# Patient Record
Sex: Male | Born: 1940 | Race: White | Hispanic: No | Marital: Married | Smoking: Former smoker
Health system: Southern US, Community
[De-identification: ages and names within clinical notes are randomized; demographics above are authoritative.]

## PROBLEM LIST (undated history)

## (undated) DIAGNOSIS — M47812 Spondylosis without myelopathy or radiculopathy, cervical region: Secondary | ICD-10-CM

## (undated) DIAGNOSIS — E66811 Obesity, class 1: Secondary | ICD-10-CM

## (undated) DIAGNOSIS — I714 Abdominal aortic aneurysm, without rupture, unspecified: Secondary | ICD-10-CM

## (undated) DIAGNOSIS — H269 Unspecified cataract: Secondary | ICD-10-CM

## (undated) DIAGNOSIS — S0572XA Avulsion of left eye, initial encounter: Secondary | ICD-10-CM

## (undated) DIAGNOSIS — E785 Hyperlipidemia, unspecified: Secondary | ICD-10-CM

## (undated) DIAGNOSIS — L570 Actinic keratosis: Secondary | ICD-10-CM

## (undated) DIAGNOSIS — Z7289 Other problems related to lifestyle: Secondary | ICD-10-CM

## (undated) DIAGNOSIS — J302 Other seasonal allergic rhinitis: Secondary | ICD-10-CM

## (undated) DIAGNOSIS — F1090 Alcohol use, unspecified, uncomplicated: Secondary | ICD-10-CM

## (undated) DIAGNOSIS — H9193 Unspecified hearing loss, bilateral: Secondary | ICD-10-CM

## (undated) DIAGNOSIS — E669 Obesity, unspecified: Secondary | ICD-10-CM

## (undated) DIAGNOSIS — Z789 Other specified health status: Secondary | ICD-10-CM

## (undated) DIAGNOSIS — T7840XA Allergy, unspecified, initial encounter: Secondary | ICD-10-CM

## (undated) HISTORY — DX: Hyperlipidemia, unspecified: E78.5

## (undated) HISTORY — DX: Obesity, class 1: E66.811

## (undated) HISTORY — DX: Other specified health status: Z78.9

## (undated) HISTORY — DX: Allergy, unspecified, initial encounter: T78.40XA

## (undated) HISTORY — DX: Spondylosis without myelopathy or radiculopathy, cervical region: M47.812

## (undated) HISTORY — PX: INGUINAL HERNIA REPAIR: SUR1180

## (undated) HISTORY — DX: Unspecified hearing loss, bilateral: H91.93

## (undated) HISTORY — DX: Obesity, unspecified: E66.9

## (undated) HISTORY — DX: Abdominal aortic aneurysm, without rupture, unspecified: I71.40

## (undated) HISTORY — DX: Other seasonal allergic rhinitis: J30.2

## (undated) HISTORY — DX: Other problems related to lifestyle: Z72.89

## (undated) HISTORY — DX: Alcohol use, unspecified, uncomplicated: F10.90

## (undated) HISTORY — DX: Actinic keratosis: L57.0

## (undated) HISTORY — DX: Avulsion of left eye, initial encounter: S05.72XA

## (undated) HISTORY — DX: Unspecified cataract: H26.9

## (undated) HISTORY — DX: Abdominal aortic aneurysm, without rupture: I71.4

---
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
## 1967-11-15 HISTORY — PX: ENUCLEATION: SHX628

## 2001-12-24 ENCOUNTER — Ambulatory Visit (HOSPITAL_COMMUNITY): Admission: RE | Admit: 2001-12-24 | Discharge: 2001-12-24 | Payer: Self-pay | Admitting: *Deleted

## 2003-11-15 HISTORY — PX: MENISCUS REPAIR: SHX5179

## 2004-01-20 ENCOUNTER — Ambulatory Visit (HOSPITAL_BASED_OUTPATIENT_CLINIC_OR_DEPARTMENT_OTHER): Admission: RE | Admit: 2004-01-20 | Discharge: 2004-01-20 | Payer: Self-pay | Admitting: Plastic Surgery

## 2004-01-20 ENCOUNTER — Ambulatory Visit (HOSPITAL_COMMUNITY): Admission: RE | Admit: 2004-01-20 | Discharge: 2004-01-20 | Payer: Self-pay | Admitting: Plastic Surgery

## 2004-01-20 ENCOUNTER — Encounter (INDEPENDENT_AMBULATORY_CARE_PROVIDER_SITE_OTHER): Payer: Self-pay | Admitting: *Deleted

## 2004-11-14 HISTORY — PX: VARICOSE VEIN SURGERY: SHX832

## 2005-04-08 ENCOUNTER — Ambulatory Visit (HOSPITAL_COMMUNITY): Admission: RE | Admit: 2005-04-08 | Discharge: 2005-04-08 | Payer: Self-pay | Admitting: Orthopedic Surgery

## 2007-04-11 ENCOUNTER — Ambulatory Visit (HOSPITAL_COMMUNITY): Admission: RE | Admit: 2007-04-11 | Discharge: 2007-04-11 | Payer: Self-pay | Admitting: *Deleted

## 2009-07-24 DIAGNOSIS — D239 Other benign neoplasm of skin, unspecified: Secondary | ICD-10-CM

## 2009-07-24 HISTORY — DX: Other benign neoplasm of skin, unspecified: D23.9

## 2010-02-02 ENCOUNTER — Encounter: Admission: RE | Admit: 2010-02-02 | Discharge: 2010-02-02 | Payer: Self-pay | Admitting: Internal Medicine

## 2010-02-05 ENCOUNTER — Ambulatory Visit: Admission: RE | Admit: 2010-02-05 | Discharge: 2010-02-05 | Payer: Self-pay | Admitting: Internal Medicine

## 2010-11-26 ENCOUNTER — Ambulatory Visit
Admission: RE | Admit: 2010-11-26 | Discharge: 2010-11-26 | Payer: Self-pay | Source: Home / Self Care | Attending: Surgery | Admitting: Surgery

## 2010-11-29 LAB — POCT HEMOGLOBIN-HEMACUE: Hemoglobin: 16.2 g/dL (ref 13.0–17.0)

## 2010-12-05 NOTE — Op Note (Signed)
NAME:  Patrick Sawyer, Patrick Sawyer          ACCOUNT NO.:  [id number]  MEDICAL RECORD NO.:  [id number]          PATIENT TYPE:  AMB  LOCATION:  DSC                          FACILITY:  [facility]  PHYSICIAN:  Velora Heckler, MD      DATE OF BIRTH:  03-21-1941  DATE OF PROCEDURE:  11/26/2010 DATE OF DISCHARGE:                              OPERATIVE REPORT   PREOPERATIVE DIAGNOSIS:  Right inguinal hernia.  POSTOPERATIVE DIAGNOSIS:  Right inguinal hernia.  PROCEDURE:  Repair of right inguinal hernia with Ethicon Ultrapro mesh.  SURGEON:  Velora Heckler, MD  ANESTHESIA:  General per Dr. Judie Petit.  ESTIMATED BLOOD LOSS:  Minimal.  PREPARATION:  ChloraPrep.  COMPLICATIONS:  None.  INDICATIONS:  The patient is a 70 year old white male from [place], [place].  He presents with right groin pain intermittently for approximately 2 years.  He was found on physical exam to have a reducible right inguinal hernia.  He now comes to surgery for repair.  BODY OF REPORT:  Procedure was done in OR #4 at the [facility].  The patient was brought to the operating room, placed in a supine position on the operating room table.  Following administration of general anesthesia, the patient was prepped and draped in the usual strict aseptic fashion.  After ascertaining that an adequate level of anesthesia had been achieved, a right groin incision was made with a #15 blade.  Dissection was carried through subcutaneous tissues and hemostasis obtained with electrocautery.  External oblique fascia was incised in line with its fibers and extended through the external inguinal ring.  Cord structures were dissected out the inguinal canal and encircled with a Penrose drain.  Floor of the inguinal canal was carefully dissected out.  Cord was explored.  There was a moderate lipoma of the cord, which was excised.  There was an indirect inguinal hernia sac, which was opened.  It contains fatty  tissue, which was reduced back within the peritoneal cavity.  Hernia sac was then closed with a high ligation with 2-0 silk suture ligature.  The sac was excised and discarded.  Next the floor of the inguinal canal was reinforced with a sheet of Ethicon Ultrapro mesh.  Mesh was cut to the appropriate dimensions.  It was secured to the pubic tubercle and along the inguinal ligament with a running 2-0 Novafil suture.  Mesh was split to accommodate the cord structures.  Superior margin of the mesh was secured to the transversalis and internal oblique fascia with interrupted 2-0 Novafil sutures.  Tails of mesh were overlapped lateral to cord structures and secured to the inguinal ligament with interrupted 2-0 Novafil sutures. Local field block was placed with Marcaine.  Cord structures were returned to the inguinal canal.  External oblique fascia was closed with interrupted 3-0 Vicryl sutures.  Subcutaneous tissues were closed with interrupted 3-0 Vicryl sutures.  Skin was anesthetized with local anesthetic.  Skin edges were reapproximated with a running 4-0 Monocryl subcuticular suture.  Wound was washed and dried and Benzoin and Steri-Strips were applied.  Sterile dressings were applied.  The patient was awakened from anesthesia and brought to  the recovery room.  The patient tolerated the procedure well.     Velora Heckler, MD     TMG/MEDQ  D:  11/26/2010  T:  11/27/2010  Job:  [id number]  Electronically Signed by Darnell Level MD on 12/05/2010 10:00:40 AM

## 2011-01-31 ENCOUNTER — Other Ambulatory Visit: Payer: Self-pay | Admitting: Internal Medicine

## 2011-01-31 DIAGNOSIS — R911 Solitary pulmonary nodule: Secondary | ICD-10-CM

## 2011-02-07 ENCOUNTER — Ambulatory Visit
Admission: RE | Admit: 2011-02-07 | Discharge: 2011-02-07 | Disposition: A | Discharge status: Home / Self Care | Payer: Medicare Other | Source: Ambulatory Visit | Attending: Internal Medicine | Admitting: Internal Medicine

## 2011-02-07 DIAGNOSIS — R911 Solitary pulmonary nodule: Secondary | ICD-10-CM

## 2011-03-29 NOTE — Op Note (Signed)
NAME:  Patrick Sawyer, Patrick Sawyer NO.:  [id number]   MEDICAL RECORD NO.:  [id number]          PATIENT TYPE:  AMB   LOCATION:  ENDO                         FACILITY:  [facility]   PHYSICIAN:  Georgiana Spinner, M.D.    DATE OF BIRTH:  18-May-1941   DATE OF PROCEDURE:  04/11/2007  DATE OF DISCHARGE:                               OPERATIVE REPORT   PROCEDURE:  Colonoscopy.   INDICATIONS:  Colon cancer screening.   ANESTHESIA:  Demerol 80, Versed 10 mg.   PROCEDURE:  With the patient mildly sedated in the left lateral  decubitus position the Pentax videoscopic colonoscope was inserted the  rectum after normal rectal examination was performed and passed under  direct vision to the cecum identified by the ileocecal valve and  appendiceal orifice both of which were photographed.  From this point  the colonoscope was slowly withdrawn taking circumferential views of  colonic mucosa stopping only in the rectum which appeared normal on  direct and showed hemorrhoids on retroflexed view.  The endoscope was  straightened and withdrawn.  The patient's vital signs, pulse oximeter  remained stable.  The patient tolerated procedure well without apparent  complications.   FINDINGS:  Internal hemorrhoids otherwise unremarkable exam.   PLAN:  Repeat examination in 5 years.           ______________________________  Georgiana Spinner, M.D.     GMO/MEDQ  D:  04/11/2007  T:  04/11/2007  Job:  [id number]

## 2011-04-01 NOTE — Op Note (Signed)
NAME:  Patrick Sawyer, Patrick Sawyer                    ACCOUNT NO.:  [id number]   MEDICAL RECORD NO.:  [id number]                   PATIENT TYPE:  AMB   LOCATION:  DSC                                  FACILITY:  [facility]   PHYSICIAN:  Brantley Persons, M.D.             DATE OF BIRTH:  28-Oct-1941   DATE OF PROCEDURE:  01/20/2004  DATE OF DISCHARGE:                                 OPERATIVE REPORT   PREOPERATIVE DIAGNOSIS:  1. Suspicious skin lesion upper left nasal sidewall.  2. Recurrent, suspicious skin lesion left distal forearm.   POSTOPERATIVE DIAGNOSIS:  1. Suspicious skin lesion upper left nasal sidewall.  2. Recurrent, suspicious skin lesion left distal forearm.   PROCEDURE:  1. Excision of 0.6 cm suspicious skin lesion upper left nasal sidewall with     interoperative frozen section diagnosis.  2. Excision of 1 cm recurrent suspicious skin lesion left distal forearm.   SURGEON:  Mary A. Contogiannis, M.D.   ANESTHESIA:  1% lidocaine with epinephrine.   COMPLICATIONS:  None.   INDICATIONS FOR PROCEDURE:  The patient is a 70 year old Caucasian male who  had a skin lesion present along the upper aspect of the left nasal sidewall  area that has been present for about one year.  This initially started as a  bump that he thought was due to his glasses running against it.  However,  over the past few months, it has been growing larger.  Additionally, he also  has a skin lesion present at the left distal forarm area proximal to the  wrist.  This lesion was frozen off two years ago but has recurred and is not  healing.  He would like to undergo excision of both these lesions.   PROCEDURE:  The patient was brought into the minor room and placed on the  table in supine position.  The nose and face were prepped with Betadine and  draped in a sterile fashion.  The skin and subcutaneous tissues in the area  of the suspicious skin lesion were then injected with 1% lidocaine with  epinephrine.  After adequate hemostasis and anesthesia had taken effect, the  procedure was begun. Using loupe magnification, the suspicious skin lesion  was identified.  1 mm margins were marked circumferentially around this.  The lesion was then excised full thickness through the skin into the  subcutaneous tissue, marked at the 12 o'clock position with a 6-0 silk  suture, and passed off the table to undergo interoperative frozen section  diagnosis.  Dr. Almyra Free reads the specimen while I was on standby.  She felt  that there was actinic change present with some dysplasia, however, there  was no frank carcinoma in situ or carcinoma present.  The skin edges were  then undermined for easier closure.  The incision was closed using 4-0  Monocryl in the dermal layer followed by a 6-0 Prolene running baseball type  suture.   Attention was turned to  the left forearm.  The skin over the mass was  prepped with Betadine and draped in a sterile fashion.  The skin and  subcutaneous tissues in the area of the soft tissue mass were then injected  with 1% lidocaine with epinephrine.  After adequate hemostasis and  anesthesia had taken effect, the procedure was begun. At first we thought  this might, perhaps, be a sebaceous cyst.  However, after the incision was  made over the prominent aspect of the mass, it was apparent that there was a  solid structure present and it was not a cyst.  I, therefore, discussed this  with the patient and I proceeded with excision of the entire skin lesion.  Using loupe magnification, the skin edges of the lesion were identified.  1  mm margins were marked circumferentially around them.  The lesion was then  excised full thickness through the skin into the subcutaneous tissues.  The  specimen was then marked at the 12 o'clock position and passed off the table  to undergo permanent pathologic section diagnosis.  There might possibly  have been a small amount of dermis left  behind and so the deep margin was re-  excised and inked on the inferior aspect.  We will send this to the  pathologist so that they could use it only if they feel they need to.  I  then proceeded with closure of the wound.  The skin edges were then  undermined for easier closure.  The dermal layer was closed with 4-0  Monocryl suture.  The skin was closed with a 6-0 Prolene running baseball  type suture.  Both incisions were cleansed with saline and then dressed with  Bacitracin ointment. Additionally, the left arm dressing was dressed with a  4 by 4 and Kling dressing.  There were no complications.  The patient  tolerated the procedure well.  He was then given proper postoperative wound  care instructions and discharged home in stable condition.  Follow up  appointment will be tomorrow in the office.                                               Brantley Persons, M.D.    MC/MEDQ  D:  01/20/2004  T:  01/20/2004  Job:  [id number]

## 2011-04-01 NOTE — Procedures (Signed)
[facility]  Patient:    Patrick Sawyer, Patrick Sawyer Visit Number: [id number] MRN: [id number]          Service Type: END Location: ENDO Attending Physician:  Sabino Gasser Dictated by:   Sabino Gasser, M.D. Proc. Date: 12/24/01 Admit Date:  12/24/2001                             Procedure Report  PROCEDURE:  Colonoscopy.  INDICATION:  Colon cancer screening and family history of colon cancer.  ANESTHESIA:  Demerol 100, Versed 9 mg.  DESCRIPTION OF PROCEDURE:  With the patient mildly sedated in the left lateral decubitus position, the Olympus videoscopic colonoscope was inserted into the rectum after a normal rectal exam and passed under direct vision to the cecum, identified by the ileocecal valve and appendiceal orifice, both of which were photographed.  From this point the colonoscope was slowly withdrawn, taking circumferential views of the entire colonic mucosa, stopping only then in the rectum which appeared normal on direct view and showed hemorrhoids on retroflexed view.  The endoscope was straightened and withdrawn.  The patients vital signs and pulse oximeter remained stable.  The patient tolerated the procedure well without apparent complications.  FINDINGS: 1. Internal hemorrhoids. 2. Otherwise an unremarkable examination.  PLAN:  Repeat examination in five years. Dictated by:   Sabino Gasser, M.D. Attending Physician:  Sabino Gasser DD:  12/24/01 TD:  12/24/01 Job: [id number]

## 2011-07-01 IMAGING — CT CT CHEST W/ CM
2 of 5 series · 14 of 30 positions shown, 16 images · IV contrast (75CC OMNI 300)
Comparison: None

CLINICAL DATA: Pulmonary fibrosis on chest x-ray, evaluate for
interstitial lung disease

CT CHEST WITH CONTRAST
TECHNIQUE: Multidetector CT imaging of the chest was performed
following the standard protocol during bolus administration of
intravenous contrast.
Contrast: 75 ml [id number]

[Series 3: routine chest · axial · 0.68mm/px · z∈[-367,-127]mm · 6 of 68 slices shown, 8 images]
[im 10/68  mediastinal]
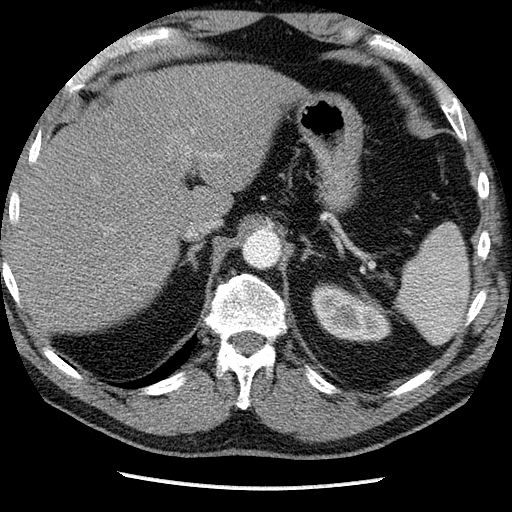
[im 10/68  lung]
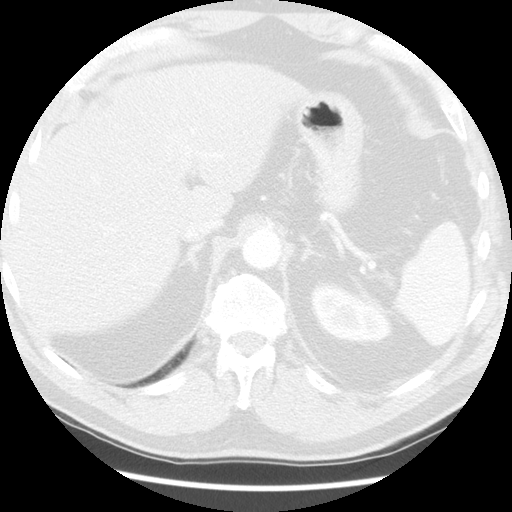
[im 20/68  lung]
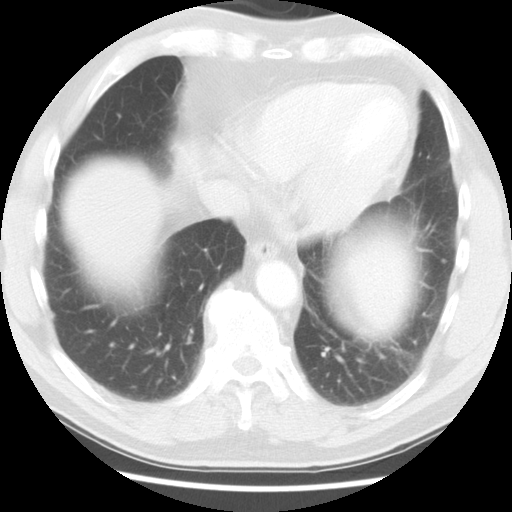
[im 29/68  lung]
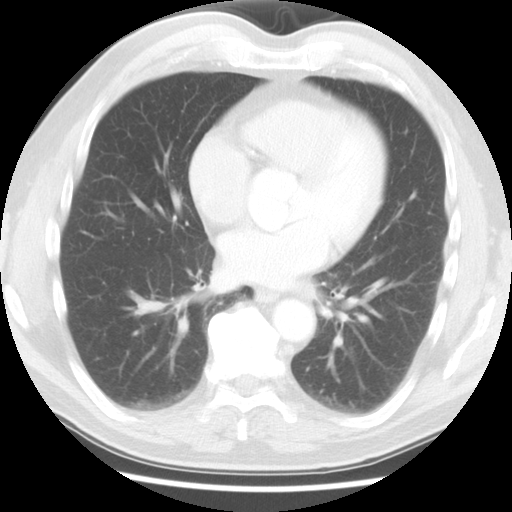
[im 39/68  lung]
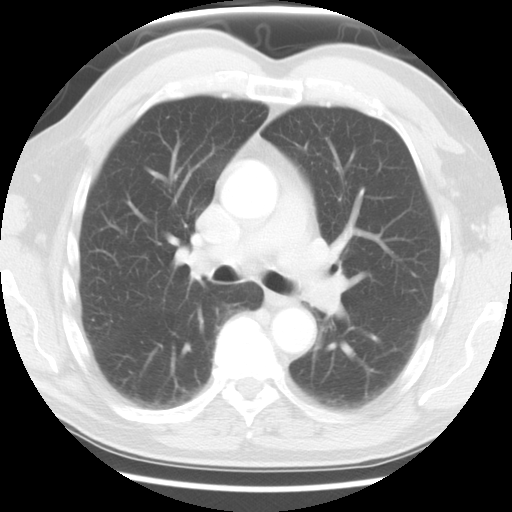
[im 48/68  mediastinal]
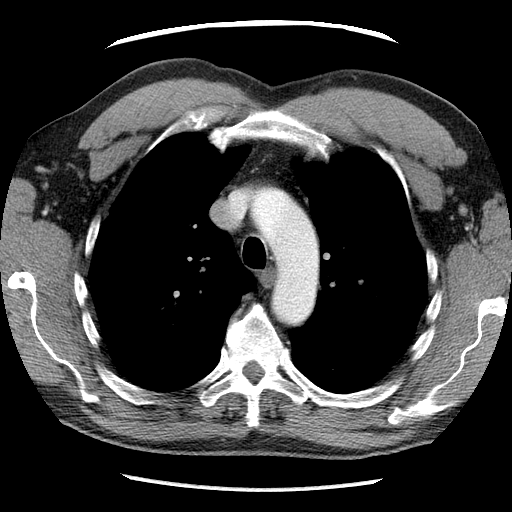
[im 48/68  lung]
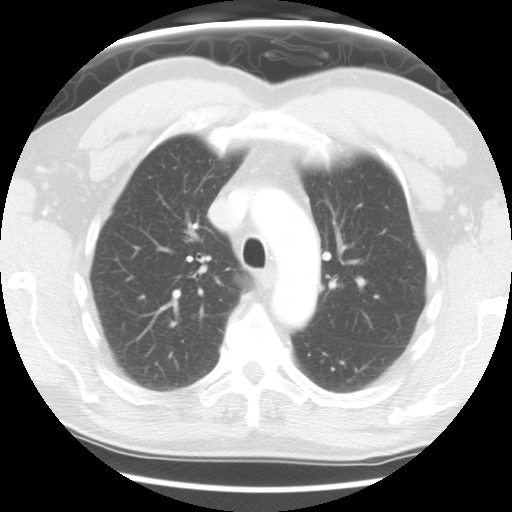
[im 58/68  lung]
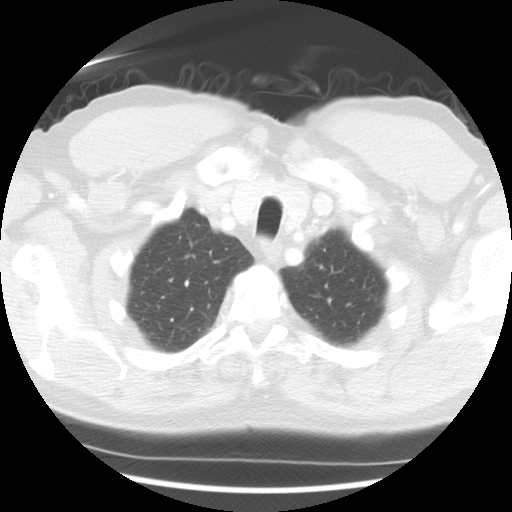

[Series 602: sagittal body · sagittal · 0.68mm/px · 8 of 141 slices shown]
[im 17/141  mediastinal]
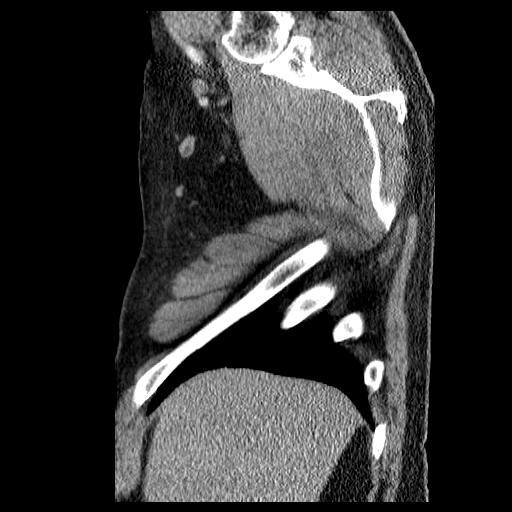
[im 33/141  mediastinal]
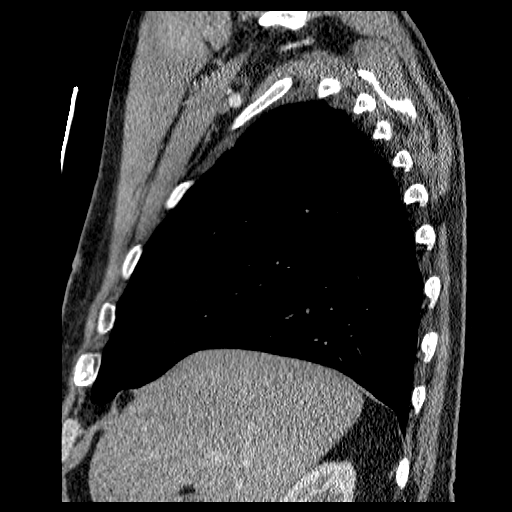
[im 50/141  mediastinal]
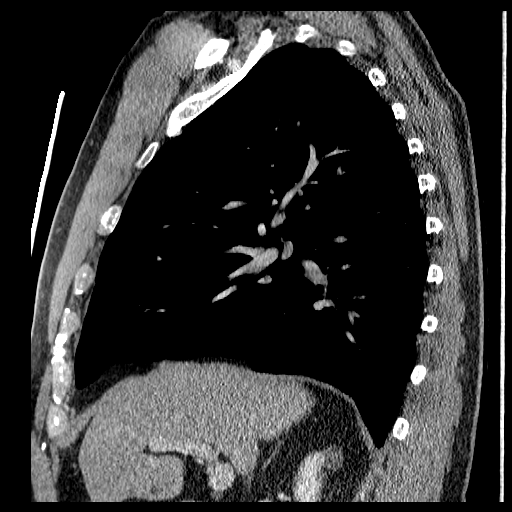
[im 66/141  mediastinal]
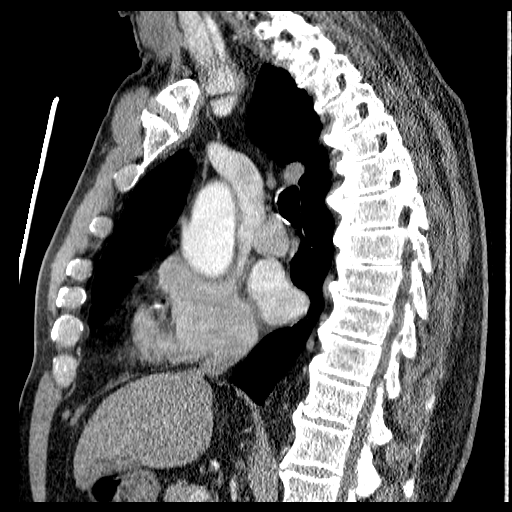
[im 83/141  mediastinal]
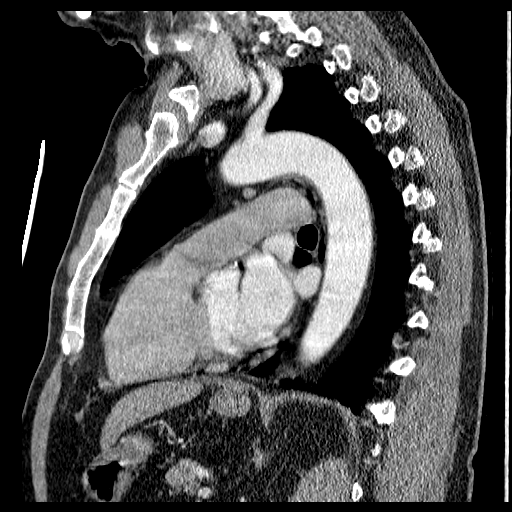
[im 99/141  mediastinal]
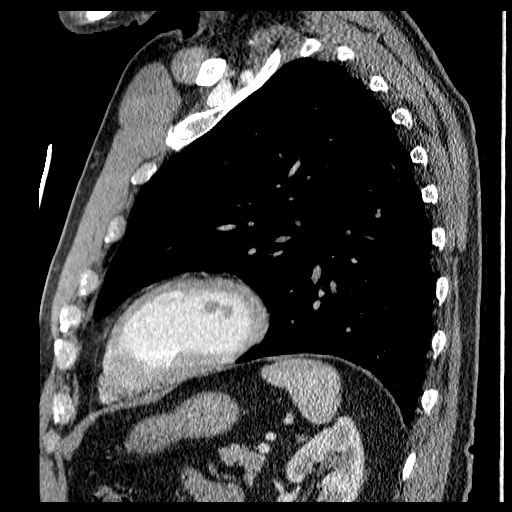
[im 116/141  mediastinal]
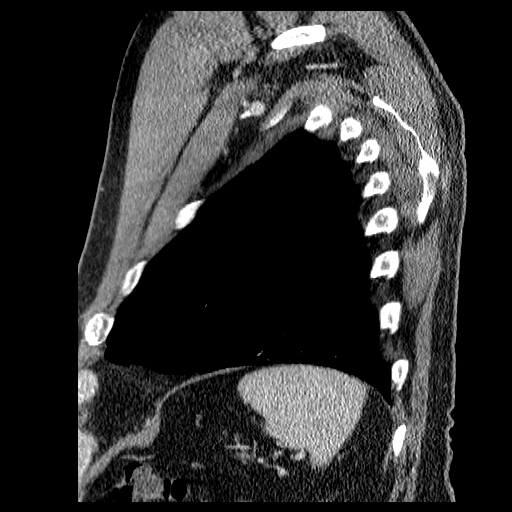
[im 132/141  mediastinal]
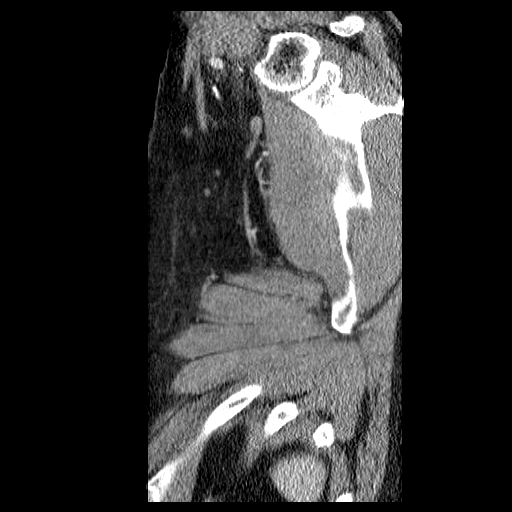

[14 of 30 positions shown; findings below may reference images not displayed]

FINDINGS: There is mild bibasilar linear atelectasis or scarring
present.  However there is no evidence of interstitial lung disease
throughout the lungs.  No pleural effusion is seen and no focal
infiltrate is noted. Two small noncalcified 4- 5 mm nodules are
noted within the periphery of the left lower lobe on images 47 and
48. If the patient is at high risk for bronchogenic carcinoma,
follow-up chest CT at 1 year is recommended.  If the patient is at
low risk, no follow-up is needed.  This recommendation follows the
consensus statement: "Guidelines for Management of Small Pulmonary
Nodules Detected on CT Scans:  A Statement from the [employer]
online at:  [URL]

On soft tissue window images, the thyroid gland is within normal
limits in size.  No mediastinal or hilar adenopathy is seen.  The
pulmonary arteries and thoracic aorta opacify with no significant
abnormality noted.  Faint coronary artery calcification is noted
and there is mild cardiomegaly present.  A few scattered low
attenuation liver lesions most likely are benign.
IMPRESSION: 1.  Mild bibasilar linear atelectasis or scarring.  No evidence of
interstitial lung disease.
2.  Two small noncalcified 4-5 mm nodules in the left lower lobe
peripherally.  Followup recommendations given above.

## 2011-11-21 DIAGNOSIS — M5030 Other cervical disc degeneration, unspecified cervical region: Secondary | ICD-10-CM | POA: Diagnosis not present

## 2011-11-21 DIAGNOSIS — M999 Biomechanical lesion, unspecified: Secondary | ICD-10-CM | POA: Diagnosis not present

## 2011-11-21 DIAGNOSIS — M9981 Other biomechanical lesions of cervical region: Secondary | ICD-10-CM | POA: Diagnosis not present

## 2011-11-21 DIAGNOSIS — M546 Pain in thoracic spine: Secondary | ICD-10-CM | POA: Diagnosis not present

## 2011-11-23 DIAGNOSIS — M9981 Other biomechanical lesions of cervical region: Secondary | ICD-10-CM | POA: Diagnosis not present

## 2011-11-23 DIAGNOSIS — M5030 Other cervical disc degeneration, unspecified cervical region: Secondary | ICD-10-CM | POA: Diagnosis not present

## 2011-11-23 DIAGNOSIS — M546 Pain in thoracic spine: Secondary | ICD-10-CM | POA: Diagnosis not present

## 2011-11-23 DIAGNOSIS — M999 Biomechanical lesion, unspecified: Secondary | ICD-10-CM | POA: Diagnosis not present

## 2011-11-24 DIAGNOSIS — M5030 Other cervical disc degeneration, unspecified cervical region: Secondary | ICD-10-CM | POA: Diagnosis not present

## 2011-11-24 DIAGNOSIS — M546 Pain in thoracic spine: Secondary | ICD-10-CM | POA: Diagnosis not present

## 2011-11-24 DIAGNOSIS — M999 Biomechanical lesion, unspecified: Secondary | ICD-10-CM | POA: Diagnosis not present

## 2011-11-24 DIAGNOSIS — M9981 Other biomechanical lesions of cervical region: Secondary | ICD-10-CM | POA: Diagnosis not present

## 2011-11-28 DIAGNOSIS — M546 Pain in thoracic spine: Secondary | ICD-10-CM | POA: Diagnosis not present

## 2011-11-28 DIAGNOSIS — M5030 Other cervical disc degeneration, unspecified cervical region: Secondary | ICD-10-CM | POA: Diagnosis not present

## 2011-11-28 DIAGNOSIS — M999 Biomechanical lesion, unspecified: Secondary | ICD-10-CM | POA: Diagnosis not present

## 2011-11-28 DIAGNOSIS — M9981 Other biomechanical lesions of cervical region: Secondary | ICD-10-CM | POA: Diagnosis not present

## 2012-01-30 DIAGNOSIS — E7800 Pure hypercholesterolemia, unspecified: Secondary | ICD-10-CM | POA: Diagnosis not present

## 2012-01-30 DIAGNOSIS — R5381 Other malaise: Secondary | ICD-10-CM | POA: Diagnosis not present

## 2012-02-02 DIAGNOSIS — Z0000 Encounter for general adult medical examination without abnormal findings: Secondary | ICD-10-CM | POA: Diagnosis not present

## 2012-02-02 DIAGNOSIS — R413 Other amnesia: Secondary | ICD-10-CM | POA: Diagnosis not present

## 2012-02-02 DIAGNOSIS — E7800 Pure hypercholesterolemia, unspecified: Secondary | ICD-10-CM | POA: Diagnosis not present

## 2012-02-12 ENCOUNTER — Emergency Department: Payer: Self-pay | Admitting: Emergency Medicine

## 2012-02-12 DIAGNOSIS — S63509A Unspecified sprain of unspecified wrist, initial encounter: Secondary | ICD-10-CM | POA: Diagnosis not present

## 2012-02-12 DIAGNOSIS — Z79899 Other long term (current) drug therapy: Secondary | ICD-10-CM | POA: Diagnosis not present

## 2012-02-12 DIAGNOSIS — M25539 Pain in unspecified wrist: Secondary | ICD-10-CM | POA: Diagnosis not present

## 2012-02-12 DIAGNOSIS — E785 Hyperlipidemia, unspecified: Secondary | ICD-10-CM | POA: Diagnosis not present

## 2012-02-12 DIAGNOSIS — M79609 Pain in unspecified limb: Secondary | ICD-10-CM | POA: Diagnosis not present

## 2012-02-12 DIAGNOSIS — M7989 Other specified soft tissue disorders: Secondary | ICD-10-CM | POA: Diagnosis not present

## 2012-03-08 DIAGNOSIS — L821 Other seborrheic keratosis: Secondary | ICD-10-CM | POA: Diagnosis not present

## 2012-03-08 DIAGNOSIS — L738 Other specified follicular disorders: Secondary | ICD-10-CM | POA: Diagnosis not present

## 2012-04-18 DIAGNOSIS — H43819 Vitreous degeneration, unspecified eye: Secondary | ICD-10-CM | POA: Diagnosis not present

## 2012-06-14 HISTORY — PX: COLONOSCOPY: SHX174

## 2012-07-05 ENCOUNTER — Ambulatory Visit: Payer: Self-pay | Admitting: Unknown Physician Specialty

## 2012-07-05 DIAGNOSIS — E785 Hyperlipidemia, unspecified: Secondary | ICD-10-CM | POA: Diagnosis not present

## 2012-07-05 DIAGNOSIS — M1990 Unspecified osteoarthritis, unspecified site: Secondary | ICD-10-CM | POA: Diagnosis not present

## 2012-07-05 DIAGNOSIS — D371 Neoplasm of uncertain behavior of stomach: Secondary | ICD-10-CM | POA: Diagnosis not present

## 2012-07-05 DIAGNOSIS — Z6831 Body mass index (BMI) 31.0-31.9, adult: Secondary | ICD-10-CM | POA: Diagnosis not present

## 2012-07-05 DIAGNOSIS — Z8042 Family history of malignant neoplasm of prostate: Secondary | ICD-10-CM | POA: Diagnosis not present

## 2012-07-05 DIAGNOSIS — Z1211 Encounter for screening for malignant neoplasm of colon: Secondary | ICD-10-CM | POA: Diagnosis not present

## 2012-07-05 DIAGNOSIS — D375 Neoplasm of uncertain behavior of rectum: Secondary | ICD-10-CM | POA: Diagnosis not present

## 2012-07-05 DIAGNOSIS — Z79899 Other long term (current) drug therapy: Secondary | ICD-10-CM | POA: Diagnosis not present

## 2012-07-05 DIAGNOSIS — Z800 Family history of malignant neoplasm of digestive organs: Secondary | ICD-10-CM | POA: Diagnosis not present

## 2012-07-05 DIAGNOSIS — Z87891 Personal history of nicotine dependence: Secondary | ICD-10-CM | POA: Diagnosis not present

## 2012-07-05 DIAGNOSIS — Z7982 Long term (current) use of aspirin: Secondary | ICD-10-CM | POA: Diagnosis not present

## 2012-07-05 DIAGNOSIS — Z86010 Personal history of colonic polyps: Secondary | ICD-10-CM | POA: Diagnosis not present

## 2012-07-05 DIAGNOSIS — D126 Benign neoplasm of colon, unspecified: Secondary | ICD-10-CM | POA: Diagnosis not present

## 2012-07-05 DIAGNOSIS — D378 Neoplasm of uncertain behavior of other specified digestive organs: Secondary | ICD-10-CM | POA: Diagnosis not present

## 2012-07-05 DIAGNOSIS — K648 Other hemorrhoids: Secondary | ICD-10-CM | POA: Diagnosis not present

## 2012-07-05 DIAGNOSIS — Z8249 Family history of ischemic heart disease and other diseases of the circulatory system: Secondary | ICD-10-CM | POA: Diagnosis not present

## 2012-07-06 LAB — PATHOLOGY REPORT

## 2012-08-02 DIAGNOSIS — R413 Other amnesia: Secondary | ICD-10-CM | POA: Diagnosis not present

## 2012-08-02 DIAGNOSIS — E7800 Pure hypercholesterolemia, unspecified: Secondary | ICD-10-CM | POA: Diagnosis not present

## 2012-08-03 DIAGNOSIS — R413 Other amnesia: Secondary | ICD-10-CM | POA: Diagnosis not present

## 2012-08-06 DIAGNOSIS — E7800 Pure hypercholesterolemia, unspecified: Secondary | ICD-10-CM | POA: Diagnosis not present

## 2012-08-06 DIAGNOSIS — Z23 Encounter for immunization: Secondary | ICD-10-CM | POA: Diagnosis not present

## 2012-08-23 DIAGNOSIS — R21 Rash and other nonspecific skin eruption: Secondary | ICD-10-CM | POA: Diagnosis not present

## 2012-08-23 DIAGNOSIS — B359 Dermatophytosis, unspecified: Secondary | ICD-10-CM | POA: Diagnosis not present

## 2012-08-23 DIAGNOSIS — B354 Tinea corporis: Secondary | ICD-10-CM | POA: Diagnosis not present

## 2012-10-30 DIAGNOSIS — K922 Gastrointestinal hemorrhage, unspecified: Secondary | ICD-10-CM | POA: Diagnosis not present

## 2013-01-30 DIAGNOSIS — Z0000 Encounter for general adult medical examination without abnormal findings: Secondary | ICD-10-CM | POA: Diagnosis not present

## 2013-01-30 DIAGNOSIS — E7800 Pure hypercholesterolemia, unspecified: Secondary | ICD-10-CM | POA: Diagnosis not present

## 2013-01-30 DIAGNOSIS — Z125 Encounter for screening for malignant neoplasm of prostate: Secondary | ICD-10-CM | POA: Diagnosis not present

## 2013-03-07 DIAGNOSIS — E785 Hyperlipidemia, unspecified: Secondary | ICD-10-CM | POA: Diagnosis not present

## 2013-03-07 DIAGNOSIS — Z0000 Encounter for general adult medical examination without abnormal findings: Secondary | ICD-10-CM | POA: Diagnosis not present

## 2013-03-07 DIAGNOSIS — K649 Unspecified hemorrhoids: Secondary | ICD-10-CM | POA: Diagnosis not present

## 2013-03-13 DIAGNOSIS — L819 Disorder of pigmentation, unspecified: Secondary | ICD-10-CM | POA: Diagnosis not present

## 2013-03-13 DIAGNOSIS — L821 Other seborrheic keratosis: Secondary | ICD-10-CM | POA: Diagnosis not present

## 2013-03-13 DIAGNOSIS — D239 Other benign neoplasm of skin, unspecified: Secondary | ICD-10-CM | POA: Diagnosis not present

## 2013-03-13 DIAGNOSIS — D1800 Hemangioma unspecified site: Secondary | ICD-10-CM | POA: Diagnosis not present

## 2013-04-02 DIAGNOSIS — H25049 Posterior subcapsular polar age-related cataract, unspecified eye: Secondary | ICD-10-CM | POA: Diagnosis not present

## 2013-07-10 IMAGING — CR RIGHT THUMB 2+V
1 series · 3 of 3 positions shown · non-contrast
Comparison: none

REASON FOR EXAM: pain
COMMENTS:

PROCEDURE:     DXR - DXR THUMB RIGHT HAND (1ST DIGIT)  - February 12, 2012  [DATE]
RESULT:     Images of the right thumb demonstrate degenerative change and
mild osteopenia. There is no definite fracture, dislocation or foreign body.

[Series 1: x finger lat right · 0.14mm/px · 3 of 3 slices shown]
[im 1/3]
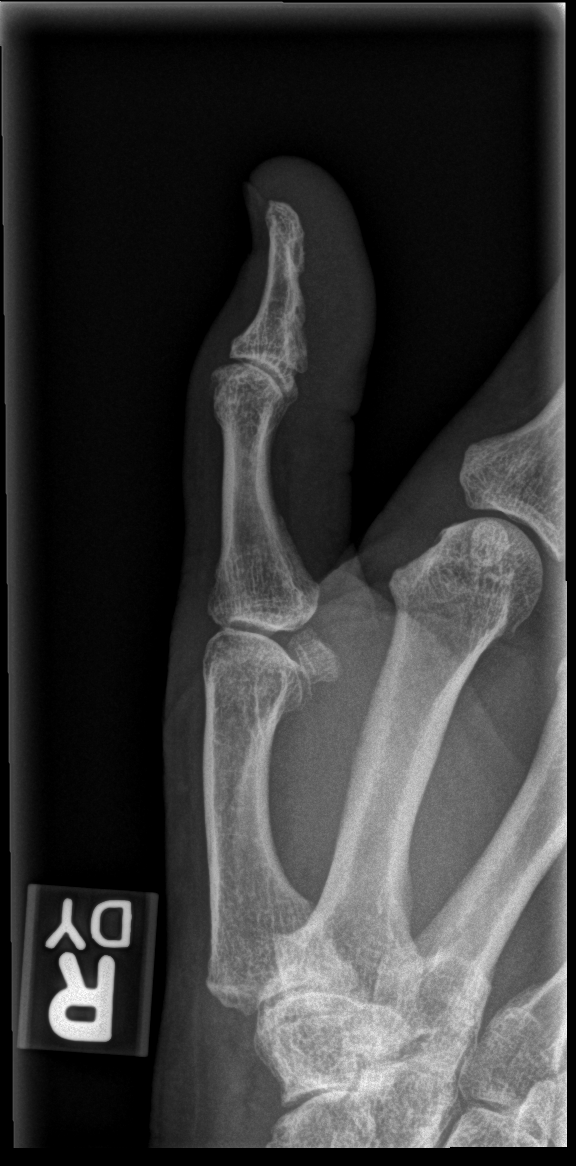
[im 2/3]
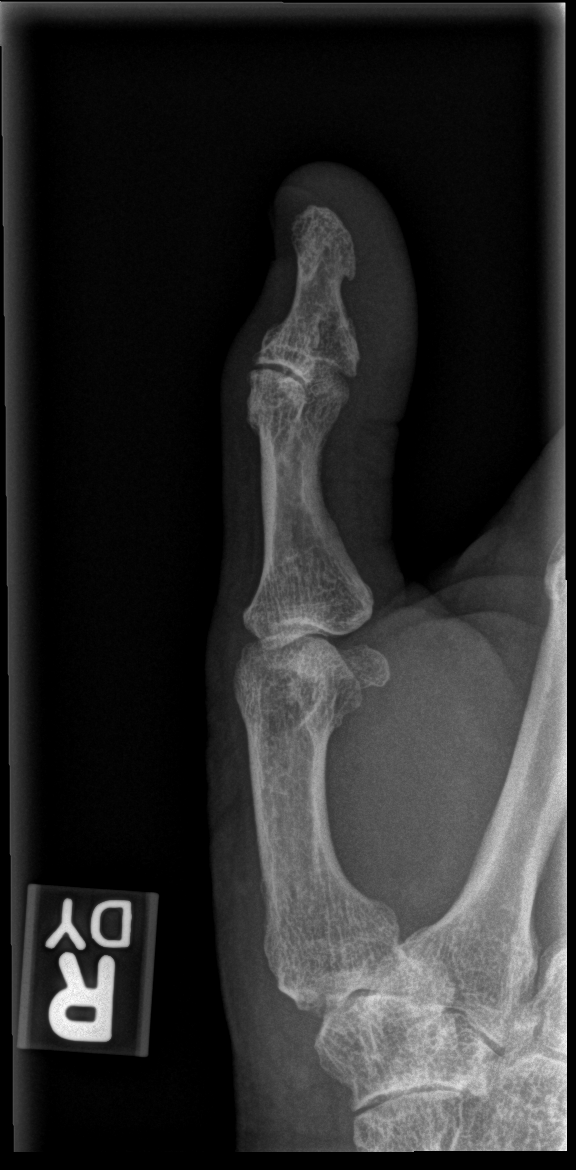
[im 3/3]
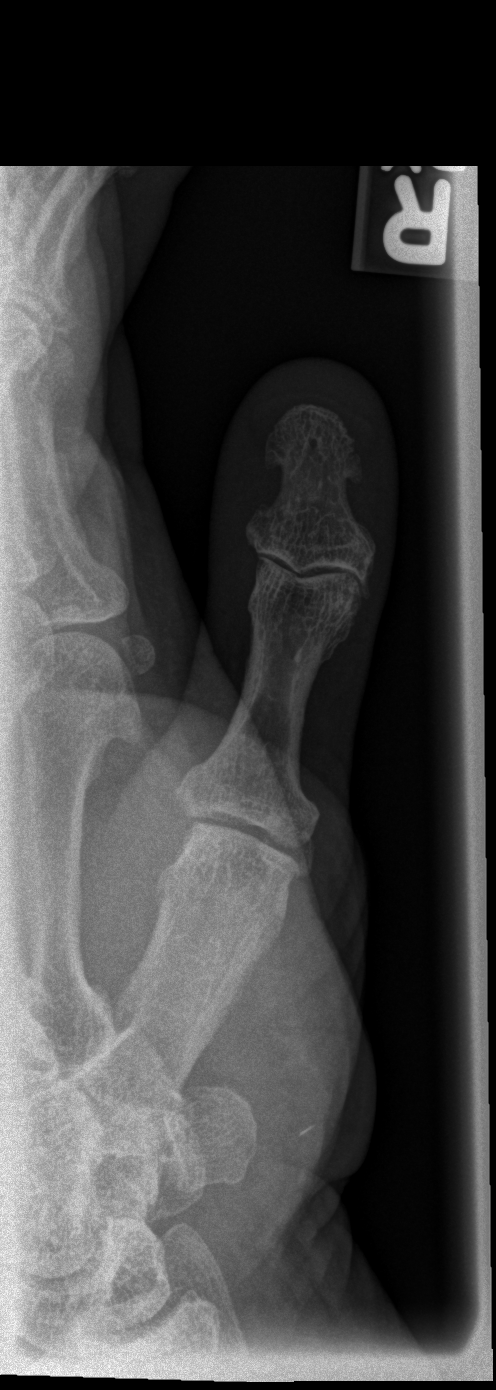

[3 of 3 positions shown; findings below may reference images not displayed]

IMPRESSION: 1. No acute bony abnormality evident.

## 2013-09-03 DIAGNOSIS — Z79899 Other long term (current) drug therapy: Secondary | ICD-10-CM | POA: Diagnosis not present

## 2013-09-03 DIAGNOSIS — E7800 Pure hypercholesterolemia, unspecified: Secondary | ICD-10-CM | POA: Diagnosis not present

## 2014-03-03 DIAGNOSIS — C4491 Basal cell carcinoma of skin, unspecified: Secondary | ICD-10-CM

## 2014-03-03 HISTORY — DX: Basal cell carcinoma of skin, unspecified: C44.91

## 2014-03-10 DIAGNOSIS — Z125 Encounter for screening for malignant neoplasm of prostate: Secondary | ICD-10-CM | POA: Diagnosis not present

## 2014-03-10 DIAGNOSIS — Z0000 Encounter for general adult medical examination without abnormal findings: Secondary | ICD-10-CM | POA: Diagnosis not present

## 2014-03-10 DIAGNOSIS — E7800 Pure hypercholesterolemia, unspecified: Secondary | ICD-10-CM | POA: Diagnosis not present

## 2014-03-13 DIAGNOSIS — C44211 Basal cell carcinoma of skin of unspecified ear and external auricular canal: Secondary | ICD-10-CM | POA: Diagnosis not present

## 2014-03-13 DIAGNOSIS — M169 Osteoarthritis of hip, unspecified: Secondary | ICD-10-CM | POA: Diagnosis not present

## 2014-03-13 DIAGNOSIS — M5380 Other specified dorsopathies, site unspecified: Secondary | ICD-10-CM | POA: Diagnosis not present

## 2014-03-13 DIAGNOSIS — L821 Other seborrheic keratosis: Secondary | ICD-10-CM | POA: Diagnosis not present

## 2014-03-13 DIAGNOSIS — D485 Neoplasm of uncertain behavior of skin: Secondary | ICD-10-CM | POA: Diagnosis not present

## 2014-03-13 DIAGNOSIS — Z0000 Encounter for general adult medical examination without abnormal findings: Secondary | ICD-10-CM | POA: Diagnosis not present

## 2014-03-13 DIAGNOSIS — M47817 Spondylosis without myelopathy or radiculopathy, lumbosacral region: Secondary | ICD-10-CM | POA: Diagnosis not present

## 2014-03-13 DIAGNOSIS — K649 Unspecified hemorrhoids: Secondary | ICD-10-CM | POA: Diagnosis not present

## 2014-03-13 DIAGNOSIS — M1610 Unilateral primary osteoarthritis, unspecified hip: Secondary | ICD-10-CM | POA: Diagnosis not present

## 2014-03-13 DIAGNOSIS — I8390 Asymptomatic varicose veins of unspecified lower extremity: Secondary | ICD-10-CM | POA: Diagnosis not present

## 2014-03-13 DIAGNOSIS — Z1331 Encounter for screening for depression: Secondary | ICD-10-CM | POA: Diagnosis not present

## 2014-03-20 ENCOUNTER — Other Ambulatory Visit: Payer: Self-pay | Admitting: Internal Medicine

## 2014-03-20 DIAGNOSIS — M25551 Pain in right hip: Secondary | ICD-10-CM

## 2014-04-14 DIAGNOSIS — C44211 Basal cell carcinoma of skin of unspecified ear and external auricular canal: Secondary | ICD-10-CM | POA: Diagnosis not present

## 2014-04-14 HISTORY — PX: MOHS SURGERY: SUR867

## 2014-04-21 ENCOUNTER — Ambulatory Visit
Admission: RE | Admit: 2014-04-21 | Discharge: 2014-04-21 | Disposition: A | Discharge status: Home / Self Care | Payer: Federal, State, Local not specified - PPO | Source: Ambulatory Visit | Attending: Internal Medicine | Admitting: Internal Medicine

## 2014-04-21 DIAGNOSIS — M25551 Pain in right hip: Secondary | ICD-10-CM

## 2014-04-21 DIAGNOSIS — M6740 Ganglion, unspecified site: Secondary | ICD-10-CM | POA: Diagnosis not present

## 2014-04-21 DIAGNOSIS — M1610 Unilateral primary osteoarthritis, unspecified hip: Secondary | ICD-10-CM | POA: Diagnosis not present

## 2014-04-21 DIAGNOSIS — M169 Osteoarthritis of hip, unspecified: Secondary | ICD-10-CM | POA: Diagnosis not present

## 2014-07-10 ENCOUNTER — Ambulatory Visit (INDEPENDENT_AMBULATORY_CARE_PROVIDER_SITE_OTHER): Payer: Medicare Other | Admitting: Podiatry

## 2014-07-10 ENCOUNTER — Encounter: Payer: Self-pay | Admitting: Podiatry

## 2014-07-10 ENCOUNTER — Ambulatory Visit (INDEPENDENT_AMBULATORY_CARE_PROVIDER_SITE_OTHER): Payer: Medicare Other

## 2014-07-10 VITALS — BP 149/84 | HR 49 | Resp 16 | Ht 74.0 in | Wt 235.0 lb

## 2014-07-10 DIAGNOSIS — L608 Other nail disorders: Secondary | ICD-10-CM | POA: Diagnosis not present

## 2014-07-10 DIAGNOSIS — M79609 Pain in unspecified limb: Secondary | ICD-10-CM

## 2014-07-10 DIAGNOSIS — M7742 Metatarsalgia, left foot: Secondary | ICD-10-CM

## 2014-07-10 DIAGNOSIS — H40009 Preglaucoma, unspecified, unspecified eye: Secondary | ICD-10-CM | POA: Diagnosis not present

## 2014-07-10 DIAGNOSIS — M7750 Other enthesopathy of unspecified foot: Secondary | ICD-10-CM | POA: Diagnosis not present

## 2014-07-10 DIAGNOSIS — M779 Enthesopathy, unspecified: Secondary | ICD-10-CM

## 2014-07-10 DIAGNOSIS — B351 Tinea unguium: Secondary | ICD-10-CM | POA: Diagnosis not present

## 2014-07-10 NOTE — Progress Notes (Signed)
   Subjective:    Patient ID: Patrick Sawyer, male    DOB: 1941/02/02, 73 y.o.   MRN: [id number]  HPI Comments: Patient presents the office today with multiple complaints. First he states that his orthotics for approximately 73 years of age and he was wondering if it was time for some new ones. States his orthotics currently still fit and comfortable. He does state when he is not wear the orthotics he starts to get hip and knee pain. Secondly he states that he has had fungal nails for which he has tried multiple over-the-counter therapies. He states that he currently does not think he has fungus but was wondering if there is anything we can do for the appearance of the nails. He denies any pain around the nails or any previous or current signs of infection. He states that he does not his nails trimmed today. Lastly he states that over the last 3-4 months he has noticed a sensitive area on the plantar aspect of his left foot in the ball of the foot. He denies any history of injury to the area. The pain is intermittent and is worse when walking barefoot or on a hard surface. No other complaints at this time.   Foot Pain      Review of Systems  All other systems reviewed and are negative.      Objective:   Physical Exam AAO x3, NAD DP/PT pulses palpable b/l. CRT < 3sec. +pedal hair Protective sensation intact with Derrel Nip monofilament, vibratory sensation intact. Nails are elongated, dystrophic, yellow brown discoloration without any clinical signs of infection. No open lesions. Mild tenderness over the left foot over the metatarsal areas mostly over the third metatarsal head. Metatarsal head does feel prominent and there is a loss of fat pad plantarly. ROM WNL, MMt 5/5 No leg pain/edema/warmth      Assessment & Plan:  73 year old male with metatarsalgia left foot due to loss of fat pad and  likely onychomycosis -X-rays were obtained and reviewed with the patient. See x-ray  report for full details. No acute fracture. Surgical versus conservative treatment discussed the patient in detail including alternatives, risks, complications were his issues. -At this time it does appear that the nails still has fungus. Due to this the biopsy the nail was obtained and sent to [facility] labs for evaluation. Discussed various fungal nail treatments if the cultures reveal onychomycosis or if negative possible nuvail.  -This time the actual orthotic portion appears to be doing well. The top layer of the orthotic has been warned and is cracking. Patient elects to proceed only with recovery of the orthotics did not U. orthotic itself. Orthotics are shipped to [facility] for recovering. -Discussed metatarsal pads to help offload the metatarsal head. He did not want them built into the orthotic but rather applied on the outside so that they can be adjusted.  -F/U after the nail biopsy are obtained or when the orthotics arrive- call sooner if any changes symptoms. -

## 2014-07-11 ENCOUNTER — Encounter: Payer: Self-pay | Admitting: Podiatry

## 2014-07-31 ENCOUNTER — Encounter: Payer: Self-pay | Admitting: Podiatry

## 2014-08-04 ENCOUNTER — Encounter: Payer: Self-pay | Admitting: *Deleted

## 2014-08-14 ENCOUNTER — Encounter: Payer: Self-pay | Admitting: Podiatry

## 2014-08-14 ENCOUNTER — Ambulatory Visit (INDEPENDENT_AMBULATORY_CARE_PROVIDER_SITE_OTHER): Payer: Medicare Other | Admitting: Podiatry

## 2014-08-14 DIAGNOSIS — B351 Tinea unguium: Secondary | ICD-10-CM | POA: Diagnosis not present

## 2014-08-14 MED ORDER — TAVABOROLE 5 % EX SOLN: 1.0000 [drp] | CUTANEOUS | Status: DC

## 2014-08-15 NOTE — Progress Notes (Signed)
Patient ID: TYRECK BELL, male   DOB: 1941-02-21, 73 y.o.   MRN: [id number]  Subjective: 73 year old male returns the office they did pick up refurbished orthotics is also discussed nail biopsy results. He denies any acute changes since last appointment. No other complaints at this time.  Objective: AAO x3, NAD DP/PT pulses palpable 2/4 b/l. CRT < 3 sec + pedal hair Protective sensation intact with SWMF Nails dystrophic, brittle, yellow-brown-white discoloration. No surrounding erythema or drainage or other clinical signs of infection MMT 5/5, ROM WNL No calf pain, swelling, warmth.  Assessment: 73 year old male with onychomycosis.  Plan: -Conservative versus surgical treatment were discussed including alternatives, risks, complications. 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 Discussed various treatment options. At this time the patient elects to proceed with [facility] therapy knowing that it may not be the best medication for this fungus but does not want to proceed with oral therapy at this time. A prescription was sent to [facility]. He was given contact information to follow up with them. Side effects of the medication were discussed with the patient in detail and directed to stop the medications immediately if any are to occur and call the office. -Dispensed refurbished orthotics.  -Follow up as needed. Call with any questions, concerns, change in symptoms.

## 2014-09-18 DIAGNOSIS — D229 Melanocytic nevi, unspecified: Secondary | ICD-10-CM | POA: Diagnosis not present

## 2014-09-18 DIAGNOSIS — Z85828 Personal history of other malignant neoplasm of skin: Secondary | ICD-10-CM | POA: Diagnosis not present

## 2014-09-18 DIAGNOSIS — Z1283 Encounter for screening for malignant neoplasm of skin: Secondary | ICD-10-CM | POA: Diagnosis not present

## 2014-09-18 DIAGNOSIS — D485 Neoplasm of uncertain behavior of skin: Secondary | ICD-10-CM | POA: Diagnosis not present

## 2014-09-18 DIAGNOSIS — L578 Other skin changes due to chronic exposure to nonionizing radiation: Secondary | ICD-10-CM | POA: Diagnosis not present

## 2014-09-18 DIAGNOSIS — L820 Inflamed seborrheic keratosis: Secondary | ICD-10-CM | POA: Diagnosis not present

## 2014-09-18 DIAGNOSIS — D1800 Hemangioma unspecified site: Secondary | ICD-10-CM | POA: Diagnosis not present

## 2014-09-18 DIAGNOSIS — L821 Other seborrheic keratosis: Secondary | ICD-10-CM | POA: Diagnosis not present

## 2015-01-06 DIAGNOSIS — K625 Hemorrhage of anus and rectum: Secondary | ICD-10-CM | POA: Diagnosis not present

## 2015-01-06 DIAGNOSIS — Z86010 Personal history of colonic polyps: Secondary | ICD-10-CM | POA: Diagnosis not present

## 2015-01-06 DIAGNOSIS — Z800 Family history of malignant neoplasm of digestive organs: Secondary | ICD-10-CM | POA: Diagnosis not present

## 2015-01-13 HISTORY — PX: COLONOSCOPY: SHX174

## 2015-01-26 ENCOUNTER — Ambulatory Visit: Payer: Self-pay | Admitting: Unknown Physician Specialty

## 2015-01-26 DIAGNOSIS — K625 Hemorrhage of anus and rectum: Secondary | ICD-10-CM | POA: Diagnosis not present

## 2015-01-26 DIAGNOSIS — E785 Hyperlipidemia, unspecified: Secondary | ICD-10-CM | POA: Diagnosis not present

## 2015-01-26 DIAGNOSIS — Z86010 Personal history of colonic polyps: Secondary | ICD-10-CM | POA: Diagnosis not present

## 2015-01-26 DIAGNOSIS — M1990 Unspecified osteoarthritis, unspecified site: Secondary | ICD-10-CM | POA: Diagnosis not present

## 2015-01-26 DIAGNOSIS — K648 Other hemorrhoids: Secondary | ICD-10-CM | POA: Diagnosis not present

## 2015-01-26 DIAGNOSIS — Z7982 Long term (current) use of aspirin: Secondary | ICD-10-CM | POA: Diagnosis not present

## 2015-01-26 DIAGNOSIS — D123 Benign neoplasm of transverse colon: Secondary | ICD-10-CM | POA: Diagnosis not present

## 2015-01-26 DIAGNOSIS — D124 Benign neoplasm of descending colon: Secondary | ICD-10-CM | POA: Diagnosis not present

## 2015-01-26 DIAGNOSIS — K640 First degree hemorrhoids: Secondary | ICD-10-CM | POA: Diagnosis not present

## 2015-01-26 DIAGNOSIS — K5730 Diverticulosis of large intestine without perforation or abscess without bleeding: Secondary | ICD-10-CM | POA: Diagnosis not present

## 2015-01-26 LAB — HM COLONOSCOPY

## 2015-03-09 DIAGNOSIS — Z0000 Encounter for general adult medical examination without abnormal findings: Secondary | ICD-10-CM | POA: Diagnosis not present

## 2015-03-09 DIAGNOSIS — E780 Pure hypercholesterolemia: Secondary | ICD-10-CM | POA: Diagnosis not present

## 2015-03-09 DIAGNOSIS — Z125 Encounter for screening for malignant neoplasm of prostate: Secondary | ICD-10-CM | POA: Diagnosis not present

## 2015-03-09 LAB — SURGICAL PATHOLOGY

## 2015-03-16 ENCOUNTER — Other Ambulatory Visit: Payer: Self-pay | Admitting: Internal Medicine

## 2015-03-16 DIAGNOSIS — E780 Pure hypercholesterolemia: Secondary | ICD-10-CM | POA: Diagnosis not present

## 2015-03-16 DIAGNOSIS — K649 Unspecified hemorrhoids: Secondary | ICD-10-CM | POA: Diagnosis not present

## 2015-03-16 DIAGNOSIS — I714 Abdominal aortic aneurysm, without rupture, unspecified: Secondary | ICD-10-CM

## 2015-03-16 DIAGNOSIS — Z23 Encounter for immunization: Secondary | ICD-10-CM | POA: Diagnosis not present

## 2015-03-17 DIAGNOSIS — K648 Other hemorrhoids: Secondary | ICD-10-CM | POA: Diagnosis not present

## 2015-03-19 DIAGNOSIS — L820 Inflamed seborrheic keratosis: Secondary | ICD-10-CM | POA: Diagnosis not present

## 2015-03-19 DIAGNOSIS — L821 Other seborrheic keratosis: Secondary | ICD-10-CM | POA: Diagnosis not present

## 2015-03-19 DIAGNOSIS — D485 Neoplasm of uncertain behavior of skin: Secondary | ICD-10-CM | POA: Diagnosis not present

## 2015-03-19 DIAGNOSIS — L578 Other skin changes due to chronic exposure to nonionizing radiation: Secondary | ICD-10-CM | POA: Diagnosis not present

## 2015-03-19 DIAGNOSIS — B359 Dermatophytosis, unspecified: Secondary | ICD-10-CM | POA: Diagnosis not present

## 2015-03-19 DIAGNOSIS — D229 Melanocytic nevi, unspecified: Secondary | ICD-10-CM | POA: Diagnosis not present

## 2015-03-19 DIAGNOSIS — L814 Other melanin hyperpigmentation: Secondary | ICD-10-CM | POA: Diagnosis not present

## 2015-03-19 DIAGNOSIS — L603 Nail dystrophy: Secondary | ICD-10-CM | POA: Diagnosis not present

## 2015-03-19 DIAGNOSIS — Z85828 Personal history of other malignant neoplasm of skin: Secondary | ICD-10-CM | POA: Diagnosis not present

## 2015-03-19 DIAGNOSIS — D1800 Hemangioma unspecified site: Secondary | ICD-10-CM | POA: Diagnosis not present

## 2015-03-24 ENCOUNTER — Ambulatory Visit
Admission: RE | Admit: 2015-03-24 | Discharge: 2015-03-24 | Disposition: A | Discharge status: Home / Self Care | Payer: Medicare Other | Source: Ambulatory Visit | Attending: Internal Medicine | Admitting: Internal Medicine

## 2015-03-24 DIAGNOSIS — I714 Abdominal aortic aneurysm, without rupture, unspecified: Secondary | ICD-10-CM

## 2015-03-24 DIAGNOSIS — I77811 Abdominal aortic ectasia: Secondary | ICD-10-CM | POA: Diagnosis not present

## 2015-04-15 DIAGNOSIS — K648 Other hemorrhoids: Secondary | ICD-10-CM | POA: Diagnosis not present

## 2015-04-15 HISTORY — PX: HEMORRHOID BANDING: SHX5850

## 2015-05-21 DIAGNOSIS — L578 Other skin changes due to chronic exposure to nonionizing radiation: Secondary | ICD-10-CM | POA: Diagnosis not present

## 2015-05-21 DIAGNOSIS — D485 Neoplasm of uncertain behavior of skin: Secondary | ICD-10-CM | POA: Diagnosis not present

## 2015-05-21 DIAGNOSIS — D229 Melanocytic nevi, unspecified: Secondary | ICD-10-CM | POA: Diagnosis not present

## 2015-05-21 DIAGNOSIS — K648 Other hemorrhoids: Secondary | ICD-10-CM | POA: Diagnosis not present

## 2015-05-21 DIAGNOSIS — D2239 Melanocytic nevi of other parts of face: Secondary | ICD-10-CM | POA: Diagnosis not present

## 2015-05-21 DIAGNOSIS — L821 Other seborrheic keratosis: Secondary | ICD-10-CM | POA: Diagnosis not present

## 2015-05-21 DIAGNOSIS — L820 Inflamed seborrheic keratosis: Secondary | ICD-10-CM | POA: Diagnosis not present

## 2015-07-14 DIAGNOSIS — H2511 Age-related nuclear cataract, right eye: Secondary | ICD-10-CM | POA: Diagnosis not present

## 2016-03-22 ENCOUNTER — Ambulatory Visit (INDEPENDENT_AMBULATORY_CARE_PROVIDER_SITE_OTHER): Payer: Medicare Other | Admitting: Family Medicine

## 2016-03-22 ENCOUNTER — Encounter: Payer: Self-pay | Admitting: Family Medicine

## 2016-03-22 VITALS — BP 118/76 | HR 64 | Temp 98.1°F | Ht 72.0 in | Wt 229.0 lb

## 2016-03-22 DIAGNOSIS — I77811 Abdominal aortic ectasia: Secondary | ICD-10-CM | POA: Insufficient documentation

## 2016-03-22 DIAGNOSIS — IMO0002 Reserved for concepts with insufficient information to code with codable children: Secondary | ICD-10-CM | POA: Insufficient documentation

## 2016-03-22 DIAGNOSIS — M542 Cervicalgia: Secondary | ICD-10-CM | POA: Diagnosis not present

## 2016-03-22 DIAGNOSIS — Z789 Other specified health status: Secondary | ICD-10-CM

## 2016-03-22 DIAGNOSIS — M4040 Postural lordosis, site unspecified: Secondary | ICD-10-CM | POA: Insufficient documentation

## 2016-03-22 DIAGNOSIS — S0572XS Avulsion of left eye, sequela: Secondary | ICD-10-CM | POA: Diagnosis not present

## 2016-03-22 DIAGNOSIS — J302 Other seasonal allergic rhinitis: Secondary | ICD-10-CM

## 2016-03-22 DIAGNOSIS — I714 Abdominal aortic aneurysm, without rupture, unspecified: Secondary | ICD-10-CM

## 2016-03-22 DIAGNOSIS — E785 Hyperlipidemia, unspecified: Secondary | ICD-10-CM | POA: Insufficient documentation

## 2016-03-22 DIAGNOSIS — S0572XA Avulsion of left eye, initial encounter: Secondary | ICD-10-CM | POA: Insufficient documentation

## 2016-03-22 DIAGNOSIS — Z7289 Other problems related to lifestyle: Secondary | ICD-10-CM | POA: Insufficient documentation

## 2016-03-22 NOTE — Assessment & Plan Note (Signed)
Check FLP next visit

## 2016-03-22 NOTE — Patient Instructions (Addendum)
Nice to meet you today Return at your convenience over next several months for fasting labs and afterwards for wellness visit. Continue supplements as up to now.  Try neck exercises provided today.

## 2016-03-22 NOTE — Assessment & Plan Note (Signed)
Await records, will review.
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
## 2016-03-22 NOTE — Assessment & Plan Note (Signed)
Chronic neck pain with decreased ROM noted on exam. 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 Pt declines pain med for now, declines xray. Continue glucosamine/chondroitin.  Provided with stretching exercises for neck spasm. Reassess at f/u, consider cervical imaging.

## 2016-03-22 NOTE — Assessment & Plan Note (Addendum)
Discussed health risks of chronic heavy alcohol use. Encouraged slow cessation.

## 2016-03-22 NOTE — Progress Notes (Signed)
Pre visit review using our clinic review tool, if applicable. No additional management support is needed unless otherwise documented below in the visit note. 

## 2016-03-24 DIAGNOSIS — L309 Dermatitis, unspecified: Secondary | ICD-10-CM | POA: Diagnosis not present

## 2016-03-24 DIAGNOSIS — L821 Other seborrheic keratosis: Secondary | ICD-10-CM | POA: Diagnosis not present

## 2016-03-24 DIAGNOSIS — Z1283 Encounter for screening for malignant neoplasm of skin: Secondary | ICD-10-CM | POA: Diagnosis not present

## 2016-03-24 DIAGNOSIS — D229 Melanocytic nevi, unspecified: Secondary | ICD-10-CM | POA: Diagnosis not present

## 2016-03-24 DIAGNOSIS — L578 Other skin changes due to chronic exposure to nonionizing radiation: Secondary | ICD-10-CM | POA: Diagnosis not present

## 2016-03-24 DIAGNOSIS — L812 Freckles: Secondary | ICD-10-CM | POA: Diagnosis not present

## 2016-03-24 DIAGNOSIS — L219 Seborrheic dermatitis, unspecified: Secondary | ICD-10-CM | POA: Diagnosis not present

## 2016-03-24 DIAGNOSIS — D1800 Hemangioma unspecified site: Secondary | ICD-10-CM | POA: Diagnosis not present

## 2016-03-24 DIAGNOSIS — L570 Actinic keratosis: Secondary | ICD-10-CM | POA: Diagnosis not present

## 2016-03-24 DIAGNOSIS — Z85828 Personal history of other malignant neoplasm of skin: Secondary | ICD-10-CM | POA: Diagnosis not present

## 2016-03-24 DIAGNOSIS — I8393 Asymptomatic varicose veins of bilateral lower extremities: Secondary | ICD-10-CM | POA: Diagnosis not present

## 2016-03-24 DIAGNOSIS — D485 Neoplasm of uncertain behavior of skin: Secondary | ICD-10-CM | POA: Diagnosis not present

## 2016-06-28 ENCOUNTER — Other Ambulatory Visit: Payer: Self-pay | Admitting: Family Medicine

## 2016-06-28 DIAGNOSIS — Z789 Other specified health status: Secondary | ICD-10-CM

## 2016-06-28 DIAGNOSIS — Z7289 Other problems related to lifestyle: Secondary | ICD-10-CM

## 2016-06-28 DIAGNOSIS — E785 Hyperlipidemia, unspecified: Secondary | ICD-10-CM

## 2016-06-29 ENCOUNTER — Other Ambulatory Visit (INDEPENDENT_AMBULATORY_CARE_PROVIDER_SITE_OTHER): Payer: Medicare Other

## 2016-06-29 ENCOUNTER — Ambulatory Visit (INDEPENDENT_AMBULATORY_CARE_PROVIDER_SITE_OTHER): Payer: Medicare Other

## 2016-06-29 VITALS — BP 120/88 | HR 58 | Temp 98.4°F | Ht 72.0 in | Wt 236.5 lb

## 2016-06-29 DIAGNOSIS — E785 Hyperlipidemia, unspecified: Secondary | ICD-10-CM

## 2016-06-29 DIAGNOSIS — Z789 Other specified health status: Secondary | ICD-10-CM

## 2016-06-29 DIAGNOSIS — Z0000 Encounter for general adult medical examination without abnormal findings: Secondary | ICD-10-CM

## 2016-06-29 DIAGNOSIS — Z7289 Other problems related to lifestyle: Secondary | ICD-10-CM

## 2016-06-29 DIAGNOSIS — Z23 Encounter for immunization: Secondary | ICD-10-CM | POA: Diagnosis not present

## 2016-06-29 LAB — CBC WITH DIFFERENTIAL/PLATELET
Basophils Absolute: 0 10*3/uL (ref 0.0–0.1)
Basophils Relative: 0.6 % (ref 0.0–3.0)
Eosinophils Absolute: 0.4 10*3/uL (ref 0.0–0.7)
Eosinophils Relative: 6.4 % — ABNORMAL HIGH (ref 0.0–5.0)
HCT: 46.9 % (ref 39.0–52.0)
Hemoglobin: 16 g/dL (ref 13.0–17.0)
Lymphocytes Relative: 33 % (ref 12.0–46.0)
Lymphs Abs: 2 10*3/uL (ref 0.7–4.0)
MCHC: 34.2 g/dL (ref 30.0–36.0)
MCV: 99.1 fl (ref 78.0–100.0)
Monocytes Absolute: 0.7 10*3/uL (ref 0.1–1.0)
Monocytes Relative: 11.3 % (ref 3.0–12.0)
Neutro Abs: 3 10*3/uL (ref 1.4–7.7)
Neutrophils Relative %: 48.7 % (ref 43.0–77.0)
Platelets: 288 10*3/uL (ref 150.0–400.0)
RBC: 4.74 Mil/uL (ref 4.22–5.81)
RDW: 12.8 % (ref 11.5–15.5)
WBC: 6.1 10*3/uL (ref 4.0–10.5)

## 2016-06-29 LAB — COMPREHENSIVE METABOLIC PANEL
ALT: 23 U/L (ref 0–53)
AST: 22 U/L (ref 0–37)
Albumin: 4.4 g/dL (ref 3.5–5.2)
Alkaline Phosphatase: 47 U/L (ref 39–117)
BUN: 17 mg/dL (ref 6–23)
CO2: 30 mEq/L (ref 19–32)
Calcium: 9.6 mg/dL (ref 8.4–10.5)
Chloride: 103 mEq/L (ref 96–112)
Creatinine, Ser: 1.05 mg/dL (ref 0.40–1.50)
GFR: 73.56 mL/min (ref >60.00)
Glucose, Bld: 96 mg/dL (ref 70–99)
Potassium: 4.5 mEq/L (ref 3.5–5.1)
Sodium: 138 mEq/L (ref 135–145)
Total Bilirubin: 0.8 mg/dL (ref 0.2–1.2)
Total Protein: 7.5 g/dL (ref 6.0–8.3)

## 2016-06-29 LAB — LIPID PANEL
Cholesterol: 199 mg/dL (ref 0–200)
HDL: 74.3 mg/dL (ref >39.00)
LDL Cholesterol: 111 mg/dL — ABNORMAL HIGH (ref 0–99)
NonHDL: 125.13
Total CHOL/HDL Ratio: 3
Triglycerides: 72 mg/dL (ref 0.0–149.0)
VLDL: 14.4 mg/dL (ref 0.0–40.0)

## 2016-06-29 NOTE — Progress Notes (Signed)
Subjective:   Patrick Sawyer is a 75 y.o. male who presents for Medicare Annual/Subsequent preventive examination.  Review of Systems:  N/A Cardiac Risk Factors include: advanced age (>72men, >33 women);dyslipidemia;obesity (BMI >30kg/m2);male gender     Objective:    Vitals: BP 120/88 (BP Location: Left Arm, Patient Position: Sitting, Cuff Size: Normal)   Pulse (!) 58   Temp 98.4 F (36.9 C) (Oral)   Ht 6' (1.829 m) Comment: no shoes  Wt 236 lb 8 oz (107.3 kg)   SpO2 98%   BMI 32.08 kg/m   Body mass index is 32.08 kg/m.  Tobacco History  Smoking Status  . Former Smoker  . Quit date: 11/14/1989  Smokeless Tobacco  . Never Used     Counseling given: No   Past Medical History:  Diagnosis Date  . AAA (abdominal aortic aneurysm) ([place])    stable over last 3 yrs  . 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 Cervical arthritis ([place])   . 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 HLD (hyperlipidemia)   . Obesity, Class I, BMI 30-34.9   . Seasonal allergies   . Traumatic enucleation of left eye    Past Surgical History:  Procedure Laterality Date  . APPENDECTOMY  1959  . ENUCLEATION Left 1969  . HEMORRHOID BANDING    . MENISCUS REPAIR Left 2005  . VARICOSE VEIN SURGERY  2006   Family History  Problem Relation Age of Onset  . Alcohol abuse Father   . Alcohol abuse Brother   . Arthritis Mother     knee  . Cancer Brother     lung (smoker)  . Cancer Brother     colon  . Hypertension Brother   . CAD Brother     CABG (smoker, EtOH)  . Diabetes Neg Hx    History  Sexual Activity  . Sexual activity: No    Outpatient Encounter Prescriptions as of 06/29/2016  Medication Sig  . aspirin 81 MG EC tablet Take 81 mg by mouth daily. Swallow whole.  Marland Kitchen glucosamine-chondroitin 500-400 MG tablet Take 2 tablets by mouth daily.  . Multiple Vitamins-Minerals (MULTIVITAMIN ADULT PO) Take 1  capsule by mouth daily.  . Omega-3 Fatty Acids (FISH OIL) 1000 MG CAPS Take 1 capsule by mouth daily.   No facility-administered encounter medications on file as of 06/29/2016.     Activities of Daily Living In your present state of health, do you have any difficulty performing the following activities: 06/29/2016  Hearing? N  Vision? N  Difficulty concentrating or making decisions? N  Walking or climbing stairs? N  Dressing or bathing? N  Doing errands, shopping? N  Preparing Food and eating ? N  Using the Toilet? N  In the past six months, have you accidently leaked urine? N  Do you have problems with loss of bowel control? N  Managing your Medications? N  Managing your Finances? N  Housekeeping or managing your Housekeeping? N  Some recent data might be hidden    Patient Care Team: Ria Bush, MD as PCP - General (Family Medicine) Ralene Bathe, MD as Consulting Physician (Dermatology) Ronnell Freshwater, MD as Referring Physician (Ophthalmology)   Assessment:    Hearing Screening Comments: Total hearing loss in left ear; hearing aid in right ear Vision Screening Comments: Last vision exam with Dr. Cephus Shelling on 07/14/15  Exercise Activities and  Dietary recommendations Current Exercise Habits: Home exercise routine, Type of exercise: strength training/weights;Other - see comments (aerobics), Time (Minutes): 60, Frequency (Times/Week): 3, Weekly Exercise (Minutes/Week): 180, Exercise limited by: None identified  Goals    . Increase physical activity          Starting 06/29/2016, I will continue to exercise for at least 60 min 3 days per week.       Fall Risk Fall Risk  06/29/2016  Falls in the past year? No   Depression Screen PHQ 2/9 Scores 06/29/2016  PHQ - 2 Score 0    Cognitive Testing MMSE - Mini Mental State Exam 06/29/2016  Orientation to time 5  Orientation to Place 5  Registration 3  Attention/ Calculation 0  Recall 3  Language- name 2 objects 0    Language- repeat 1  Language- follow 3 step command 3  Language- read & follow direction 0  Write a sentence 0  Copy design 0  Total score 20   PLEASE NOTE: A Mini-Cog screen was completed. Maximum score is 20. A value of 0 denotes this part of [place] MMSE was not completed or the patient failed this part of the Mini-Cog screening.   Mini-Cog Screening Orientation to Time - Max 5 pts Orientation to Place - Max 5 pts Registration - Max 3 pts Recall - Max 3 pts Language Repeat - Max 1 pts Language Follow 3 Step Command - Max 3 pts   Immunization History  Administered Date(s) Administered  . Pneumococcal Polysaccharide-23 06/29/2016   Screening Tests Health Maintenance  Topic Date Due  . INFLUENZA VACCINE  11/13/2016 (Originally 06/14/2016)  . DTaP/Tdap/Td (2 - Td) 11/14/2021  . TETANUS/TDAP  11/14/2021  . COLONOSCOPY  04/14/2025  . ZOSTAVAX  Addressed  . PNA vac Low Risk Adult  Addressed      Plan:     I have personally reviewed and addressed the Medicare Annual Wellness questionnaire and have noted the following in the patient's chart:  A. Medical and social history B. Use of alcohol, tobacco or illicit drugs  C. Current medications and supplements D. Functional ability and status E.  Nutritional status F.  Physical activity G. Advance directives H. List of other physicians I.  Hospitalizations, surgeries, and ER visits in previous 12 months J.  [place] to include hearing, vision, cognitive, depression L. Referrals and appointments - none  In addition, I have reviewed and discussed with patient certain preventive protocols, quality metrics, and best practice recommendations. A written personalized care plan for preventive services as well as general preventive health recommendations were provided to patient.  See attached scanned questionnaire for additional information.   Signed,   Lindell Noe, MHA, BS, LPN Health Advisor

## 2016-06-29 NOTE — Patient Instructions (Signed)
Mr. Patrick Sawyer , Thank you for taking time to come for your Medicare Wellness Visit. I appreciate your ongoing commitment to your health goals. Please review the following plan we discussed and let me know if I can assist you in the future.   These are the goals we discussed: Goals    . Increase physical activity          Starting 06/29/2016, I will continue to exercise for at least 60 min 3 days per week.        This is a list of the screening recommended for you and due dates:  Health Maintenance  Topic Date Due  . Flu Shot  11/13/2016*  . DTaP/Tdap/Td vaccine (2 - Td) 11/14/2021  . Tetanus Vaccine  11/14/2021  . Colon Cancer Screening  04/14/2025  . Shingles Vaccine  Addressed  . Pneumonia vaccines  Addressed  *Topic was postponed. The date shown is not the original due date.   Preventive Care for Adults  A healthy lifestyle and preventive care can promote health and wellness. Preventive health guidelines for adults include the following key practices.  . A routine yearly physical is a good way to check with your health care provider about your health and preventive screening. It is a chance to share any concerns and updates on your health and to receive a thorough exam.  . Visit your dentist for a routine exam and preventive care every 6 months. Brush your teeth twice a day and floss once a day. Good oral hygiene prevents tooth decay and gum disease.  . The frequency of eye exams is based on your age, health, family medical history, use  of contact lenses, and other factors. Follow your health care provider's ecommendations for frequency of eye exams.  . Eat a healthy diet. Foods like vegetables, fruits, whole grains, low-fat dairy products, and lean protein foods contain the nutrients you need without too many calories. Decrease your intake of foods high in solid fats, added sugars, and salt. Eat the right amount of calories for you. Get information about a proper diet from  your health care provider, if necessary.  . Regular physical exercise is one of the most important things you can do for your health. Most adults should get at least 150 minutes of moderate-intensity exercise (any activity that increases your heart rate and causes you to sweat) each week. In addition, most adults need muscle-strengthening exercises on 2 or more days a week.  Silver Sneakers may be a benefit available to you. To determine eligibility, you may visit the website: www.silversneakers.com or contact program at (931)170-1346 Mon-Fri between 8AM-8PM.   . Maintain a healthy weight. The body mass index (BMI) is a screening tool to identify possible weight problems. It provides an estimate of body fat based on height and weight. Your health care provider can find your BMI and can help you achieve or maintain a healthy weight.   For adults 20 years and older: ? A BMI below 18.5 is considered underweight. ? A BMI of 18.5 to 24.9 is normal. ? A BMI of 25 to 29.9 is considered overweight. ? A BMI of 30 and above is considered obese.   . Maintain normal blood lipids and cholesterol levels by exercising and minimizing your intake of saturated fat. Eat a balanced diet with plenty of fruit and vegetables. Blood tests for lipids and cholesterol should begin at age 33 and be repeated every 5 years. If your lipid or cholesterol levels are high,  you are over 50, or you are at high risk for heart disease, you may need your cholesterol levels checked more frequently. Ongoing high lipid and cholesterol levels should be treated with medicines if diet and exercise are not working.  . If you smoke, find out from your health care provider how to quit. If you do not use tobacco, please do not start.  . If you choose to drink alcohol, please do not consume more than 2 drinks per day. One drink is considered to be 12 ounces (355 mL) of beer, 5 ounces (148 mL) of wine, or 1.5 ounces (44 mL) of liquor.  . If you  are 39-34 years old, ask your health care provider if you should take aspirin to prevent strokes.  . Use sunscreen. Apply sunscreen liberally and repeatedly throughout the day. You should seek shade when your shadow is shorter than you. Protect yourself by wearing long sleeves, pants, a wide-brimmed hat, and sunglasses year round, whenever you are outdoors.  . Once a month, do a whole body skin exam, using a mirror to look at the skin on your back. Tell your health care provider of new moles, moles that have irregular borders, moles that are larger than a pencil eraser, or moles that have changed in shape or color.

## 2016-06-29 NOTE — Progress Notes (Signed)
I reviewed health advisor's note, was available for consultation, and agree with documentation and plan.  

## 2016-06-29 NOTE — Progress Notes (Signed)
PCP notes:   Health maintenance:  Flu vaccine - addressed PPSV23 - administered  Abnormal screenings:   None  Patient concerns:   None  Nurse concerns:  None  Next PCP appt:   07/07/16 @ 0915

## 2016-06-29 NOTE — Progress Notes (Signed)
Pre visit review using our clinic review tool, if applicable. No additional management support is needed unless otherwise documented below in the visit note. 

## 2016-07-05 ENCOUNTER — Encounter: Payer: Federal, State, Local not specified - PPO | Admitting: Family Medicine

## 2016-07-07 ENCOUNTER — Encounter: Payer: Self-pay | Admitting: Family Medicine

## 2016-07-07 ENCOUNTER — Ambulatory Visit (INDEPENDENT_AMBULATORY_CARE_PROVIDER_SITE_OTHER): Payer: Medicare Other | Admitting: Family Medicine

## 2016-07-07 VITALS — BP 130/84 | HR 60 | Temp 98.1°F | Wt 237.5 lb

## 2016-07-07 DIAGNOSIS — Z789 Other specified health status: Secondary | ICD-10-CM

## 2016-07-07 DIAGNOSIS — I714 Abdominal aortic aneurysm, without rupture, unspecified: Secondary | ICD-10-CM

## 2016-07-07 DIAGNOSIS — Z7189 Other specified counseling: Secondary | ICD-10-CM | POA: Insufficient documentation

## 2016-07-07 DIAGNOSIS — E785 Hyperlipidemia, unspecified: Secondary | ICD-10-CM

## 2016-07-07 DIAGNOSIS — Z7289 Other problems related to lifestyle: Secondary | ICD-10-CM

## 2016-07-07 NOTE — Assessment & Plan Note (Signed)
Advanced directive discussion - has at home. Wife is HCPOA. Asked to bring copy. 

## 2016-07-07 NOTE — Assessment & Plan Note (Signed)
Continue to encourage decreased alcohol use. CBC, CMP WNL.

## 2016-07-07 NOTE — Progress Notes (Addendum)
BP 130/84   Pulse 60   Temp 98.1 F (36.7 C) (Oral)   Wt 237 lb 8 oz (107.7 kg)   BMI 32.21 kg/m    CC: f/u visit after AMW Subjective:    Patient ID: Patrick Sawyer, male    DOB: [birth year], 75 y.o.   MRN: [id number]  HPI: Patrick Sawyer is a 75 y.o. male presenting on 07/07/2016 for Annual Exam   Saw Katha Cabal last week for medicare wellness visit. Note reviewed.  I have not received records from Dr Minna Antis.  04/2014 - Moh's surgery on ear  Preventative: COLONOSCOPY  01/2015 rpt 3 yrs Tiffany Kocher) Prostate cancer screening - regularly screened until age 20. Would like to continue screening at this time.  Lung cancer screening - quit smoking 1991.  Flu shot - yearly Tetanus shot - will defer at this time Pneumovax 06/2016, thinks prevnar 4 yrs ago Shingles shot ~ 2012 Advanced directive discussion - has at home. Wife is HCPOA. Asked to bring copy. Seat belt use discussed Sunscreen use and skin screen discussed. Sees derm yearly (Dr Nehemiah Massed).  Quit smoking 1991 Alcohol - continued 6-8 beers/day regularly   Lives with wife Occupation: retired, was Optometrist Edu: BS accounting Activity: gym 3d/wk, stays busy with housework, traveling Diet: good water, fruits/vegetables daily  Relevant past medical, surgical, family and social history reviewed and updated as indicated. Interim medical history since our last visit reviewed. Allergies and medications reviewed and updated. Current Outpatient Prescriptions on File Prior to Visit  Medication Sig  . aspirin 81 MG EC tablet Take 81 mg by mouth daily. Swallow whole.  Marland Kitchen glucosamine-chondroitin 500-400 MG tablet Take 2 tablets by mouth daily.  . Multiple Vitamins-Minerals (MULTIVITAMIN ADULT PO) Take 1 capsule by mouth daily.  . Omega-3 Fatty Acids (FISH OIL) 1000 MG CAPS Take 1 capsule by mouth daily.   No current facility-administered medications on file prior to visit.     Review of Systems Per HPI unless  specifically indicated in ROS section     Objective:    BP 130/84   Pulse 60   Temp 98.1 F (36.7 C) (Oral)   Wt 237 lb 8 oz (107.7 kg)   BMI 32.21 kg/m   Wt Readings from Last 3 Encounters:  07/07/16 237 lb 8 oz (107.7 kg)  06/29/16 236 lb 8 oz (107.3 kg)  03/22/16 229 lb (103.9 kg)    Physical Exam  Constitutional: He is oriented to person, place, and time. He appears well-developed and well-nourished. No distress.  HENT:  Head: Normocephalic and atraumatic.  Right Ear: Tympanic membrane, external ear and ear canal normal. Decreased hearing is noted.  Left Ear: Tympanic membrane, external ear and ear canal normal. Decreased hearing is noted.  Nose: Nose normal.  Mouth/Throat: Uvula is midline, oropharynx is clear and moist and mucous membranes are normal. No oropharyngeal exudate, posterior oropharyngeal edema or posterior oropharyngeal erythema.  Wears R hearing aide  Eyes: Conjunctivae and EOM are normal. No scleral icterus.  L artifical eye  Neck: Normal range of motion. Neck supple. Carotid bruit is not present. No thyromegaly present.  Cardiovascular: Normal rate, regular rhythm, normal heart sounds and intact distal pulses.   No murmur heard. Pulses:      Radial pulses are 2+ on the right side, and 2+ on the left side.  Pulmonary/Chest: Effort normal and breath sounds normal. No respiratory distress. He has no wheezes. He has no rales.  Abdominal: Soft. Bowel sounds are normal. He  exhibits no distension and no mass. There is no tenderness. There is no rebound and no guarding.  Genitourinary: Rectum normal and prostate normal. Rectal exam shows no external hemorrhoid, no internal hemorrhoid, no fissure, no mass, no tenderness and anal tone normal. Prostate is not enlarged (15gm) and not tender.  Musculoskeletal: Normal range of motion. He exhibits no edema.  Lymphadenopathy:    He has no cervical adenopathy.  Neurological: He is alert and oriented to person, place, and  time.  CN grossly intact, station and gait intact  Skin: Skin is warm and dry. No rash noted.  Psychiatric: He has a normal mood and affect. His behavior is normal. Judgment and thought content normal.  Nursing note and vitals reviewed.  Results for orders placed or performed in visit on 07/07/16  HM COLONOSCOPY  Result Value Ref Range   HM Colonoscopy Patient Reported See Report (in chart), Patient Reported  No results found for: PSA     Assessment & Plan:  Over 25 minutes were spent face-to-face with the patient during this encounter and >50% of that time was spent on counseling and coordination of care. Preventative protocols reviewed and updated unless pt declined. Discussed healthy diet and lifestyle. Prior records ordered again as I have not yet received these.  Problem List Items Addressed This Visit    AAA (abdominal aortic aneurysm) ([place])    Have not received records to review. Requested again.      Advanced care planning/counseling discussion    Advanced directive discussion - has at home. Wife is HCPOA. Asked to bring copy.      Alcohol use ([place])    Continue to encourage decreased alcohol use. CBC, CMP WNL.       HLD (hyperlipidemia) - Primary    Mild off Rx meds. Continue fish oil.        Other Visit Diagnoses   None.      Follow up plan: Return in about 1 year (around 07/07/2017), or as needed, for medicare wellness visit.  Ria Bush, MD

## 2016-07-07 NOTE — Patient Instructions (Addendum)
Sign release for records from Dr Wilmon Pali office and latest colonoscopy report.   Bring [place] a copy of your advanced directive.  Look into low cholesterol diet and mediterranean diet.  Return as needed or in 1 year for next wellness visit  Health Maintenance, Male A healthy lifestyle and preventative care can promote health and wellness.  Maintain regular health, dental, and eye exams.  Eat a healthy diet. Foods like vegetables, fruits, whole grains, low-fat dairy products, and lean protein foods contain the nutrients you need and are low in calories. Decrease your intake of foods high in solid fats, added sugars, and salt. Get information about a proper diet from your health care provider, if necessary.  Regular physical exercise is one of the most important things you can do for your health. Most adults should get at least 150 minutes of moderate-intensity exercise (any activity that increases your heart rate and causes you to sweat) each week. In addition, most adults need muscle-strengthening exercises on 2 or more days a week.   Maintain a healthy weight. The body mass index (BMI) is a screening tool to identify possible weight problems. It provides an estimate of body fat based on height and weight. Your health care provider can find your BMI and can help you achieve or maintain a healthy weight. For males 20 years and older:  A BMI below 18.5 is considered underweight.  A BMI of 18.5 to 24.9 is normal.  A BMI of 25 to 29.9 is considered overweight.  A BMI of 30 and above is considered obese.  Maintain normal blood lipids and cholesterol by exercising and minimizing your intake of saturated fat. Eat a balanced diet with plenty of fruits and vegetables. Blood tests for lipids and cholesterol should begin at age 6 and be repeated every 5 years. If your lipid or cholesterol levels are high, you are over age 40, or you are at high risk for heart disease, you may need your cholesterol levels  checked more frequently.Ongoing high lipid and cholesterol levels should be treated with medicines if diet and exercise are not working.  If you smoke, find out from your health care provider how to quit. If you do not use tobacco, do not start.  Lung cancer screening is recommended for adults aged 16-80 years who are at high risk for developing lung cancer because of a history of smoking. A yearly low-dose CT scan of the lungs is recommended for people who have at least a 30-pack-year history of smoking and are current smokers or have quit within the past 15 years. A pack year of smoking is smoking an average of 1 pack of cigarettes a day for 1 year (for example, a 30-pack-year history of smoking could mean smoking 1 pack a day for 30 years or 2 packs a day for 15 years). Yearly screening should continue until the smoker has stopped smoking for at least 15 years. Yearly screening should be stopped for people who develop a health problem that would prevent them from having lung cancer treatment.  If you choose to drink alcohol, do not have more than 2 drinks per day. One drink is considered to be 12 oz (360 mL) of beer, 5 oz (150 mL) of wine, or 1.5 oz (45 mL) of liquor.  Avoid the use of street drugs. Do not share needles with anyone. Ask for help if you need support or instructions about stopping the use of drugs.  High blood pressure causes heart disease and  increases the risk of stroke. High blood pressure is more likely to develop in:  People who have blood pressure in the end of the normal range (100-139/85-89 mm Hg).  People who are overweight or obese.  People who are African American.  If you are 56-55 years of age, have your blood pressure checked every 3-5 years. If you are 27 years of age or older, have your blood pressure checked every year. You should have your blood pressure measured twice--once when you are at a hospital or clinic, and once when you are not at a hospital or clinic.  Record the average of the two measurements. To check your blood pressure when you are not at a hospital or clinic, you can use:  An automated blood pressure machine at a pharmacy.  A home blood pressure monitor.  If you are 51-21 years old, ask your health care provider if you should take aspirin to prevent heart disease.  Diabetes screening involves taking a blood sample to check your fasting blood sugar level. This should be done once every 3 years after age 87 if you are at a normal weight and without risk factors for diabetes. Testing should be considered at a younger age or be carried out more frequently if you are overweight and have at least 1 risk factor for diabetes.  Colorectal cancer can be detected and often prevented. Most routine colorectal cancer screening begins at the age of 39 and continues through age 86. However, your health care provider may recommend screening at an earlier age if you have risk factors for colon cancer. On a yearly basis, your health care provider may provide home test kits to check for hidden blood in the stool. A small camera at the end of a tube may be used to directly examine the colon (sigmoidoscopy or colonoscopy) to detect the earliest forms of colorectal cancer. Talk to your health care provider about this at age 45 when routine screening begins. A direct exam of the colon should be repeated every 5-10 years through age 43, unless early forms of precancerous polyps or small growths are found.  People who are at an increased risk for hepatitis B should be screened for this virus. You are considered at high risk for hepatitis B if:  You were born in a country where hepatitis B occurs often. Talk with your health care provider about which countries are considered high risk.  Your parents were born in a high-risk country and you have not received a shot to protect against hepatitis B (hepatitis B vaccine).  You have HIV or AIDS.  You use needles to  inject street drugs.  You live with, or have sex with, someone who has hepatitis B.  You are a man who has sex with other men (MSM).  You get hemodialysis treatment.  You take certain medicines for conditions like cancer, organ transplantation, and autoimmune conditions.  Hepatitis C blood testing is recommended for all people born from [birth year] through 1963 and any individual with known risk factors for hepatitis C.  Healthy men should no longer receive prostate-specific antigen (PSA) blood tests as part of routine cancer screening. Talk to your health care provider about prostate cancer screening.  Testicular cancer screening is not recommended for adolescents or adult males who have no symptoms. Screening includes self-exam, a health care provider exam, and other screening tests. Consult with your health care provider about any symptoms you have or any concerns you have about testicular cancer.  Practice safe sex. Use condoms and avoid high-risk sexual practices to reduce the spread of sexually transmitted infections (STIs).  You should be screened for STIs, including gonorrhea and chlamydia if:  You are sexually active and are younger than 24 years.  You are older than 24 years, and your health care provider tells you that you are at risk for this type of infection.  Your sexual activity has changed since you were last screened, and you are at an increased risk for chlamydia or gonorrhea. Ask your health care provider if you are at risk.  If you are at risk of being infected with HIV, it is recommended that you take a prescription medicine daily to prevent HIV infection. This is called pre-exposure prophylaxis (PrEP). You are considered at risk if:  You are a man who has sex with other men (MSM).  You are a heterosexual man who is sexually active with multiple partners.  You take drugs by injection.  You are sexually active with a partner who has HIV.  Talk with your health care  provider about whether you are at high risk of being infected with HIV. If you choose to begin PrEP, you should first be tested for HIV. You should then be tested every 3 months for as long as you are taking PrEP.  Use sunscreen. Apply sunscreen liberally and repeatedly throughout the day. You should seek shade when your shadow is shorter than you. Protect yourself by wearing long sleeves, pants, a wide-brimmed hat, and sunglasses year round whenever you are outdoors.  Tell your health care provider of new moles or changes in moles, especially if there is a change in shape or color. Also, tell your health care provider if a mole is larger than the size of a pencil eraser.  A one-time screening for abdominal aortic aneurysm (AAA) and surgical repair of large AAAs by ultrasound is recommended for men aged 82-75 years who are current or former smokers.  Stay current with your vaccines (immunizations).   This information is not intended to replace advice given to you by your health care provider. Make sure you discuss any questions you have with your health care provider.   Document Released: 04/28/2008 Document Revised: 11/21/2014 Document Reviewed: 03/28/2011 Elsevier Interactive Patient Education [employer].

## 2016-07-07 NOTE — Progress Notes (Signed)
Pre visit review using our clinic review tool, if applicable. No additional management support is needed unless otherwise documented below in the visit note. 

## 2016-07-07 NOTE — Assessment & Plan Note (Signed)
Have not received records to review. Requested again.

## 2016-07-07 NOTE — Assessment & Plan Note (Addendum)
Mild off Rx meds. Continue fish oil.

## 2016-08-19 IMAGING — US US AORTA
1 series · 14 of 14 positions shown · non-contrast
Comparison: None.

CLINICAL DATA: Family history of aortic aneurysm.

EXAM:
ULTRASOUND OF ABDOMINAL AORTA
TECHNIQUE: Ultrasound examination of the abdominal aorta was performed to
evaluate for abdominal aortic aneurysm.

[Series 1: us aorta · 0.25mm/px · 14 acquisitions, 14 frames shown]
[im 1/14]
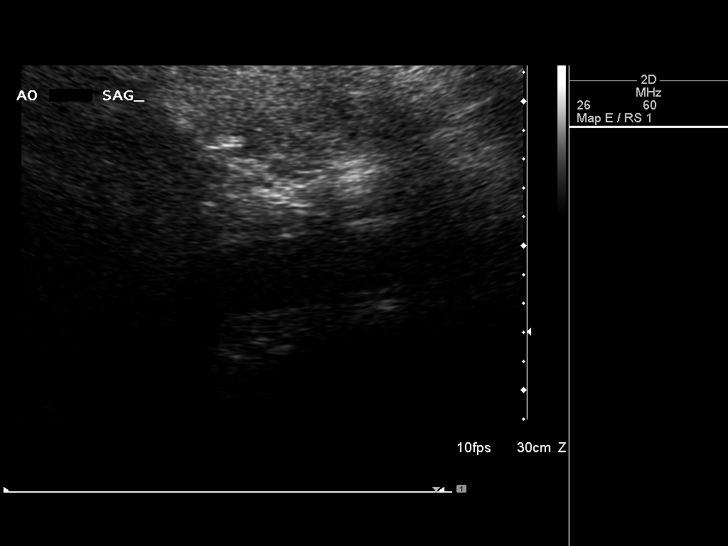
[im 2/14]
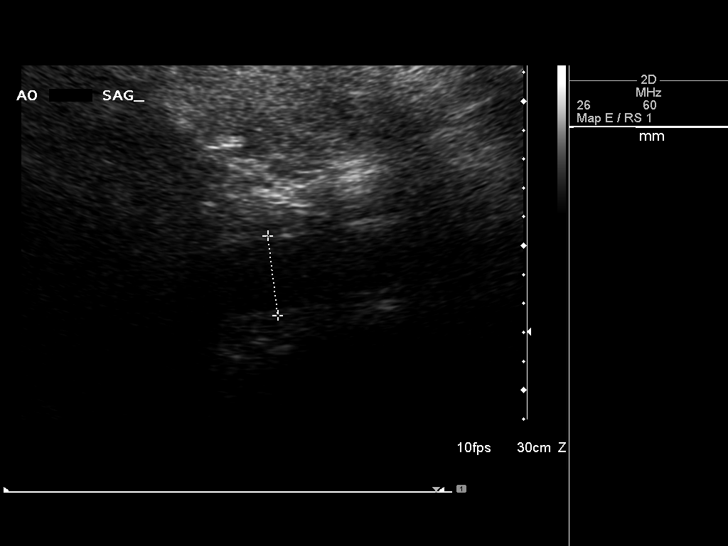
[im 3/14]
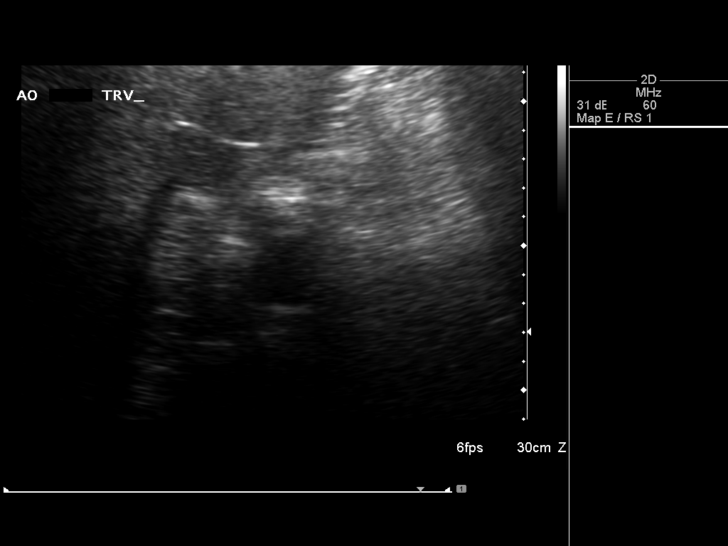
[im 4/14]
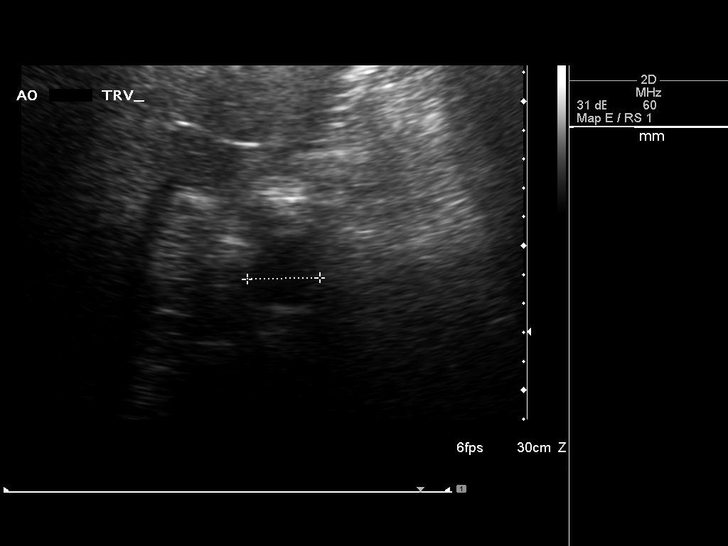
[im 5/14]
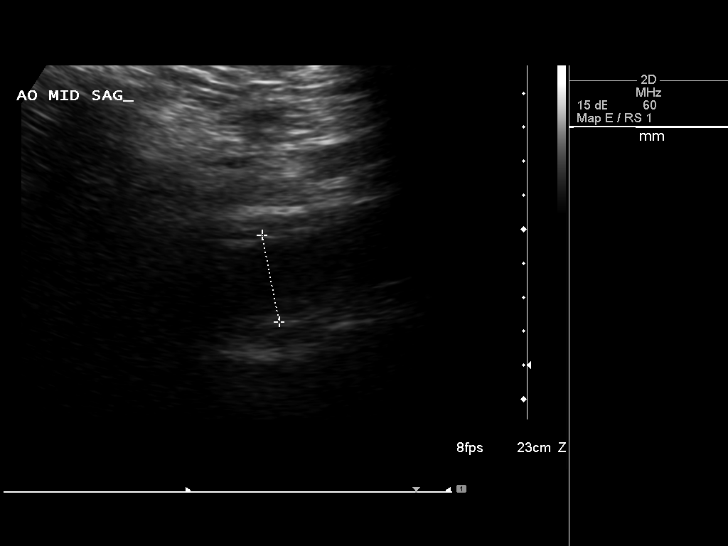
[im 6/14]
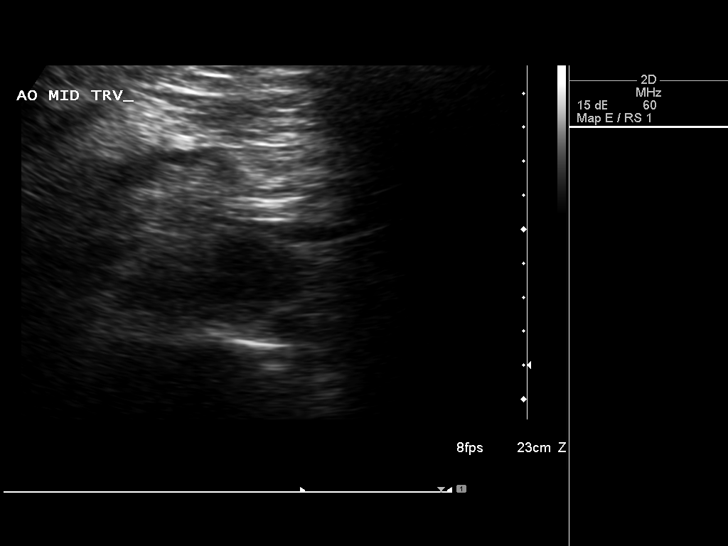
[im 7/14]
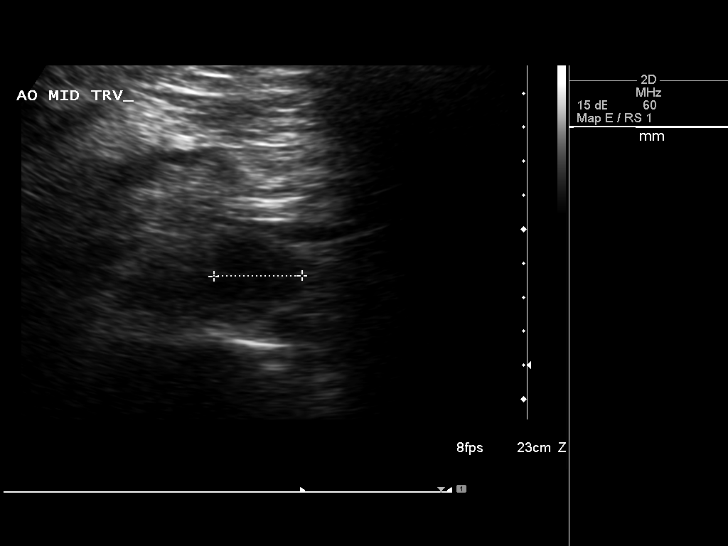
[im 8/14]
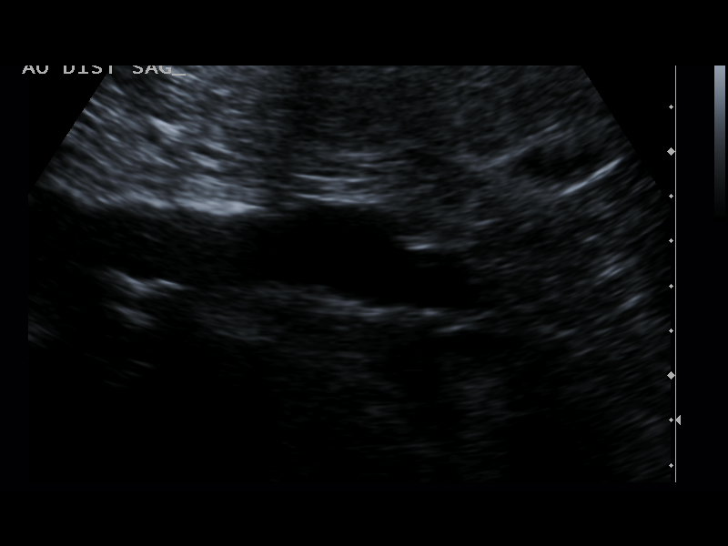
[im 9/14]
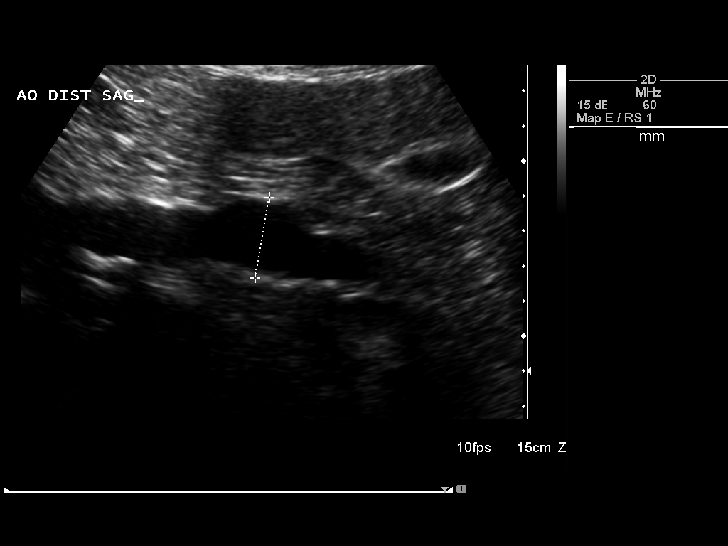
[im 10/14]
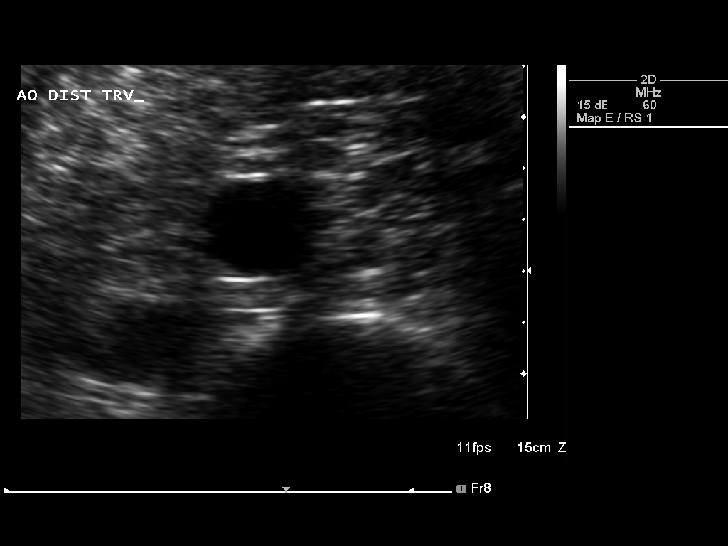
[im 11/14]
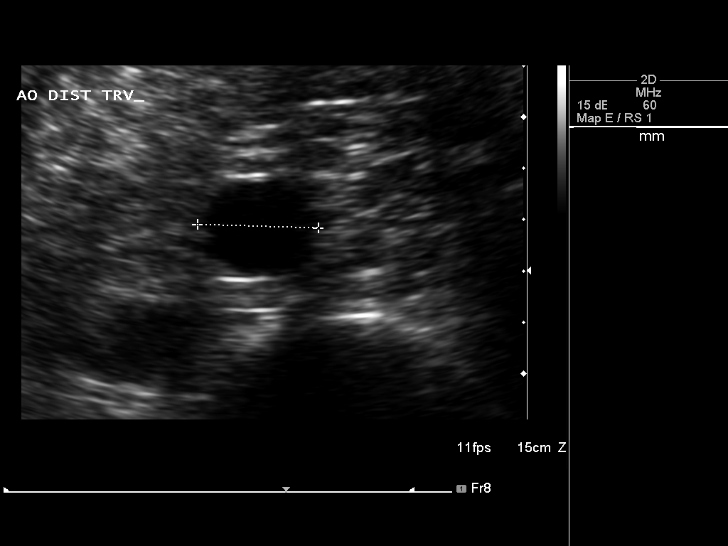
[im 12/14]
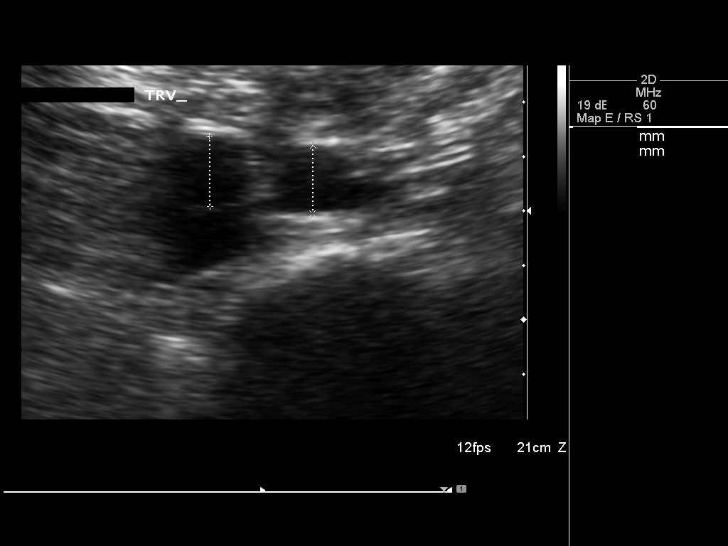
[im 13/14]
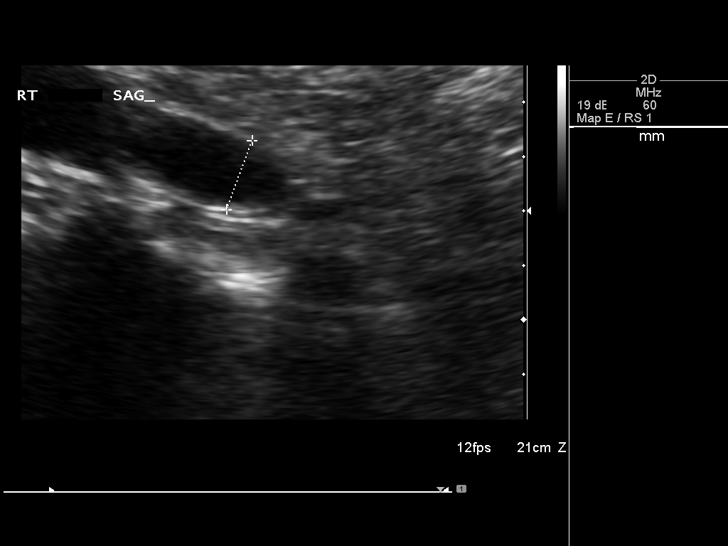
[im 14/14]
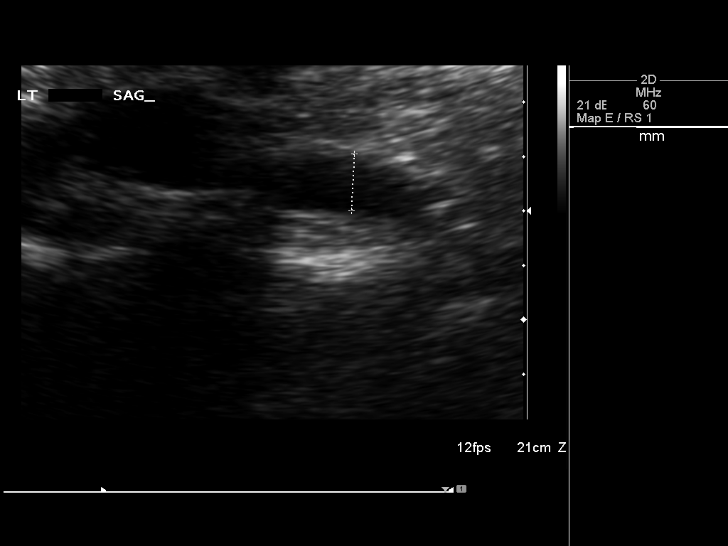

[14 of 14 positions shown; findings below may reference images not displayed]

FINDINGS: Abdominal Aorta

Abdominal aortic ectasia to 2.8 cm.

Maximum AP

Diameter:  2.8 cm

Maximum TRV

Diameter: 2.5 cm
IMPRESSION: Abdominal aortic ectasia 2.8 cm. Ectatic abdominal aorta at risk for
aneurysm development. Recommend follow up by US in 5 years. This
recommendation follows ACR consensus guidelines: White Paper of the
ACR Incidental Findings Committee II on Vascular Findings. [HOSPITAL] 2019; [DATE].

## 2017-04-03 DIAGNOSIS — L812 Freckles: Secondary | ICD-10-CM | POA: Diagnosis not present

## 2017-04-03 DIAGNOSIS — D485 Neoplasm of uncertain behavior of skin: Secondary | ICD-10-CM | POA: Diagnosis not present

## 2017-04-03 DIAGNOSIS — D1800 Hemangioma unspecified site: Secondary | ICD-10-CM | POA: Diagnosis not present

## 2017-04-03 DIAGNOSIS — B353 Tinea pedis: Secondary | ICD-10-CM | POA: Diagnosis not present

## 2017-04-03 DIAGNOSIS — I8393 Asymptomatic varicose veins of bilateral lower extremities: Secondary | ICD-10-CM | POA: Diagnosis not present

## 2017-04-03 DIAGNOSIS — Z1283 Encounter for screening for malignant neoplasm of skin: Secondary | ICD-10-CM | POA: Diagnosis not present

## 2017-04-03 DIAGNOSIS — L219 Seborrheic dermatitis, unspecified: Secondary | ICD-10-CM | POA: Diagnosis not present

## 2017-04-03 DIAGNOSIS — L820 Inflamed seborrheic keratosis: Secondary | ICD-10-CM | POA: Diagnosis not present

## 2017-04-03 DIAGNOSIS — L578 Other skin changes due to chronic exposure to nonionizing radiation: Secondary | ICD-10-CM | POA: Diagnosis not present

## 2017-04-03 DIAGNOSIS — D229 Melanocytic nevi, unspecified: Secondary | ICD-10-CM | POA: Diagnosis not present

## 2017-04-03 DIAGNOSIS — Z85828 Personal history of other malignant neoplasm of skin: Secondary | ICD-10-CM | POA: Diagnosis not present

## 2017-07-01 ENCOUNTER — Other Ambulatory Visit: Payer: Self-pay | Admitting: Family Medicine

## 2017-07-01 DIAGNOSIS — Z7289 Other problems related to lifestyle: Secondary | ICD-10-CM

## 2017-07-01 DIAGNOSIS — Z125 Encounter for screening for malignant neoplasm of prostate: Secondary | ICD-10-CM

## 2017-07-01 DIAGNOSIS — E785 Hyperlipidemia, unspecified: Secondary | ICD-10-CM

## 2017-07-01 DIAGNOSIS — Z789 Other specified health status: Secondary | ICD-10-CM

## 2017-07-10 ENCOUNTER — Other Ambulatory Visit (INDEPENDENT_AMBULATORY_CARE_PROVIDER_SITE_OTHER): Payer: Medicare Other

## 2017-07-10 ENCOUNTER — Ambulatory Visit (INDEPENDENT_AMBULATORY_CARE_PROVIDER_SITE_OTHER): Payer: Medicare Other

## 2017-07-10 VITALS — BP 124/82 | HR 57 | Temp 98.1°F | Ht 73.5 in | Wt 239.5 lb

## 2017-07-10 DIAGNOSIS — Z0000 Encounter for general adult medical examination without abnormal findings: Secondary | ICD-10-CM | POA: Diagnosis not present

## 2017-07-10 DIAGNOSIS — Z125 Encounter for screening for malignant neoplasm of prostate: Secondary | ICD-10-CM

## 2017-07-10 DIAGNOSIS — Z7289 Other problems related to lifestyle: Secondary | ICD-10-CM

## 2017-07-10 DIAGNOSIS — E785 Hyperlipidemia, unspecified: Secondary | ICD-10-CM | POA: Diagnosis not present

## 2017-07-10 DIAGNOSIS — Z789 Other specified health status: Secondary | ICD-10-CM | POA: Diagnosis not present

## 2017-07-10 LAB — LIPID PANEL
Cholesterol: 215 mg/dL — ABNORMAL HIGH (ref 0–200)
HDL: 71.9 mg/dL (ref >39.00)
LDL Cholesterol: 126 mg/dL — ABNORMAL HIGH (ref 0–99)
NonHDL: 142.95
Total CHOL/HDL Ratio: 3
Triglycerides: 83 mg/dL (ref 0.0–149.0)
VLDL: 16.6 mg/dL (ref 0.0–40.0)

## 2017-07-10 LAB — CBC WITH DIFFERENTIAL/PLATELET
Basophils Absolute: 0.1 10*3/uL (ref 0.0–0.1)
Basophils Relative: 1.5 % (ref 0.0–3.0)
Eosinophils Absolute: 0.5 10*3/uL (ref 0.0–0.7)
Eosinophils Relative: 7.7 % — ABNORMAL HIGH (ref 0.0–5.0)
HCT: 46.9 % (ref 39.0–52.0)
Hemoglobin: 16.1 g/dL (ref 13.0–17.0)
Lymphocytes Relative: 34.2 % (ref 12.0–46.0)
Lymphs Abs: 2 10*3/uL (ref 0.7–4.0)
MCHC: 34.4 g/dL (ref 30.0–36.0)
MCV: 100.8 fl — ABNORMAL HIGH (ref 78.0–100.0)
Monocytes Absolute: 0.5 10*3/uL (ref 0.1–1.0)
Monocytes Relative: 9.2 % (ref 3.0–12.0)
Neutro Abs: 2.8 10*3/uL (ref 1.4–7.7)
Neutrophils Relative %: 47.4 % (ref 43.0–77.0)
Platelets: 304 10*3/uL (ref 150.0–400.0)
RBC: 4.65 Mil/uL (ref 4.22–5.81)
RDW: 12.8 % (ref 11.5–15.5)
WBC: 5.8 10*3/uL (ref 4.0–10.5)

## 2017-07-10 LAB — COMPREHENSIVE METABOLIC PANEL
ALT: 23 U/L (ref 0–53)
AST: 21 U/L (ref 0–37)
Albumin: 4.5 g/dL (ref 3.5–5.2)
Alkaline Phosphatase: 53 U/L (ref 39–117)
BUN: 13 mg/dL (ref 6–23)
CO2: 29 mEq/L (ref 19–32)
Calcium: 9.8 mg/dL (ref 8.4–10.5)
Chloride: 105 mEq/L (ref 96–112)
Creatinine, Ser: 1.01 mg/dL (ref 0.40–1.50)
GFR: 76.71 mL/min (ref >60.00)
Glucose, Bld: 97 mg/dL (ref 70–99)
Potassium: 4.5 mEq/L (ref 3.5–5.1)
Sodium: 140 mEq/L (ref 135–145)
Total Bilirubin: 0.6 mg/dL (ref 0.2–1.2)
Total Protein: 7.3 g/dL (ref 6.0–8.3)

## 2017-07-10 LAB — PSA, MEDICARE: PSA: 1.05 ng/ml (ref 0.10–4.00)

## 2017-07-10 LAB — VITAMIN B12: Vitamin B-12: 335 pg/mL (ref 211–911)

## 2017-07-10 LAB — FOLATE: Folate: 23 ng/mL (ref >5.9)

## 2017-07-10 NOTE — Patient Instructions (Signed)
Patrick Sawyer , Thank you for taking time to come for your Medicare Wellness Visit. I appreciate your ongoing commitment to your health goals. Please review the following plan we discussed and let me know if I can assist you in the future.   These are the goals we discussed: Goals    . Increase physical activity          Starting 07/10/2017, I will continue to exercise for at least 90 min 3 days per week.        This is a list of the screening recommended for you and due dates:  Health Maintenance  Topic Date Due  . Flu Shot  02/11/2018*  . Colon Cancer Screening  04/14/2018  . DTaP/Tdap/Td vaccine (2 - Td) 11/14/2021  . Tetanus Vaccine  11/14/2021  . Pneumonia vaccines  Completed  *Topic was postponed. The date shown is not the original due date.   Preventive Care for Adults  A healthy lifestyle and preventive care can promote health and wellness. Preventive health guidelines for adults include the following key practices.  . A routine yearly physical is a good way to check with your health care provider about your health and preventive screening. It is a chance to share any concerns and updates on your health and to receive a thorough exam.  . Visit your dentist for a routine exam and preventive care every 6 months. Brush your teeth twice a day and floss once a day. Good oral hygiene prevents tooth decay and gum disease.  . The frequency of eye exams is based on your age, health, family medical history, use  of contact lenses, and other factors. Follow your health care provider's ecommendations for frequency of eye exams.  . Eat a healthy diet. Foods like vegetables, fruits, whole grains, low-fat dairy products, and lean protein foods contain the nutrients you need without too many calories. Decrease your intake of foods high in solid fats, added sugars, and salt. Eat the right amount of calories for you. Get information about a proper diet from your health care provider, if  necessary.  . Regular physical exercise is one of the most important things you can do for your health. Most adults should get at least 150 minutes of moderate-intensity exercise (any activity that increases your heart rate and causes you to sweat) each week. In addition, most adults need muscle-strengthening exercises on 2 or more days a week.  Silver Sneakers may be a benefit available to you. To determine eligibility, you may visit the website: www.silversneakers.com or contact program at [phone] Mon-Fri between 8AM-8PM.   . Maintain a healthy weight. The body mass index (BMI) is a screening tool to identify possible weight problems. It provides an estimate of body fat based on height and weight. Your health care provider can find your BMI and can help you achieve or maintain a healthy weight.   For adults 20 years and older: ? A BMI below 18.5 is considered underweight. ? A BMI of 18.5 to 24.9 is normal. ? A BMI of 25 to 29.9 is considered overweight. ? A BMI of 30 and above is considered obese.   . Maintain normal blood lipids and cholesterol levels by exercising and minimizing your intake of saturated fat. Eat a balanced diet with plenty of fruit and vegetables. Blood tests for lipids and cholesterol should begin at age 43 and be repeated every 5 years. If your lipid or cholesterol levels are high, you are over 50, or you  are at high risk for heart disease, you may need your cholesterol levels checked more frequently. Ongoing high lipid and cholesterol levels should be treated with medicines if diet and exercise are not working.  . If you smoke, find out from your health care provider how to quit. If you do not use tobacco, please do not start.  . If you choose to drink alcohol, please do not consume more than 2 drinks per day. One drink is considered to be 12 ounces (355 mL) of beer, 5 ounces (148 mL) of wine, or 1.5 ounces (44 mL) of liquor.  . If you are 66-51 years old, ask your  health care provider if you should take aspirin to prevent strokes.  . Use sunscreen. Apply sunscreen liberally and repeatedly throughout the day. You should seek shade when your shadow is shorter than you. Protect yourself by wearing long sleeves, pants, a wide-brimmed hat, and sunglasses year round, whenever you are outdoors.  . Once a month, do a whole body skin exam, using a mirror to look at the skin on your back. Tell your health care provider of new moles, moles that have irregular borders, moles that are larger than a pencil eraser, or moles that have changed in shape or color.

## 2017-07-10 NOTE — Progress Notes (Signed)
Subjective:   Saahas Sawyer is a 76 y.o. male who presents for Medicare Annual/Subsequent preventive examination.  Review of Systems:  N/A Cardiac Risk Factors include: advanced age (>24men, >64 women);dyslipidemia;obesity (BMI >30kg/m2);male gender     Objective:    Vitals: BP 124/82 (BP Location: Right Arm, Patient Position: Sitting, Cuff Size: Normal)   Pulse (!) 57   Temp 98.1 F (36.7 C) (Oral)   Ht 6' 1.5" (1.867 m) Comment: shoes  Wt 239 lb 8 oz (108.6 kg)   SpO2 95%   BMI 31.17 kg/m   Body mass index is 31.17 kg/m.  Tobacco History  Smoking Status  . Former Smoker  . Quit date: 11/14/1989  Smokeless Tobacco  . Never Used     Counseling given: No   Past Medical History:  Diagnosis Date  . AAA (abdominal aortic aneurysm) ([place])    stable over last 3 yrs  . Alcohol use longstanding   quits cold [place] for lent  . Cervical arthritis ([place])   . 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 HLD (hyperlipidemia)   . Obesity, Class I, BMI 30-34.9   . Seasonal allergies   . Traumatic enucleation of left eye    Past Surgical History:  Procedure Laterality Date  . APPENDECTOMY  1959  . COLONOSCOPY  01/2015   rpt 3 yrs Patrick Sawyer)  . ENUCLEATION Left 1969  . HEMORRHOID BANDING  04/2015   Patrick Tamala Julian  . MENISCUS REPAIR Left 2005  . MOHS SURGERY  04/2014  . VARICOSE VEIN SURGERY  2006   Family History  Problem Relation Age of Onset  . Alcohol abuse Father   . Alcohol abuse Brother   . Arthritis Mother        knee  . Cancer Brother        lung (smoker)  . Cancer Brother        colon  . Hypertension Brother   . CAD Brother        CABG (smoker, EtOH)  . Diabetes Neg Hx    History  Sexual Activity  . Sexual activity: No    Outpatient Encounter Prescriptions as of 07/10/2017  Medication Sig  . aspirin 81 MG EC tablet Take 81 mg by mouth daily. Swallow whole.  Marland Kitchen glucosamine-chondroitin 500-400 MG  tablet Take 2 tablets by mouth daily.  . Multiple Vitamins-Minerals (MULTIVITAMIN ADULT PO) Take 1 capsule by mouth daily.  . Omega-3 Fatty Acids (FISH OIL) 1000 MG CAPS Take 1 capsule by mouth daily.   No facility-administered encounter medications on file as of 07/10/2017.     Activities of Daily Living In your present state of health, do you have any difficulty performing the following activities: 07/10/2017  Hearing? Y  Vision? Y  Difficulty concentrating or making decisions? N  Walking or climbing stairs? Y  Dressing or bathing? N  Doing errands, shopping? N  Preparing Food and eating ? N  Using the Toilet? N  In the past six months, have you accidently leaked urine? N  Do you have problems with loss of bowel control? N  Managing your Medications? N  Managing your Finances? N  Housekeeping or managing your Housekeeping? N  Some recent data might be hidden    Patient Care Team: Patrick Bush, MD as PCP - General (Family Medicine) Patrick Bathe, MD as Consulting Physician (Dermatology) Patrick Burly Deirdre Peer, MD as Referring Physician (Ophthalmology) Patrick Silvas,  MD as Consulting Physician (Gastroenterology)   Assessment:     Visual Acuity Screening   Right eye Left eye Both eyes  Without correction:     With correction: 20/30 prosthesis 20/30  Hearing Screening Comments: Right ear - hearing aid Left ear - total hearing loss   Exercise Activities and Dietary recommendations Current Exercise Habits: Home exercise routine, Type of exercise: strength training/weights;Other - see comments (aerobics, resistance training), Time (Minutes): > 60, Frequency (Times/Week): 3, Weekly Exercise (Minutes/Week): 0, Intensity: Moderate, Exercise limited by: None identified  Goals    . Increase physical activity          Starting 07/10/2017, I will continue to exercise for at least 90 min 3 days per week.       Fall Risk Fall Risk  06/29/2016  Falls in the past year?  No   Depression Screen PHQ 2/9 Scores 07/10/2017 06/29/2016  PHQ - 2 Score 0 0  PHQ- 9 Score 1 -    Cognitive Function MMSE - Mini Mental State Exam 07/10/2017 06/29/2016  Orientation to time 5 5  Orientation to Place 5 5  Registration 3 3  Attention/ Calculation 0 0  Recall 3 3  Language- name 2 objects 0 0  Language- repeat 1 1  Language- follow 3 step command 3 3  Language- read & follow direction 0 0  Write a sentence 0 0  Copy design 0 0  Total score 20 20     PLEASE NOTE: A Mini-Cog screen was completed. Maximum score is 20. A value of 0 denotes this part of Folstein MMSE was not completed or the patient failed this part of the Mini-Cog screening.   Mini-Cog Screening Orientation to Time - Max 5 pts Orientation to Place - Max 5 pts Registration - Max 3 pts Recall - Max 3 pts Language Repeat - Max 1 pts Language Follow 3 Step Command - Max 3 pts     Immunization History  Administered Date(s) Administered  . Influenza, High Dose Seasonal PF 08/04/2016  . Pneumococcal Polysaccharide-23 06/29/2016   Screening Tests Health Maintenance  Topic Date Due  . INFLUENZA VACCINE  02/11/2018 (Originally 06/14/2017)  . COLONOSCOPY  04/14/2018  . DTaP/Tdap/Td (2 - Td) 11/14/2021  . TETANUS/TDAP  11/14/2021  . PNA vac Low Risk Adult  Completed      Plan:  I have personally reviewed and addressed the Medicare Annual Wellness questionnaire and have noted the following in the patient's chart:  A. Medical and social history B. Use of alcohol, tobacco or illicit drugs  C. Current medications and supplements D. Functional ability and status E.  Nutritional status F.  Physical activity G. Advance directives H. List of other physicians I.  Hospitalizations, surgeries, and ER visits in previous 12 months J.  [place] to include hearing, vision, cognitive, depression L. Referrals and appointments - none  In addition, I have reviewed and discussed with patient certain  preventive protocols, quality metrics, and best practice recommendations. A written personalized care plan for preventive services as well as general preventive health recommendations were provided to patient.  See attached scanned questionnaire for additional information.   Signed,   Patrick Sawyer, MHA, BS, LPN Health Coach

## 2017-07-10 NOTE — Progress Notes (Signed)
Pre visit review using our clinic review tool, if applicable. No additional management support is needed unless otherwise documented below in the visit note. 

## 2017-07-10 NOTE — Progress Notes (Signed)
PCP notes:   Health maintenance:  Flu vaccine - addressed  Abnormal screenings:   Depression score: 1  Patient concerns:   None  Nurse concerns:  None  Next PCP appt:   07/14/17 @ 0930

## 2017-07-14 ENCOUNTER — Ambulatory Visit (INDEPENDENT_AMBULATORY_CARE_PROVIDER_SITE_OTHER): Payer: Medicare Other | Admitting: Family Medicine

## 2017-07-14 ENCOUNTER — Encounter: Payer: Self-pay | Admitting: Family Medicine

## 2017-07-14 VITALS — BP 116/78 | HR 57 | Temp 97.7°F | Ht 74.0 in | Wt 236.5 lb

## 2017-07-14 DIAGNOSIS — I77811 Abdominal aortic ectasia: Secondary | ICD-10-CM | POA: Diagnosis not present

## 2017-07-14 DIAGNOSIS — Z7289 Other problems related to lifestyle: Secondary | ICD-10-CM

## 2017-07-14 DIAGNOSIS — E785 Hyperlipidemia, unspecified: Secondary | ICD-10-CM | POA: Diagnosis not present

## 2017-07-14 DIAGNOSIS — Z789 Other specified health status: Secondary | ICD-10-CM | POA: Diagnosis not present

## 2017-07-14 DIAGNOSIS — Z7189 Other specified counseling: Secondary | ICD-10-CM

## 2017-07-14 NOTE — Patient Instructions (Addendum)
If interested, check with pharmacy about new 2 shot shingles series (shingrix).  Keep slowly tapering alcohol.  Bring me copy of your advanced directive to update your chart.  Return in 1 year for next medicare wellness visit with Katha Cabal and follow up with me.   Health Maintenance, Male A healthy lifestyle and preventive care is important for your health and wellness. Ask your health care provider about what schedule of regular examinations is right for you. What should I know about weight and diet? Eat a Healthy Diet  Eat plenty of vegetables, fruits, whole grains, low-fat dairy products, and lean protein.  Do not eat a lot of foods high in solid fats, added sugars, or salt.  Maintain a Healthy Weight Regular exercise can help you achieve or maintain a healthy weight. You should:  Do at least 150 minutes of exercise each week. The exercise should increase your heart rate and make you sweat (moderate-intensity exercise).  Do strength-training exercises at least twice a week.  Watch Your Levels of Cholesterol and Blood Lipids  Have your blood tested for lipids and cholesterol every 5 years starting at 74 years of age. If you are at high risk for heart disease, you should start having your blood tested when you are 74 years old. You may need to have your cholesterol levels checked more often if: ? Your lipid or cholesterol levels are high. ? You are older than 74 years of age. ? You are at high risk for heart disease.  What should I know about cancer screening? Many types of cancers can be detected early and may often be prevented. Lung Cancer  You should be screened every year for lung cancer if: ? You are a current smoker who has smoked for at least 30 years. ? You are a former smoker who has quit within the past 15 years.  Talk to your health care provider about your screening options, when you should start screening, and how often you should be screened.  Colorectal  Cancer  Routine colorectal cancer screening usually begins at 74 years of age and should be repeated every 5-10 years until you are 74 years old. You may need to be screened more often if early forms of precancerous polyps or small growths are found. Your health care provider may recommend screening at an earlier age if you have risk factors for colon cancer.  Your health care provider may recommend using home test kits to check for hidden blood in the stool.  A small camera at the end of a tube can be used to examine your colon (sigmoidoscopy or colonoscopy). This checks for the earliest forms of colorectal cancer.  Prostate and Testicular Cancer  Depending on your age and overall health, your health care provider may do certain tests to screen for prostate and testicular cancer.  Talk to your health care provider about any symptoms or concerns you have about testicular or prostate cancer.  Skin Cancer  Check your skin from head to toe regularly.  Tell your health care provider about any new moles or changes in moles, especially if: ? There is a change in a mole's size, shape, or color. ? You have a mole that is larger than a pencil eraser.  Always use sunscreen. Apply sunscreen liberally and repeat throughout the day.  Protect yourself by wearing long sleeves, pants, a wide-brimmed hat, and sunglasses when outside.  What should I know about heart disease, diabetes, and high blood pressure?  If you  are 13-51 years of age, have your blood pressure checked every 3-5 years. If you are 79 years of age or older, have your blood pressure checked every year. You should have your blood pressure measured twice-once when you are at a hospital or clinic, and once when you are not at a hospital or clinic. Record the average of the two measurements. To check your blood pressure when you are not at a hospital or clinic, you can use: ? An automated blood pressure machine at a pharmacy. ? A home blood  pressure monitor.  Talk to your health care provider about your target blood pressure.  If you are between 73-46 years old, ask your health care provider if you should take aspirin to prevent heart disease.  Have regular diabetes screenings by checking your fasting blood sugar level. ? If you are at a normal weight and have a low risk for diabetes, have this test once every three years after the age of 59. ? If you are overweight and have a high risk for diabetes, consider being tested at a younger age or more often.  A one-time screening for abdominal aortic aneurysm (AAA) by ultrasound is recommended for men aged 9-75 years who are current or former smokers. What should I know about preventing infection? Hepatitis B If you have a higher risk for hepatitis B, you should be screened for this virus. Talk with your health care provider to find out if you are at risk for hepatitis B infection. Hepatitis C Blood testing is recommended for:  Everyone born from [birth year] through 1963.  Anyone with known risk factors for hepatitis C.  Sexually Transmitted Diseases (STDs)  You should be screened each year for STDs including gonorrhea and chlamydia if: ? You are sexually active and are younger than 74 years of age. ? You are older than 74 years of age and your health care provider tells you that you are at risk for this type of infection. ? Your sexual activity has changed since you were last screened and you are at an increased risk for chlamydia or gonorrhea. Ask your health care provider if you are at risk.  Talk with your health care provider about whether you are at high risk of being infected with HIV. Your health care provider may recommend a prescription medicine to help prevent HIV infection.  What else can I do?  Schedule regular health, dental, and eye exams.  Stay current with your vaccines (immunizations).  Do not use any tobacco products, such as cigarettes, chewing tobacco, and  e-cigarettes. If you need help quitting, ask your health care provider.  Limit alcohol intake to no more than 2 drinks per day. One drink equals 12 ounces of beer, 5 ounces of wine, or 1 ounces of hard liquor.  Do not use street drugs.  Do not share needles.  Ask your health care provider for help if you need support or information about quitting drugs.  Tell your health care provider if you often feel depressed.  Tell your health care provider if you have ever been abused or do not feel safe at home. This information is not intended to replace advice given to you by your health care provider. Make sure you discuss any questions you have with your health care provider. Document Released: 04/28/2008 Document Revised: 06/29/2016 Document Reviewed: 08/04/2015 Elsevier Interactive Patient Education  [employer].

## 2017-07-14 NOTE — Assessment & Plan Note (Signed)
Encouraged continued slow taper of alcohol.

## 2017-07-14 NOTE — Assessment & Plan Note (Signed)
Advanced directive discussion - has at home. Wife is HCPOA. Asked to bring copy. 

## 2017-07-14 NOTE — Progress Notes (Signed)
BP 116/78   Pulse (!) 57   Temp 97.7 F (36.5 C) (Oral)   Ht 6\' 2"  (1.88 m)   Wt 236 lb 8 oz (107.3 kg)   SpO2 97%   BMI 30.36 kg/m    CC: AMW f/u visit Subjective:    Patient ID: Patrick Sawyer, male    DOB: 12/02/1940, 76 y.o.   MRN: [id number]  HPI: Patrick Sawyer is a 76 y.o. male presenting on 07/14/2017 for Annual Exam   Saw Katha Cabal earlier this week for medicare wellness visit. Note reviewed.  I never received records from Dr Minna Antis.  Noticed some worsening L knee discomfort and lower back worse in the mornings, attributes to arthritis.   Preventative: COLONOSCOPY 01/2015 rpt 3 yrs Tiffany Kocher) Prostate cancer screening - regularly screened until age 12. Would like to continue screening at this time.  Lung cancer screening - quit smoking 1991.  Flu shot yearly  Tetanus shot - will defer at this time Pneumovax 06/2016, thinks prevnar 4 yrs ago  zostavax ~2012 shingrix - discussed Advanced directive discussion - has at home. Wife is HCPOA. Asked to bring copy. Seat belt use discussed Sunscreen use and skin screen discussed. Sees derm yearly (Dr Nehemiah Massed).  Quit smoking 1991 Alcohol - continued 6 beers/day regularly. Gives up alcohol for lent - noted trouble sleeping this year.   Lives with wife Occupation: retired, was Optometrist Edu: BS accounting Activity: gym 3d/wk, stays busy with housework, traveling Diet: good water, fruits/vegetables daily  Relevant past medical, surgical, family and social history reviewed and updated as indicated. Interim medical history since our last visit reviewed. Allergies and medications reviewed and updated. Outpatient Medications Prior to Visit  Medication Sig Dispense Refill  . aspirin 81 MG EC tablet Take 81 mg by mouth daily. Swallow whole.    Marland Kitchen glucosamine-chondroitin 500-400 MG tablet Take 2 tablets by mouth daily.    . Multiple Vitamins-Minerals (MULTIVITAMIN ADULT PO) Take 1 capsule by mouth daily.    .  Omega-3 Fatty Acids (FISH OIL) 1000 MG CAPS Take 1 capsule by mouth daily.     No facility-administered medications prior to visit.      Per HPI unless specifically indicated in ROS section below Review of Systems     Objective:    BP 116/78   Pulse (!) 57   Temp 97.7 F (36.5 C) (Oral)   Ht 6\' 2"  (1.88 m)   Wt 236 lb 8 oz (107.3 kg)   SpO2 97%   BMI 30.36 kg/m   Wt Readings from Last 3 Encounters:  07/14/17 236 lb 8 oz (107.3 kg)  07/10/17 239 lb 8 oz (108.6 kg)  07/07/16 237 lb 8 oz (107.7 kg)    Physical Exam  Constitutional: He is oriented to person, place, and time. He appears well-developed and well-nourished. No distress.  HENT:  Head: Normocephalic and atraumatic.  Right Ear: Hearing, tympanic membrane, external ear and ear canal normal.  Left Ear: Hearing, tympanic membrane, external ear and ear canal normal.  Nose: Nose normal.  Mouth/Throat: Uvula is midline, oropharynx is clear and moist and mucous membranes are normal. No oropharyngeal exudate, posterior oropharyngeal edema or posterior oropharyngeal erythema.  Eyes: Pupils are equal, round, and reactive to light. Conjunctivae and EOM are normal. No scleral icterus.  Neck: Normal range of motion. Neck supple.  Cardiovascular: Normal rate, regular rhythm, normal heart sounds and intact distal pulses.   No murmur heard. Pulses:      Radial pulses  are 2+ on the right side, and 2+ on the left side.  Pulmonary/Chest: Effort normal and breath sounds normal. No respiratory distress. He has no wheezes. He has no rales.  Abdominal: Soft. Bowel sounds are normal. He exhibits no distension and no mass. There is no tenderness. There is no rebound and no guarding.  Genitourinary: Rectum normal and prostate normal. Rectal exam shows no external hemorrhoid, no internal hemorrhoid, no fissure, no mass, no tenderness and anal tone normal. Prostate is not enlarged (15gm) and not tender.  Musculoskeletal: Normal range of motion.  He exhibits no edema.  Lymphadenopathy:    He has no cervical adenopathy.  Neurological: He is alert and oriented to person, place, and time.  CN grossly intact, station and gait intact  Skin: Skin is warm and dry. No rash noted.  Psychiatric: He has a normal mood and affect. His behavior is normal. Judgment and thought content normal.  Nursing note and vitals reviewed.  Results for orders placed or performed in visit on 07/10/17  PSA, Medicare  Result Value Ref Range   PSA 1.05 0.10 - 4.00 ng/ml  CBC with Differential/Platelet  Result Value Ref Range   WBC 5.8 4.0 - 10.5 K/uL   RBC 4.65 4.22 - 5.81 Mil/uL   Hemoglobin 16.1 13.0 - 17.0 g/dL   HCT 46.9 39.0 - 52.0 %   MCV 100.8 (H) 78.0 - 100.0 fl   MCHC 34.4 30.0 - 36.0 g/dL   RDW 12.8 11.5 - 15.5 %   Platelets 304.0 150.0 - 400.0 K/uL   Neutrophils Relative % 47.4 43.0 - 77.0 %   Lymphocytes Relative 34.2 12.0 - 46.0 %   Monocytes Relative 9.2 3.0 - 12.0 %   Eosinophils Relative 7.7 (H) 0.0 - 5.0 %   Basophils Relative 1.5 0.0 - 3.0 %   Neutro Abs 2.8 1.4 - 7.7 K/uL   Lymphs Abs 2.0 0.7 - 4.0 K/uL   Monocytes Absolute 0.5 0.1 - 1.0 K/uL   Eosinophils Absolute 0.5 0.0 - 0.7 K/uL   Basophils Absolute 0.1 0.0 - 0.1 K/uL  Comprehensive metabolic panel  Result Value Ref Range   Sodium 140 135 - 145 mEq/L   Potassium 4.5 3.5 - 5.1 mEq/L   Chloride 105 96 - 112 mEq/L   CO2 29 19 - 32 mEq/L   Glucose, Bld 97 70 - 99 mg/dL   BUN 13 6 - 23 mg/dL   Creatinine, Ser 1.01 0.40 - 1.50 mg/dL   Total Bilirubin 0.6 0.2 - 1.2 mg/dL   Alkaline Phosphatase 53 39 - 117 U/L   AST 21 0 - 37 U/L   ALT 23 0 - 53 U/L   Total Protein 7.3 6.0 - 8.3 g/dL   Albumin 4.5 3.5 - 5.2 g/dL   Calcium 9.8 8.4 - 10.5 mg/dL   GFR 76.71 >60.00 mL/min  Lipid panel  Result Value Ref Range   Cholesterol 215 (H) 0 - 200 mg/dL   Triglycerides 83.0 0.0 - 149.0 mg/dL   HDL 71.90 >39.00 mg/dL   VLDL 16.6 0.0 - 40.0 mg/dL   LDL Cholesterol 126 (H) 0 - 99 mg/dL    Total CHOL/HDL Ratio 3    NonHDL 142.95   Folate  Result Value Ref Range   Folate 23.0 >5.9 ng/mL  Vitamin B12  Result Value Ref Range   Vitamin B-12 335 211 - 911 pg/mL      Assessment & Plan:  Prior records ordered again as I have not yet  received these.  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 Would be due for rpt [place] 2021.       Advanced care planning/counseling discussion    Advanced directive discussion - has at home. Wife is HCPOA. Asked to bring copy.      Alcohol use    Encouraged continued slow taper of alcohol.       HLD (hyperlipidemia) - Primary    Chronic, stable on fish oil.  The 10-year ASCVD risk score Mikey Bussing DC Brooke Bonito., et al., 2013) is: 18.1%   Values used to calculate the score:     Age: 65 years     Sex: Male     Is Non-Hispanic African American: No     Diabetic: No     Tobacco smoker: No     Systolic Blood Pressure: 188 mmHg     Is BP treated: No     HDL Cholesterol: 71.9 mg/dL     Total Cholesterol: 215 mg/dL           Follow up plan: Return in about 1 year (around 07/14/2018) for medicare wellness visit, follow up visit.  Ria Bush, MD

## 2017-07-14 NOTE — Assessment & Plan Note (Addendum)
Again requested records to review. Would be due for rpt [place] 2021.

## 2017-07-14 NOTE — Assessment & Plan Note (Signed)
Chronic, stable on fish oil.  The 10-year ASCVD risk score Mikey Bussing DC Brooke Bonito., et al., 2013) is: 18.1%   Values used to calculate the score:     Age: 76 years     Sex: Male     Is Non-Hispanic African American: No     Diabetic: No     Tobacco smoker: No     Systolic Blood Pressure: 177 mmHg     Is BP treated: No     HDL Cholesterol: 71.9 mg/dL     Total Cholesterol: 215 mg/dL

## 2017-07-15 LAB — VITAMIN B1: VITAMIN B1 (THIAMINE): 10 nmol/L (ref 8–30)

## 2017-07-23 NOTE — Progress Notes (Signed)
I reviewed health advisor's note, was available for consultation, and agree with documentation and plan.  

## 2017-08-03 ENCOUNTER — Encounter: Payer: Self-pay | Admitting: Family Medicine

## 2017-08-07 DIAGNOSIS — H40001 Preglaucoma, unspecified, right eye: Secondary | ICD-10-CM | POA: Diagnosis not present

## 2017-08-10 DIAGNOSIS — L820 Inflamed seborrheic keratosis: Secondary | ICD-10-CM | POA: Diagnosis not present

## 2017-08-10 DIAGNOSIS — L578 Other skin changes due to chronic exposure to nonionizing radiation: Secondary | ICD-10-CM | POA: Diagnosis not present

## 2017-08-10 DIAGNOSIS — L821 Other seborrheic keratosis: Secondary | ICD-10-CM | POA: Diagnosis not present

## 2018-04-04 DIAGNOSIS — D485 Neoplasm of uncertain behavior of skin: Secondary | ICD-10-CM | POA: Diagnosis not present

## 2018-04-04 DIAGNOSIS — D225 Melanocytic nevi of trunk: Secondary | ICD-10-CM | POA: Diagnosis not present

## 2018-04-04 DIAGNOSIS — L578 Other skin changes due to chronic exposure to nonionizing radiation: Secondary | ICD-10-CM | POA: Diagnosis not present

## 2018-04-04 DIAGNOSIS — L821 Other seborrheic keratosis: Secondary | ICD-10-CM | POA: Diagnosis not present

## 2018-04-04 DIAGNOSIS — I788 Other diseases of capillaries: Secondary | ICD-10-CM | POA: Diagnosis not present

## 2018-04-04 DIAGNOSIS — L918 Other hypertrophic disorders of the skin: Secondary | ICD-10-CM | POA: Diagnosis not present

## 2018-04-04 DIAGNOSIS — Z1283 Encounter for screening for malignant neoplasm of skin: Secondary | ICD-10-CM | POA: Diagnosis not present

## 2018-04-04 DIAGNOSIS — Z85828 Personal history of other malignant neoplasm of skin: Secondary | ICD-10-CM | POA: Diagnosis not present

## 2018-04-04 DIAGNOSIS — I8393 Asymptomatic varicose veins of bilateral lower extremities: Secondary | ICD-10-CM | POA: Diagnosis not present

## 2018-04-04 DIAGNOSIS — L812 Freckles: Secondary | ICD-10-CM | POA: Diagnosis not present

## 2018-04-04 DIAGNOSIS — D1801 Hemangioma of skin and subcutaneous tissue: Secondary | ICD-10-CM | POA: Diagnosis not present

## 2018-07-11 ENCOUNTER — Other Ambulatory Visit: Payer: Self-pay | Admitting: Family Medicine

## 2018-07-11 DIAGNOSIS — E538 Deficiency of other specified B group vitamins: Secondary | ICD-10-CM | POA: Insufficient documentation

## 2018-07-11 DIAGNOSIS — Z125 Encounter for screening for malignant neoplasm of prostate: Secondary | ICD-10-CM

## 2018-07-11 DIAGNOSIS — E785 Hyperlipidemia, unspecified: Secondary | ICD-10-CM

## 2018-07-12 ENCOUNTER — Ambulatory Visit (INDEPENDENT_AMBULATORY_CARE_PROVIDER_SITE_OTHER): Payer: Medicare Other

## 2018-07-12 VITALS — BP 120/82 | HR 60 | Temp 98.0°F | Ht 73.5 in | Wt 233.5 lb

## 2018-07-12 DIAGNOSIS — Z0000 Encounter for general adult medical examination without abnormal findings: Secondary | ICD-10-CM | POA: Diagnosis not present

## 2018-07-12 DIAGNOSIS — E538 Deficiency of other specified B group vitamins: Secondary | ICD-10-CM | POA: Diagnosis not present

## 2018-07-12 DIAGNOSIS — E785 Hyperlipidemia, unspecified: Secondary | ICD-10-CM | POA: Diagnosis not present

## 2018-07-12 DIAGNOSIS — Z125 Encounter for screening for malignant neoplasm of prostate: Secondary | ICD-10-CM

## 2018-07-12 LAB — LIPID PANEL
CHOL/HDL RATIO: 3
Cholesterol: 213 mg/dL — ABNORMAL HIGH (ref 0–200)
HDL: 77.2 mg/dL (ref >39.00)
LDL CALC: 117 mg/dL — AB (ref 0–99)
NONHDL: 135.95
TRIGLYCERIDES: 94 mg/dL (ref 0.0–149.0)
VLDL: 18.8 mg/dL (ref 0.0–40.0)

## 2018-07-12 LAB — BASIC METABOLIC PANEL
BUN: 15 mg/dL (ref 6–23)
CALCIUM: 9.7 mg/dL (ref 8.4–10.5)
CHLORIDE: 106 meq/L (ref 96–112)
CO2: 31 meq/L (ref 19–32)
CREATININE: 1.03 mg/dL (ref 0.40–1.50)
GFR: 74.79 mL/min (ref >60.00)
GLUCOSE: 94 mg/dL (ref 70–99)
Potassium: 4.8 mEq/L (ref 3.5–5.1)
Sodium: 142 mEq/L (ref 135–145)

## 2018-07-12 LAB — VITAMIN B12: Vitamin B-12: 823 pg/mL (ref 211–911)

## 2018-07-12 LAB — PSA, MEDICARE: PSA: 1.01 ng/mL (ref 0.10–4.00)

## 2018-07-12 NOTE — Patient Instructions (Signed)
Patrick Sawyer , Thank you for taking time to come for your Medicare Wellness Visit. I appreciate your ongoing commitment to your health goals. Please review the following plan we discussed and let me know if I can assist you in the future.   These are the goals we discussed: Goals    . Increase physical activity     Starting 07/12/2018, I will continue to exercise for 90-120 minutes 3 days per week.        This is a list of the screening recommended for you and due dates:  Health Maintenance  Topic Date Due  . Flu Shot  02/13/2019*  . Colon Cancer Screening  04/14/2020  . DTaP/Tdap/Td vaccine (2 - Td) 11/14/2021  . Tetanus Vaccine  11/14/2021  . Pneumonia vaccines  Completed  *Topic was postponed. The date shown is not the original due date.   Preventive Care for Adults  A healthy lifestyle and preventive care can promote health and wellness. Preventive health guidelines for adults include the following key practices.  . A routine yearly physical is a good way to check with your health care provider about your health and preventive screening. It is a chance to share any concerns and updates on your health and to receive a thorough exam.  . Visit your dentist for a routine exam and preventive care every 6 months. Brush your teeth twice a day and floss once a day. Good oral hygiene prevents tooth decay and gum disease.  . The frequency of eye exams is based on your age, health, family medical history, use  of contact lenses, and other factors. Follow your health care provider's recommendations for frequency of eye exams.  . Eat a healthy diet. Foods like vegetables, fruits, whole grains, low-fat dairy products, and lean protein foods contain the nutrients you need without too many calories. Decrease your intake of foods high in solid fats, added sugars, and salt. Eat the right amount of calories for you. Get information about a proper diet from your health care provider, if  necessary.  . Regular physical exercise is one of the most important things you can do for your health. Most adults should get at least 150 minutes of moderate-intensity exercise (any activity that increases your heart rate and causes you to sweat) each week. In addition, most adults need muscle-strengthening exercises on 2 or more days a week.  Silver Sneakers may be a benefit available to you. To determine eligibility, you may visit the website: www.silversneakers.com or contact program at 470-423-2314 Mon-Fri between 8AM-8PM.   . Maintain a healthy weight. The body mass index (BMI) is a screening tool to identify possible weight problems. It provides an estimate of body fat based on height and weight. Your health care provider can find your BMI and can help you achieve or maintain a healthy weight.   For adults 20 years and older: ? A BMI below 18.5 is considered underweight. ? A BMI of 18.5 to 24.9 is normal. ? A BMI of 25 to 29.9 is considered overweight. ? A BMI of 30 and above is considered obese.   . Maintain normal blood lipids and cholesterol levels by exercising and minimizing your intake of saturated fat. Eat a balanced diet with plenty of fruit and vegetables. Blood tests for lipids and cholesterol should begin at age 37 and be repeated every 5 years. If your lipid or cholesterol levels are high, you are over 50, or you are at high risk for heart disease,  you may need your cholesterol levels checked more frequently. Ongoing high lipid and cholesterol levels should be treated with medicines if diet and exercise are not working.  . If you smoke, find out from your health care provider how to quit. If you do not use tobacco, please do not start.  . If you choose to drink alcohol, please do not consume more than 2 drinks per day. One drink is considered to be 12 ounces (355 mL) of beer, 5 ounces (148 mL) of wine, or 1.5 ounces (44 mL) of liquor.  . If you are 70-24 years old, ask your  health care provider if you should take aspirin to prevent strokes.  . Use sunscreen. Apply sunscreen liberally and repeatedly throughout the day. You should seek shade when your shadow is shorter than you. Protect yourself by wearing long sleeves, pants, a wide-brimmed hat, and sunglasses year round, whenever you are outdoors.  . Once a month, do a whole body skin exam, using a mirror to look at the skin on your back. Tell your health care provider of new moles, moles that have irregular borders, moles that are larger than a pencil eraser, or moles that have changed in shape or color.

## 2018-07-12 NOTE — Progress Notes (Signed)
PCP notes:   Health maintenance:  Flu vaccine - addressed  Abnormal screenings:   None  Patient concerns:   None  Nurse concerns:  None  Next PCP appt:   07/17/18 @ 8299

## 2018-07-12 NOTE — Progress Notes (Signed)
Subjective:   Atley Neubert is a 77 y.o. male who presents for Medicare Annual/Subsequent preventive examination.  Review of Systems:  N/A Cardiac Risk Factors include: advanced age (>34men, >95 women);male gender;obesity (BMI >30kg/m2)     Objective:    Vitals: BP 120/82 (BP Location: Right Arm, Patient Position: Sitting, Cuff Size: Normal)   Pulse 60   Temp 98 F (36.7 C) (Oral)   Ht 6' 1.5" (1.867 m) Comment: shoes  Wt 233 lb 8 oz (105.9 kg)   SpO2 97%   BMI 30.39 kg/m   Body mass index is 30.39 kg/m.  Advanced Directives 07/12/2018 07/10/2017 06/29/2016  Does Patient Have a Medical Advance Directive? Yes Yes Yes  Type of Paramedic of [place];Living will [place];Living will [place];Living will  Copy of [place] in Chart? No - copy requested No - copy requested -    Tobacco Social History   Tobacco Use  Smoking Status Former Smoker  . Last attempt to quit: 11/14/1989  . Years since quitting: 28.6  Smokeless Tobacco Never Used     Counseling given: No   Clinical Intake:  Pre-visit preparation completed: Yes  Pain : No/denies pain Pain Score: 0-No pain     Nutritional Status: BMI > 30  Obese Nutritional Risks: None Diabetes: No  How often do you need to have someone help you when you read instructions, pamphlets, or other written materials from your doctor or pharmacy?: 1 - Never What is the last grade level you completed in school?: Bachelors degree  Interpreter Needed?: No  Comments: pt lives with spouse Information entered by :: LPinson, LPN  Past Medical History:  Diagnosis Date  . AAA (abdominal aortic aneurysm) ([place])    stable over last 3 yrs  . Alcohol use longstanding   quits cold [place] for lent  . Cervical arthritis   . 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 HLD (hyperlipidemia)   . Obesity,  Class I, BMI 30-34.9   . Seasonal allergies   . Traumatic enucleation of left eye    Past Surgical History:  Procedure Laterality Date  . APPENDECTOMY  1959  . COLONOSCOPY  01/2015   small polyps, rpt 3 yrs Tiffany Kocher)  . COLONOSCOPY  06/2012   small polyps Vira Agar)  . ENUCLEATION Left 1969  . HEMORRHOID BANDING  04/2015   Dr Tamala Julian  . INGUINAL HERNIA REPAIR Right   . MENISCUS REPAIR Left 2005  . MOHS SURGERY  04/2014  . VARICOSE VEIN SURGERY  2006   Family History  Problem Relation Age of Onset  . Alcohol abuse Father   . Alcohol abuse Brother   . Arthritis Mother        knee  . Cancer Brother        lung (smoker)  . Cancer Brother        colon  . Hypertension Brother   . CAD Brother        CABG (smoker, EtOH)  . Diabetes Neg Hx    Social History   Socioeconomic History  . Marital status: Married    Spouse name: Not on file  . Number of children: Not on file  . Years of education: Not on file  . Highest education level: Not on file  Occupational History  . Not on file  Social Needs  . Financial resource strain: Not on file  .  Food insecurity:    Worry: Not on file    Inability: Not on file  . Transportation needs:    Medical: Not on file    Non-medical: Not on file  Tobacco Use  . Smoking status: Former Smoker    Last attempt to quit: 11/14/1989    Years since quitting: 28.6  . Smokeless tobacco: Never Used  Substance and Sexual Activity  . Alcohol use: Yes    Alcohol/week: 14.0 standard drinks    Types: 14 Shots of liquor per week    Comment: bourbon  . Drug use: No  . Sexual activity: Never  Lifestyle  . Physical activity:    Days per week: Not on file    Minutes per session: Not on file  . Stress: Not on file  Relationships  . Social connections:    Talks on phone: Not on file    Gets together: Not on file    Attends religious service: Not on file    Active member of club or organization: Not on file    Attends meetings of clubs or  organizations: Not on file    Relationship status: Not on file  Other Topics Concern  . Not on file  Social History Narrative   Lives with wife   Occupation: retired, was Optometrist   Edu: BS accounting   Activity: gym 3d/wk, stays busy with housework, traveling   Diet: good water, fruits/vegetables daily    Outpatient Encounter Medications as of 07/12/2018  Medication Sig  . aspirin 81 MG EC tablet Take 81 mg by mouth daily. Swallow whole.  . Cyanocobalamin (VITAMIN B-12 PO) Take 1,000 mcg by mouth daily.  Marland Kitchen glucosamine-chondroitin 500-400 MG tablet Take 2 tablets by mouth daily.  . Multiple Vitamins-Minerals (MULTIVITAMIN ADULT PO) Take 1 capsule by mouth daily.  . Omega-3 Fatty Acids (FISH OIL) 1000 MG CAPS Take 1 capsule by mouth daily.   No facility-administered encounter medications on file as of 07/12/2018.     Activities of Daily Living In your present state of health, do you have any difficulty performing the following activities: 07/12/2018  Hearing? Y  Vision? Y  Difficulty concentrating or making decisions? N  Walking or climbing stairs? N  Dressing or bathing? N  Doing errands, shopping? N  Preparing Food and eating ? N  Using the Toilet? N  In the past six months, have you accidently leaked urine? N  Do you have problems with loss of bowel control? N  Managing your Medications? N  Managing your Finances? N  Housekeeping or managing your Housekeeping? N  Some recent data might be hidden    Patient Care Team: Ria Bush, MD as PCP - General (Family Medicine) Ralene Bathe, MD as Consulting Physician (Dermatology) Karren Burly Deirdre Peer, MD as Referring Physician (Ophthalmology) Manya Silvas, MD as Consulting Physician (Gastroenterology)   Assessment:   This is a routine wellness examination for [place].  Hearing Screening Comments: Hearing aid (right ear), let ear (deaf) Vision Screening Comments: Vision exam in Aug 2018; future appt  scheduled in Sept 2019 @ [facility]   Exercise Activities and Dietary recommendations Current Exercise Habits: Home exercise routine, Type of exercise: strength training/weights;stretching, Time (Minutes): > 60(90-120 minutes), Frequency (Times/Week): 3, Weekly Exercise (Minutes/Week): 0, Intensity: Moderate, Exercise limited by: None identified  Goals    . Increase physical activity     Starting 07/12/2018, I will continue to exercise for 90-120 minutes 3 days per week.  Fall Risk Fall Risk  07/12/2018 07/14/2017 06/29/2016  Falls in the past year? No No No   Depression Screen PHQ 2/9 Scores 07/12/2018 07/10/2017 06/29/2016  PHQ - 2 Score 0 0 0  PHQ- 9 Score 0 1 -    Cognitive Function MMSE - Mini Mental State Exam 07/12/2018 07/10/2017 06/29/2016  Orientation to time 5 5 5   Orientation to Place 5 5 5   Registration 3 3 3   Attention/ Calculation 0 0 0  Recall 3 3 3   Language- name 2 objects 0 0 0  Language- repeat 1 1 1   Language- follow 3 step command 3 3 3   Language- read & follow direction 0 0 0  Write a sentence 0 0 0  Copy design 0 0 0  Total score 20 20 20         Immunization History  Administered Date(s) Administered  . Influenza, High Dose Seasonal PF 08/04/2016  . Pneumococcal Conjugate-13 03/16/2015  . Pneumococcal Polysaccharide-23 02/01/2010, 06/29/2016  . Td 07/27/2008  . Zoster 07/27/2006    Screening Tests Health Maintenance  Topic Date Due  . INFLUENZA VACCINE  02/13/2019 (Originally 06/14/2018)  . COLONOSCOPY  04/14/2020  . DTaP/Tdap/Td (2 - Td) 11/14/2021  . TETANUS/TDAP  11/14/2021  . PNA vac Low Risk Adult  Completed     Plan:     I have personally reviewed, addressed, and noted the following in the patient's chart:  A. Medical and social history B. Use of alcohol, tobacco or illicit drugs  C. Current medications and supplements D. Functional ability and status E.  Nutritional status F.  Physical activity G. Advance  directives H. List of other physicians I.  Hospitalizations, surgeries, and ER visits in previous 12 months J.  [place] to include hearing, vision, cognitive, depression L. Referrals and appointments - none  In addition, I have reviewed and discussed with patient certain preventive protocols, quality metrics, and best practice recommendations. A written personalized care plan for preventive services as well as general preventive health recommendations were provided to patient.  See attached scanned questionnaire for additional information.   Signed,   Lindell Noe, MHA, BS, LPN Health Coach

## 2018-07-13 NOTE — Progress Notes (Signed)
I reviewed health advisor's note, was available for consultation, and agree with documentation and plan.  

## 2018-07-17 ENCOUNTER — Encounter: Payer: Self-pay | Admitting: Family Medicine

## 2018-07-17 ENCOUNTER — Ambulatory Visit (INDEPENDENT_AMBULATORY_CARE_PROVIDER_SITE_OTHER): Payer: Medicare Other | Admitting: Family Medicine

## 2018-07-17 VITALS — BP 122/80 | HR 50 | Temp 97.8°F | Ht 73.5 in | Wt 233.0 lb

## 2018-07-17 DIAGNOSIS — E785 Hyperlipidemia, unspecified: Secondary | ICD-10-CM

## 2018-07-17 DIAGNOSIS — Z7289 Other problems related to lifestyle: Secondary | ICD-10-CM

## 2018-07-17 DIAGNOSIS — I77811 Abdominal aortic ectasia: Secondary | ICD-10-CM

## 2018-07-17 DIAGNOSIS — Z789 Other specified health status: Secondary | ICD-10-CM | POA: Diagnosis not present

## 2018-07-17 DIAGNOSIS — F1090 Alcohol use, unspecified, uncomplicated: Secondary | ICD-10-CM

## 2018-07-17 DIAGNOSIS — Z7189 Other specified counseling: Secondary | ICD-10-CM

## 2018-07-17 DIAGNOSIS — R001 Bradycardia, unspecified: Secondary | ICD-10-CM | POA: Diagnosis not present

## 2018-07-17 DIAGNOSIS — Z23 Encounter for immunization: Secondary | ICD-10-CM | POA: Diagnosis not present

## 2018-07-17 NOTE — Assessment & Plan Note (Signed)
Asymptomatic Will continue to monitor 

## 2018-07-17 NOTE — Progress Notes (Signed)
BP 122/80 (BP Location: Left Arm, Patient Position: Sitting, Cuff Size: Normal)   Pulse (!) 50   Temp 97.8 F (36.6 C) (Oral)   Ht 6' 1.5" (1.867 m)   Wt 233 lb (105.7 kg)   SpO2 96%   BMI 30.32 kg/m    CC: AMW f/u visit Subjective:    Patient ID: Patrick Sawyer, male    DOB: 10/29/1941, 77 y.o.   MRN: [id number]  HPI: Patrick Sawyer is a 77 y.o. male presenting on 07/17/2018 for Annual Exam (Pt 2.)   Saw Katha Cabal last week for medicare wellness visit. Note reviewed.   I received prior PCP records (Dr Minna Antis).  Chronic bradycardia - denies dyspnea, dizziness, or chest pain.   Preventative: COLONOSCOPY03/2016 benign HP polyp, pt thinksrec rpt 5 yrs Tiffany Kocher) Prostate cancer screening - discussed, would like to continue screening at this time.  Lung cancer screening - quit smoking 1991. Not eligible.  Flu shot yearly  Tetanus shot - will defer at this time Pneumovax 06/2016, prevnar 2016 zostavax 2007 shingrix - discussed  Advanced directive discussion - has at home. Wife is HCPOA. Asked to bring copy. Seat belt use discussed Sunscreen use and skin screen discussed. Sees derm yearly (Dr Nehemiah Massed).  Quit smoking 1991  Alcohol - has stopped beer, drinking 2 shots bourbon/night. Bourbon helps sleep. Gives up alcohol for lent.  Dentist Q6 mo Eye exam - yearly  Lives with wife Occupation: retired, was Optometrist Edu: BS accounting Activity: gym 3d/wk, stays busy with housework, traveling Diet: good water, fruits/vegetables daily  Relevant past medical, surgical, family and social history reviewed and updated as indicated. Interim medical history since our last visit reviewed. Allergies and medications reviewed and updated. Outpatient Medications Prior to Visit  Medication Sig Dispense Refill  . aspirin 81 MG EC tablet Take 81 mg by mouth daily. Swallow whole.    . Cyanocobalamin (VITAMIN B-12 PO) Take 1,000 mcg by mouth daily.    Marland Kitchen  glucosamine-chondroitin 500-400 MG tablet Take 2 tablets by mouth daily.    . Multiple Vitamins-Minerals (MULTIVITAMIN ADULT PO) Take 1 capsule by mouth daily.    . Omega-3 Fatty Acids (FISH OIL) 1000 MG CAPS Take 1 capsule by mouth daily.     No facility-administered medications prior to visit.      Per HPI unless specifically indicated in ROS section below Review of Systems     Objective:    BP 122/80 (BP Location: Left Arm, Patient Position: Sitting, Cuff Size: Normal)   Pulse (!) 50   Temp 97.8 F (36.6 C) (Oral)   Ht 6' 1.5" (1.867 m)   Wt 233 lb (105.7 kg)   SpO2 96%   BMI 30.32 kg/m   Wt Readings from Last 3 Encounters:  07/17/18 233 lb (105.7 kg)  07/12/18 233 lb 8 oz (105.9 kg)  07/14/17 236 lb 8 oz (107.3 kg)    Physical Exam  Constitutional: He is oriented to person, place, and time. He appears well-developed and well-nourished. No distress.  HENT:  Head: Normocephalic and atraumatic.  Right Ear: Tympanic membrane, external ear and ear canal normal. Decreased hearing is noted.  Left Ear: Tympanic membrane, external ear and ear canal normal. Decreased hearing is noted.  Nose: Nose normal.  Mouth/Throat: Uvula is midline, oropharynx is clear and moist and mucous membranes are normal. No oropharyngeal exudate, posterior oropharyngeal edema or posterior oropharyngeal erythema.  Wears hearing aides in right ear L ear congenitally deaf  Eyes: Pupils are equal,  round, and reactive to light. Conjunctivae and EOM are normal. No scleral icterus.  L artificial eye  Neck: Normal range of motion. Neck supple.  Cardiovascular: Normal rate, regular rhythm, normal heart sounds and intact distal pulses.  No murmur heard. Pulses:      Radial pulses are 2+ on the right side, and 2+ on the left side.  Pulmonary/Chest: Effort normal and breath sounds normal. No respiratory distress. He has no wheezes. He has no rales.  Abdominal: Soft. Bowel sounds are normal. He exhibits no  distension and no mass. There is no tenderness. There is no rebound and no guarding.  Genitourinary: Rectum normal. Rectal exam shows no external hemorrhoid, no internal hemorrhoid, no fissure, no mass and no tenderness. Prostate is enlarged (25gm). Prostate is not tender.  Musculoskeletal: Normal range of motion. He exhibits no edema.  Lymphadenopathy:    He has no cervical adenopathy.  Neurological: He is alert and oriented to person, place, and time.  CN grossly intact, station and gait intact  Skin: Skin is warm and dry. No rash noted.  Psychiatric: He has a normal mood and affect. His behavior is normal. Judgment and thought content normal.  Nursing note and vitals reviewed.  Results for orders placed or performed in visit on 07/12/18  Vitamin B12  Result Value Ref Range   Vitamin B-12 823 211 - 911 pg/mL  PSA, Medicare  Result Value Ref Range   PSA 1.01 0.10 - 4.00 ng/ml  Basic metabolic panel  Result Value Ref Range   Sodium 142 135 - 145 mEq/L   Potassium 4.8 3.5 - 5.1 mEq/L   Chloride 106 96 - 112 mEq/L   CO2 31 19 - 32 mEq/L   Glucose, Bld 94 70 - 99 mg/dL   BUN 15 6 - 23 mg/dL   Creatinine, Ser 1.03 0.40 - 1.50 mg/dL   Calcium 9.7 8.4 - 10.5 mg/dL   GFR 74.79 >60.00 mL/min  Lipid panel  Result Value Ref Range   Cholesterol 213 (H) 0 - 200 mg/dL   Triglycerides 94.0 0.0 - 149.0 mg/dL   HDL 77.20 >39.00 mg/dL   VLDL 18.8 0.0 - 40.0 mg/dL   LDL Cholesterol 117 (H) 0 - 99 mg/dL   Total CHOL/HDL Ratio 3    NonHDL 135.95       Assessment & Plan:   Problem List Items Addressed This Visit    HLD (hyperlipidemia) - Primary    Chronic, mild. Only takes fish oil. The 10-year ASCVD risk score Mikey Bussing DC Brooke Bonito., et al., 2013) is: 20.5%   Values used to calculate the score:     Age: 34 years     Sex: Male     Is Non-Hispanic African American: No     Diabetic: No     Tobacco smoker: No     Systolic Blood Pressure: 841 mmHg     Is BP treated: No     HDL Cholesterol: 77.2  mg/dL     Total Cholesterol: 213 mg/dL       Bradycardia    Asymptomatic. Will continue to monitor.       Alcohol use    Has significantly decreased, only drinking bourbon daily.       Advanced care planning/counseling discussion    Advanced directive discussion - has at home. Wife is HCPOA. Asked to bring copy.      Abdominal aortic ectasia ([place])    Will be due for rpt [place] 2021.  No orders of the defined types were placed in this encounter.  No orders of the defined types were placed in this encounter.   Follow up plan: No follow-ups on file.  Ria Bush, MD

## 2018-07-17 NOTE — Assessment & Plan Note (Addendum)
Chronic, mild. Only takes fish oil. The 10-year ASCVD risk score Mikey Bussing DC Brooke Bonito., et al., 2013) is: 20.5%   Values used to calculate the score:     Age: 77 years     Sex: Male     Is Non-Hispanic African American: No     Diabetic: No     Tobacco smoker: No     Systolic Blood Pressure: 657 mmHg     Is BP treated: No     HDL Cholesterol: 77.2 mg/dL     Total Cholesterol: 213 mg/dL

## 2018-07-17 NOTE — Assessment & Plan Note (Signed)
Has significantly decreased, only drinking bourbon daily.

## 2018-07-17 NOTE — Patient Instructions (Addendum)
If interested, check with pharmacy about new 2 shot shingles series (shingrix).  Bring me copy of your advanced directive.  You are doing well today.  Return as needed or in 1 year for next wellness visit

## 2018-07-17 NOTE — Assessment & Plan Note (Signed)
Will be due for rpt [place] 2021.

## 2018-07-17 NOTE — Assessment & Plan Note (Signed)
Advanced directive discussion - has at home. Wife is HCPOA. Asked to bring copy. 

## 2019-04-10 DIAGNOSIS — L820 Inflamed seborrheic keratosis: Secondary | ICD-10-CM | POA: Diagnosis not present

## 2019-04-10 DIAGNOSIS — D1800 Hemangioma unspecified site: Secondary | ICD-10-CM | POA: Diagnosis not present

## 2019-04-10 DIAGNOSIS — Z85828 Personal history of other malignant neoplasm of skin: Secondary | ICD-10-CM | POA: Diagnosis not present

## 2019-04-10 DIAGNOSIS — I8393 Asymptomatic varicose veins of bilateral lower extremities: Secondary | ICD-10-CM | POA: Diagnosis not present

## 2019-04-10 DIAGNOSIS — D485 Neoplasm of uncertain behavior of skin: Secondary | ICD-10-CM | POA: Diagnosis not present

## 2019-04-10 DIAGNOSIS — L570 Actinic keratosis: Secondary | ICD-10-CM | POA: Diagnosis not present

## 2019-04-10 DIAGNOSIS — Z1283 Encounter for screening for malignant neoplasm of skin: Secondary | ICD-10-CM | POA: Diagnosis not present

## 2019-04-10 DIAGNOSIS — L578 Other skin changes due to chronic exposure to nonionizing radiation: Secondary | ICD-10-CM | POA: Diagnosis not present

## 2019-04-10 DIAGNOSIS — L812 Freckles: Secondary | ICD-10-CM | POA: Diagnosis not present

## 2019-04-10 DIAGNOSIS — D225 Melanocytic nevi of trunk: Secondary | ICD-10-CM | POA: Diagnosis not present

## 2019-04-10 DIAGNOSIS — B353 Tinea pedis: Secondary | ICD-10-CM | POA: Diagnosis not present

## 2019-04-10 DIAGNOSIS — L821 Other seborrheic keratosis: Secondary | ICD-10-CM | POA: Diagnosis not present

## 2019-04-24 DIAGNOSIS — L237 Allergic contact dermatitis due to plants, except food: Secondary | ICD-10-CM | POA: Diagnosis not present

## 2019-07-23 ENCOUNTER — Ambulatory Visit: Payer: Federal, State, Local not specified - PPO

## 2019-07-23 ENCOUNTER — Other Ambulatory Visit (INDEPENDENT_AMBULATORY_CARE_PROVIDER_SITE_OTHER): Payer: Medicare Other

## 2019-07-23 ENCOUNTER — Other Ambulatory Visit: Payer: Self-pay | Admitting: Family Medicine

## 2019-07-23 DIAGNOSIS — Z125 Encounter for screening for malignant neoplasm of prostate: Secondary | ICD-10-CM

## 2019-07-23 DIAGNOSIS — E538 Deficiency of other specified B group vitamins: Secondary | ICD-10-CM

## 2019-07-23 DIAGNOSIS — E785 Hyperlipidemia, unspecified: Secondary | ICD-10-CM

## 2019-07-23 LAB — PSA, MEDICARE: PSA: 1.01 ng/ml (ref 0.10–4.00)

## 2019-07-25 ENCOUNTER — Other Ambulatory Visit (INDEPENDENT_AMBULATORY_CARE_PROVIDER_SITE_OTHER): Payer: Medicare Other

## 2019-07-25 DIAGNOSIS — E785 Hyperlipidemia, unspecified: Secondary | ICD-10-CM

## 2019-07-25 LAB — BASIC METABOLIC PANEL
BUN: 18 mg/dL (ref 6–23)
CO2: 27 mEq/L (ref 19–32)
Calcium: 9.6 mg/dL (ref 8.4–10.5)
Chloride: 102 mEq/L (ref 96–112)
Creatinine, Ser: 1.01 mg/dL (ref 0.40–1.50)
GFR: 71.78 mL/min (ref >60.00)
Glucose, Bld: 88 mg/dL (ref 70–99)
Potassium: 4.9 mEq/L (ref 3.5–5.1)
Sodium: 140 mEq/L (ref 135–145)

## 2019-07-25 LAB — LIPID PANEL
Cholesterol: 214 mg/dL — ABNORMAL HIGH (ref 0–200)
HDL: 81.7 mg/dL (ref >39.00)
LDL Cholesterol: 122 mg/dL — ABNORMAL HIGH (ref 0–99)
NonHDL: 132.53
Total CHOL/HDL Ratio: 3
Triglycerides: 54 mg/dL (ref 0.0–149.0)
VLDL: 10.8 mg/dL (ref 0.0–40.0)

## 2019-07-26 ENCOUNTER — Encounter: Payer: Self-pay | Admitting: Family Medicine

## 2019-07-26 ENCOUNTER — Ambulatory Visit (INDEPENDENT_AMBULATORY_CARE_PROVIDER_SITE_OTHER): Payer: Medicare Other | Admitting: Family Medicine

## 2019-07-26 ENCOUNTER — Other Ambulatory Visit: Payer: Self-pay

## 2019-07-26 VITALS — BP 120/70 | HR 77 | Temp 97.8°F | Ht 71.75 in | Wt 232.6 lb

## 2019-07-26 DIAGNOSIS — E785 Hyperlipidemia, unspecified: Secondary | ICD-10-CM

## 2019-07-26 DIAGNOSIS — M4040 Postural lordosis, site unspecified: Secondary | ICD-10-CM | POA: Diagnosis not present

## 2019-07-26 DIAGNOSIS — IMO0002 Reserved for concepts with insufficient information to code with codable children: Secondary | ICD-10-CM

## 2019-07-26 DIAGNOSIS — S0572XS Avulsion of left eye, sequela: Secondary | ICD-10-CM

## 2019-07-26 DIAGNOSIS — Z7189 Other specified counseling: Secondary | ICD-10-CM | POA: Diagnosis not present

## 2019-07-26 DIAGNOSIS — Z7289 Other problems related to lifestyle: Secondary | ICD-10-CM

## 2019-07-26 DIAGNOSIS — Z23 Encounter for immunization: Secondary | ICD-10-CM | POA: Diagnosis not present

## 2019-07-26 DIAGNOSIS — I77811 Abdominal aortic ectasia: Secondary | ICD-10-CM | POA: Diagnosis not present

## 2019-07-26 DIAGNOSIS — Z0000 Encounter for general adult medical examination without abnormal findings: Secondary | ICD-10-CM | POA: Insufficient documentation

## 2019-07-26 DIAGNOSIS — Z789 Other specified health status: Secondary | ICD-10-CM

## 2019-07-26 NOTE — Progress Notes (Signed)
This visit was conducted in person.  BP 120/70 (BP Location: Left Arm, Patient Position: Sitting, Cuff Size: Large)   Pulse 77   Temp 97.8 F (36.6 C) (Temporal)   Ht 5' 11.75" (1.822 m)   Wt 232 lb 9 oz (105.5 kg)   SpO2 97%   BMI 31.76 kg/m    CC: AMW Subjective:    Patient ID: Patrick Sawyer, male    DOB: 12-13-1940, 78 y.o.   MRN: [id number]  HPI: Patrick Sawyer is a 78 y.o. male presenting on 07/26/2019 for Medicare Wellness   Did not see health advisor this year.    Hearing Screening   125Hz  250Hz  500Hz  1000Hz  2000Hz  3000Hz  4000Hz  6000Hz  8000Hz   Right ear:   40 40 20  0    Left ear:           Comments: Pt wears right ear hearing aid.  Not wearing today.  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   Vision Screening Comments: Last eye exam, 07/2018.  Has appt scheduled.     Office Visit from 07/26/2019 in [facility]  PHQ-2 Total Score  0      Fall Risk  07/26/2019 07/12/2018 07/14/2017 06/29/2016  Falls in the past year? 0 No No No   Has not been to gym since 01/2019. Has noted height loss over the past year.   Preventative: COLONOSCOPY03/2016 benign HP polyp, pt thinksrec rpt 5 yrs Tiffany Kocher) Prostate cancer screening - discussed, may consider stopping Lung cancer screening - quit smoking 1991. Not eligible.  Flu shot yearly  Tetanus shot - will defer at this time Pneumovax 06/2016, prevnar 2016 [id number] shingrix - discussed  Advanced directive discussion - has at home. Wife is HCPOA. Asked to bring copy. Seat belt use discussed  Sunscreen use and skin screen discussed. Sees derm yearly (Dr Nehemiah Massed).  Quit smoking 1991  Alcohol - has stopped beer, drinking 2 shots bourbon/night. Bourbon helps sleep. Gives up alcohol for lent.  Dentist Q6 mo Eye exam - yearly Bowel - no constipation. Known hemorrhoids.  Bladder - no incontinence  Lives with wife Occupation: retired, was Optometrist Edu: BS accounting Activity:  gym 3d/wk, stays busy with housework, traveling Diet: good water, fruits/vegetables daily     Relevant past medical, surgical, family and social history reviewed and updated as indicated. Interim medical history since our last visit reviewed. Allergies and medications reviewed and updated. Outpatient Medications Prior to Visit  Medication Sig Dispense Refill  . aspirin 81 MG EC tablet Take 81 mg by mouth daily. Swallow whole.    . Cyanocobalamin (VITAMIN B-12 PO) Take 1,000 mcg by mouth daily.    Marland Kitchen glucosamine-chondroitin 500-400 MG tablet Take 2 tablets by mouth daily.    . Multiple Vitamins-Minerals (MULTIVITAMIN ADULT PO) Take 1 capsule by mouth daily.    . Omega-3 Fatty Acids (FISH OIL) 1000 MG CAPS Take 1 capsule by mouth daily.     No facility-administered medications prior to visit.      Per HPI unless specifically indicated in ROS section below Review of Systems Objective:    BP 120/70 (BP Location: Left Arm, Patient Position: Sitting, Cuff Size: Large)   Pulse 77   Temp 97.8 F (36.6 C) (Temporal)   Ht 5' 11.75" (1.822 m)   Wt 232 lb 9 oz (105.5 kg)   SpO2 97%   BMI 31.76 kg/m   Wt Readings from Last 3 Encounters:  07/26/19 232 lb 9 oz (  105.5 kg)  07/17/18 233 lb (105.7 kg)  07/12/18 233 lb 8 oz (105.9 kg)    Ht Readings from Last 3 Encounters:  07/26/19 5' 11.75" (1.822 m)  07/17/18 6' 1.5" (1.867 m)  07/12/18 6' 1.5" (1.867 m)    Physical Exam Vitals signs and nursing note reviewed.  Constitutional:      General: He is not in acute distress.    Appearance: Normal appearance. He is well-developed. He is not ill-appearing.  HENT:     Head: Normocephalic and atraumatic.     Right Ear: Hearing, tympanic membrane, ear canal and external ear normal.     Left Ear: Hearing, tympanic membrane, ear canal and external ear normal.     Nose: Nose normal.     Mouth/Throat:     Pharynx: Uvula midline. No oropharyngeal exudate or posterior oropharyngeal erythema.  Eyes:      General: No scleral icterus.    Conjunctiva/sclera: Conjunctivae normal.     Pupils: Pupils are equal, round, and reactive to light.  Neck:     Musculoskeletal: Normal range of motion and neck supple.  Cardiovascular:     Rate and Rhythm: Normal rate and regular rhythm.     Pulses:          Radial pulses are 2+ on the right side and 2+ on the left side.     Heart sounds: Normal heart sounds. No murmur.  Pulmonary:     Effort: Pulmonary effort is normal. No respiratory distress.     Breath sounds: Normal breath sounds. No wheezing or rales.  Abdominal:     General: Bowel sounds are normal. There is no distension.     Palpations: Abdomen is soft. There is no mass.     Tenderness: There is no abdominal tenderness. There is no guarding or rebound.  Musculoskeletal: Normal range of motion.  Lymphadenopathy:     Cervical: No cervical adenopathy.  Skin:    General: Skin is warm and dry.     Findings: No rash.  Neurological:     General: No focal deficit present.     Mental Status: He is alert and oriented to person, place, and time.     Comments:  CN grossly intact, station and gait intact Recall 3/3 Calculation 5/5 [id number]  Psychiatric:        Mood and Affect: Mood normal.        Behavior: Behavior normal.        Thought Content: Thought content normal.        Judgment: Judgment normal.       Results for orders placed or performed in visit on 07/25/19  Lipid panel  Result Value Ref Range   Cholesterol 214 (H) 0 - 200 mg/dL   Triglycerides 54.0 0.0 - 149.0 mg/dL   HDL 81.70 >39.00 mg/dL   VLDL 10.8 0.0 - 40.0 mg/dL   LDL Cholesterol 122 (H) 0 - 99 mg/dL   Total CHOL/HDL Ratio 3    NonHDL [id number]   Basic metabolic panel  Result Value Ref Range   Sodium 140 135 - 145 mEq/L   Potassium 4.9 3.5 - 5.1 mEq/L   Chloride 102 96 - 112 mEq/L   CO2 27 19 - 32 mEq/L   Glucose, Bld 88 70 - 99 mg/dL   BUN 18 6 - 23 mg/dL   Creatinine, Ser 1.01 0.40 - 1.50 mg/dL   Calcium 9.6  8.4 - 10.5 mg/dL   GFR 71.78 >60.00 mL/min  Lab Results  Component Value Date   PSA 1.01 07/23/2019   PSA 1.01 07/12/2018   PSA 1.05 07/10/2017    Assessment & Plan:   Problem List Items Addressed This Visit    Traumatic enucleation of left eye   Medicare annual wellness visit, subsequent - Primary    I have personally reviewed the Medicare Annual Wellness questionnaire and have noted 1. The patient's medical and social history 2. Their use of alcohol, tobacco or illicit drugs 3. Their current medications and supplements 4. The patient's functional ability including ADL's, fall risks, home safety risks and hearing or visual impairment. Cognitive function has been assessed and addressed as indicated.  5. Diet and physical activity 6. Evidence for depression or mood disorders The patients weight, height, BMI have been recorded in the chart. I have made referrals, counseling and provided education to the patient based on review of the above and I have provided the pt with a written personalized care plan for preventive services. Provider list updated.. See scanned questionairre as needed for further documentation. Reviewed preventative protocols and updated unless pt declined.       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 Previously declined imaging. Has seen chiropractor without benefit.       HLD (hyperlipidemia)    Chronic, off Rx medication. Continues aspirin and fish oil.  The 10-year ASCVD risk score Mikey Bussing DC Brooke Bonito., et al., 2013) is: 21%   Values used to calculate the score:     Age: 32 years     Sex: Male     Is Non-Hispanic African American: No     Diabetic: No     Tobacco smoker: No     Systolic Blood Pressure: 123456 mmHg     Is BP treated: No     HDL Cholesterol: 81.7 mg/dL     Total Cholesterol: 214 mg/dL       Alcohol use    Significant use (4-5 drinks per day).  Encouraged limiting use to 2-3 drinks/day and less if able for overall health. No signs of alcohol dependence.       Advanced care planning/counseling discussion    Advanced directive discussion - has at home. Wife is HCPOA. Asked to bring copy.      Abdominal aortic ectasia (HCC)    Will be due for reimaging next year.        Other Visit Diagnoses    Need for influenza vaccination       Relevant Orders   Flu Vaccine QUAD High Dose(Fluad) (Completed)       No orders of the defined types were placed in this encounter.  Orders Placed This Encounter  Procedures  . Flu Vaccine QUAD High Dose(Fluad)   Patient instructions: Flu shot today If interested, check with pharmacy about new 2 shot shingles series (shingrix).  Bring [place] copy of your advanced directive at your convenience to update your chart.  Return as needed or in 1 year for next wellness visit.   Follow up plan: Return in about 1 year (around 07/25/2020) for medicare wellness visit.  Ria Bush, MD

## 2019-07-26 NOTE — Assessment & Plan Note (Signed)

## 2019-07-26 NOTE — Assessment & Plan Note (Addendum)
Will be due for reimaging next year.

## 2019-07-26 NOTE — Assessment & Plan Note (Signed)
Advanced directive discussion - has at home. Wife is HCPOA. Asked to bring copy. 

## 2019-07-26 NOTE — Assessment & Plan Note (Signed)
With some thoracic kyphosis - anticipate contributing to noted loss of height. Previously declined imaging. Has seen chiropractor without benefit.

## 2019-07-26 NOTE — Assessment & Plan Note (Signed)
Chronic, off Rx medication. Continues aspirin and fish oil.  The 10-year ASCVD risk score Mikey Bussing DC Brooke Bonito., et al., 2013) is: 21%   Values used to calculate the score:     Age: 78 years     Sex: Male     Is Non-Hispanic African American: No     Diabetic: No     Tobacco smoker: No     Systolic Blood Pressure: 123456 mmHg     Is BP treated: No     HDL Cholesterol: 81.7 mg/dL     Total Cholesterol: 214 mg/dL

## 2019-07-26 NOTE — Patient Instructions (Addendum)
Flu shot today If interested, check with pharmacy about new 2 shot shingles series (shingrix).  Bring [place] copy of your advanced directive at your convenience to update your chart.  Return as needed or in 1 year for next wellness visit.   Health Maintenance After Age 76 After age 72, you are at a higher risk for certain long-term diseases and infections as well as injuries from falls. Falls are a major cause of broken bones and head injuries in people who are older than age 23. Getting regular preventive care can help to keep you healthy and well. Preventive care includes getting regular testing and making lifestyle changes as recommended by your health care provider. Talk with your health care provider about:  Which screenings and tests you should have. A screening is a test that checks for a disease when you have no symptoms.  A diet and exercise plan that is right for you. What should I know about screenings and tests to prevent falls? Screening and testing are the best ways to find a health problem early. Early diagnosis and treatment give you the best chance of managing medical conditions that are common after age 28. Certain conditions and lifestyle choices may make you more likely to have a fall. Your health care provider may recommend:  Regular vision checks. Poor vision and conditions such as cataracts can make you more likely to have a fall. If you wear glasses, make sure to get your prescription updated if your vision changes.  Medicine review. Work with your health care provider to regularly review all of the medicines you are taking, including over-the-counter medicines. Ask your health care provider about any side effects that may make you more likely to have a fall. Tell your health care provider if any medicines that you take make you feel dizzy or sleepy.  Osteoporosis screening. Osteoporosis is a condition that causes the bones to get weaker. This can make the bones weak and cause  them to break more easily.  Blood pressure screening. Blood pressure changes and medicines to control blood pressure can make you feel dizzy.  Strength and balance checks. Your health care provider may recommend certain tests to check your strength and balance while standing, walking, or changing positions.  Foot health exam. Foot pain and numbness, as well as not wearing proper footwear, can make you more likely to have a fall.  Depression screening. You may be more likely to have a fall if you have a fear of falling, feel emotionally low, or feel unable to do activities that you used to do.  Alcohol use screening. Using too much alcohol can affect your balance and may make you more likely to have a fall. What actions can I take to lower my risk of falls? General instructions  Talk with your health care provider about your risks for falling. Tell your health care provider if: ? You fall. Be sure to tell your health care provider about all falls, even ones that seem minor. ? You feel dizzy, sleepy, or off-balance.  Take over-the-counter and prescription medicines only as told by your health care provider. These include any supplements.  Eat a healthy diet and maintain a healthy weight. A healthy diet includes low-fat dairy products, low-fat (lean) meats, and fiber from whole grains, beans, and lots of fruits and vegetables. Home safety  Remove any tripping hazards, such as rugs, cords, and clutter.  Install safety equipment such as grab bars in bathrooms and safety rails on stairs.  Keep rooms and walkways well-lit. Activity   Follow a regular exercise program to stay fit. This will help you maintain your balance. Ask your health care provider what types of exercise are appropriate for you.  If you need a cane or walker, use it as recommended by your health care provider.  Wear supportive shoes that have nonskid soles. Lifestyle  Do not drink alcohol if your health care provider  tells you not to drink.  If you drink alcohol, limit how much you have: ? 0-1 drink a day for women. ? 0-2 drinks a day for men.  Be aware of how much alcohol is in your drink. In the [place] one drink equals one typical bottle of beer (12 oz), one-half glass of wine (5 oz), or one shot of hard liquor (1 oz).  Do not use any products that contain nicotine or tobacco, such as cigarettes and e-cigarettes. If you need help quitting, ask your health care provider. Summary  Having a healthy lifestyle and getting preventive care can help to protect your health and wellness after age 64.  Screening and testing are the best way to find a health problem early and help you avoid having a fall. Early diagnosis and treatment give you the best chance for managing medical conditions that are more common for people who are older than age 28.  Falls are a major cause of broken bones and head injuries in people who are older than age 32. Take precautions to prevent a fall at home.  Work with your health care provider to learn what changes you can make to improve your health and wellness and to prevent falls. This information is not intended to replace advice given to you by your health care provider. Make sure you discuss any questions you have with your health care provider. Document Released: 09/13/2017 Document Revised: 02/21/2019 Document Reviewed: 09/13/2017 Elsevier Patient Education  2020 [employer].

## 2019-07-26 NOTE — Assessment & Plan Note (Signed)
Significant use (4-5 drinks per day). Encouraged limiting use to 2-3 drinks/day and less if able for overall health. No signs of alcohol dependence.

## 2019-07-30 ENCOUNTER — Encounter: Payer: Self-pay | Admitting: Family Medicine

## 2019-12-13 ENCOUNTER — Ambulatory Visit: Payer: Medicare Other

## 2019-12-21 ENCOUNTER — Ambulatory Visit: Payer: Medicare Other | Attending: Internal Medicine

## 2019-12-21 DIAGNOSIS — Z23 Encounter for immunization: Secondary | ICD-10-CM | POA: Insufficient documentation

## 2019-12-21 NOTE — Progress Notes (Signed)
   Covid-19 Vaccination Clinic  Name:  Takashi Atallah    MRN: [id number] DOB: 1941-01-01  12/21/2019  Mr. Yinger was observed post Covid-19 immunization for 15 minutes without incidence. He was provided with Vaccine Information Sheet and instruction to access the V-Safe system.   Mr. Craddick was instructed to call 911 with any severe reactions post vaccine: Marland Kitchen Difficulty breathing  . Swelling of your face and throat  . A fast heartbeat  . A bad rash all over your body  . Dizziness and weakness    Immunizations Administered    Name Date Dose VIS Date Route   Pfizer COVID-19 Vaccine 12/21/2019  4:32 PM 0.3 mL 10/25/2019 Intramuscular   Manufacturer: [place]   Lot: [id number]   [place]: [id number]

## 2020-01-15 ENCOUNTER — Ambulatory Visit: Payer: Medicare Other | Attending: Internal Medicine

## 2020-01-15 DIAGNOSIS — Z23 Encounter for immunization: Secondary | ICD-10-CM

## 2020-01-15 NOTE — Progress Notes (Signed)
   Covid-19 Vaccination Clinic  Name:  Patrick Sawyer    MRN: [id number] DOB: 1941/08/15  01/15/2020  Patrick Sawyer was observed post Covid-19 immunization for 15 minutes without incident. He was provided with Vaccine Information Sheet and instruction to access the V-Safe system.   Patrick Sawyer was instructed to call 911 with any severe reactions post vaccine: Marland Kitchen Difficulty breathing  . Swelling of face and throat  . A fast heartbeat  . A bad rash all over body  . Dizziness and weakness   Immunizations Administered    Name Date Dose VIS Date Route   Pfizer COVID-19 Vaccine 01/15/2020  1:08 PM 0.3 mL 10/25/2019 Intramuscular   Manufacturer: [place]   Lot: [id number]   [place]: [id number]

## 2020-01-24 ENCOUNTER — Telehealth: Payer: Self-pay | Admitting: Internal Medicine

## 2020-01-24 NOTE — Telephone Encounter (Signed)
Dr. Hilarie Fredrickson, pt stated that his previous GI MD has retired.  Pt requested you to perform his colonoscopy.  His previous colon reports are in Epic for review.  Please review and advise scheduling.

## 2020-01-27 ENCOUNTER — Encounter: Payer: Self-pay | Admitting: Internal Medicine

## 2020-01-27 NOTE — Telephone Encounter (Signed)
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

## 2020-02-13 HISTORY — PX: COLONOSCOPY: SHX174

## 2020-02-25 DIAGNOSIS — H353111 Nonexudative age-related macular degeneration, right eye, early dry stage: Secondary | ICD-10-CM | POA: Diagnosis not present

## 2020-02-26 ENCOUNTER — Ambulatory Visit (AMBULATORY_SURGERY_CENTER): Payer: Self-pay

## 2020-02-26 ENCOUNTER — Other Ambulatory Visit: Payer: Self-pay

## 2020-02-26 VITALS — Temp 97.9°F | Ht 71.75 in | Wt 236.4 lb

## 2020-02-26 DIAGNOSIS — Z86010 Personal history of colonic polyps: Secondary | ICD-10-CM

## 2020-02-26 MED ORDER — SUTAB 1479-225-188 MG PO TABS: 12.0000 | ORAL_TABLET | ORAL | 0 refills | Status: DC

## 2020-02-26 NOTE — Progress Notes (Signed)
No allergies to soy or egg Pt is not on blood thinners or diet pills Denies issues with sedation/intubation Denies atrial flutter/fib Denies constipation   Emmi instructions given to pt  Pt is aware of Covid safety and care partner requirements.  

## 2020-03-05 ENCOUNTER — Telehealth: Payer: Self-pay | Admitting: Internal Medicine

## 2020-03-05 DIAGNOSIS — Z86010 Personal history of colonic polyps: Secondary | ICD-10-CM

## 2020-03-05 MED ORDER — SUTAB 1479-225-188 MG PO TABS: 24.0000 | ORAL_TABLET | ORAL | 0 refills | Status: DC

## 2020-03-05 NOTE — Telephone Encounter (Signed)
RESENT SUTAB TO [facility] IN [place]- PT LM ON VM THAT IDENTIFIES PT'S NAME

## 2020-03-06 NOTE — Telephone Encounter (Signed)
Spoke with patient. Sutab sample on 3rd floor pt will pick this up today. Sutab [id number], exp 3/22.

## 2020-03-06 NOTE — Telephone Encounter (Signed)
Patient was told to pick up prep sample today please advise and let patient when its ready

## 2020-03-11 ENCOUNTER — Ambulatory Visit (AMBULATORY_SURGERY_CENTER): Payer: Medicare Other | Admitting: Internal Medicine

## 2020-03-11 ENCOUNTER — Other Ambulatory Visit: Payer: Self-pay

## 2020-03-11 ENCOUNTER — Encounter: Payer: Self-pay | Admitting: Internal Medicine

## 2020-03-11 VITALS — BP 129/66 | HR 51 | Temp 95.3°F | Resp 16 | Ht 71.0 in | Wt 236.0 lb

## 2020-03-11 DIAGNOSIS — K635 Polyp of colon: Secondary | ICD-10-CM

## 2020-03-11 DIAGNOSIS — D124 Benign neoplasm of descending colon: Secondary | ICD-10-CM

## 2020-03-11 DIAGNOSIS — Z86010 Personal history of colonic polyps: Secondary | ICD-10-CM | POA: Diagnosis not present

## 2020-03-11 MED ORDER — SODIUM CHLORIDE 0.9 % IV SOLN: 500.0000 mL | Freq: Once | INTRAVENOUS | Status: DC

## 2020-03-11 NOTE — Op Note (Signed)
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 The patient's tolerance of                            previous anesthesia was also reviewed. The risks                            and benefits of the procedure and the sedation                            options and risks were discussed with the patient.                            All questions were answered, and informed consent                            was obtained. Prior Anticoagulants: The patient has                            taken no previous anticoagulant or antiplatelet                            agents. ASA Grade Assessment: II - A patient with                            mild systemic disease. After reviewing the risks                            and benefits, the patient was deemed in  satisfactory condition to undergo the procedure.                           After obtaining informed consent, the colonoscope                            was passed under direct vision. Throughout the                            procedure, the patient's blood pressure, pulse, and                            oxygen saturations were monitored continuously. The                            Colonoscope  was introduced through the anus and                            advanced to the cecum, identified by appendiceal                            orifice and ileocecal valve. The colonoscopy was                            performed without difficulty. The patient tolerated                            the procedure well. The quality of the bowel                            preparation was good. The ileocecal valve,                            appendiceal orifice, and rectum were photographed. Scope In: 10:07:32 AM Scope Out: 10:23:00 AM Scope Withdrawal Time: 0 hours 13 minutes 4 seconds  Total Procedure Duration: 0 hours 15 minutes 28 seconds  Findings:                 The digital rectal exam was normal.                           A 6 mm polyp was found in the proximal descending                            colon. The polyp was sessile. The polyp was removed                            with a cold snare. Resection and retrieval were                            complete.                           Multiple small and large-mouthed diverticula were  found in the sigmoid colon, descending colon and                            transverse colon.                           Internal hemorrhoids were found during                            retroflexion. The hemorrhoids were medium-sized. Complications:            No immediate complications. Estimated Blood Loss:     Estimated blood loss was minimal. Impression:               - One 6 mm polyp in the proximal descending colon,                            removed with a cold snare. Resected and retrieved.                           - Diverticulosis in the sigmoid colon, in the                            descending colon and in the transverse colon.                           - Internal hemorrhoids. Recommendation:           - Patient has a contact number available for                            emergencies. The signs and symptoms of potential                             delayed complications were discussed with the                            patient. Return to normal activities tomorrow.                            Written discharge instructions were provided to the                            patient.                           - Resume previous diet.                           - Continue present medications.                           - Await pathology results.                           - Repeat colonoscopy is recommended. The  colonoscopy date will be determined after pathology                            results from today's exam become available for                            review. Jerene Bears, MD 03/11/2020 10:26:52 AM This report has been signed electronically.

## 2020-03-11 NOTE — Progress Notes (Signed)
Temp check by:JB Vital check by:DT  The patient states no changes in medical or surgical history since pre-visit screening on 02/26/20.

## 2020-03-11 NOTE — Patient Instructions (Signed)

## 2020-03-11 NOTE — Progress Notes (Signed)
Report to PACU, RN, vss, BBS= Clear.  

## 2020-03-11 NOTE — Progress Notes (Signed)
Called to room to assist during endoscopic procedure.  Patient ID and intended procedure confirmed with present staff. Received instructions for my participation in the procedure from the performing physician.  

## 2020-03-13 ENCOUNTER — Telehealth: Payer: Self-pay

## 2020-03-13 NOTE — Telephone Encounter (Signed)
Attempted to reach patient for post-procedure f/u call. No answer. Left message that we will make another attempt to reach him later today and for him to please not hesitate to call us if he has any questions/concerns regarding his care. 

## 2020-03-13 NOTE — Telephone Encounter (Signed)
Attempted to reach patient for post-procedure f/u call. No answer. Left message for him to please not hesitate to call us if he has any questions/concerns regarding his care. 

## 2020-03-16 ENCOUNTER — Encounter: Payer: Self-pay | Admitting: Internal Medicine

## 2020-06-15 ENCOUNTER — Ambulatory Visit (INDEPENDENT_AMBULATORY_CARE_PROVIDER_SITE_OTHER): Payer: Medicare Other | Admitting: Dermatology

## 2020-06-15 ENCOUNTER — Other Ambulatory Visit: Payer: Self-pay

## 2020-06-15 DIAGNOSIS — I781 Nevus, non-neoplastic: Secondary | ICD-10-CM | POA: Diagnosis not present

## 2020-06-15 DIAGNOSIS — L820 Inflamed seborrheic keratosis: Secondary | ICD-10-CM

## 2020-06-15 DIAGNOSIS — L578 Other skin changes due to chronic exposure to nonionizing radiation: Secondary | ICD-10-CM | POA: Diagnosis not present

## 2020-06-15 NOTE — Progress Notes (Signed)
   Follow-Up Visit   Subjective  Patrick Sawyer is a 79 y.o. male who presents for the following: check spots (L leg x 2, ~81yr, changing in size and color), check spot (L hand, comes and goes ~39yr), and check red spot (nose ~16m, no symptoms).  The following portions of the chart were reviewed this encounter and updated as appropriate:  Tobacco  Allergies  Meds  Problems  Med Hx  Surg Hx  Fam Hx     Review of Systems:  No other skin or systemic complaints except as noted in HPI or Assessment and Plan.  Objective  Well appearing patient in no apparent distress; mood and affect are within normal limits.  A focused examination was performed including face, L hand, L leg. Relevant physical exam findings are noted in the Assessment and Plan.  Objective  L nasal tip: Dilated blood vessel  Images    Objective  L wrist x 1, L medial knee x 2 (3): Erythematous keratotic or waxy stuck-on papule or plaque.    Assessment & Plan  Telangiectasia L nasal tip  Benign, discussed BBL $200 for the one area  Inflamed seborrheic keratosis (3) L wrist x 1, L medial knee x 2  Destruction of lesion - L wrist x 1, L medial knee x 2 Complexity: simple   Destruction method: cryotherapy   Informed consent: discussed and consent obtained   Timeout:  patient name, date of birth, surgical site, and procedure verified Lesion destroyed using liquid nitrogen: Yes   Region frozen until ice ball extended beyond lesion: Yes   Outcome: patient tolerated procedure well with no complications   Post-procedure details: wound care instructions given    Actinic Damage - diffuse scaly erythematous macules with underlying dyspigmentation - Recommend daily broad spectrum sunscreen SPF 30+ to sun-exposed areas, reapply every 2 hours as needed.  - Call for new or changing lesions.  Return in about 6 months (around 12/16/2020) for TBSE, recheck ISKs.   I, Othelia Pulling, RMA, am acting as scribe for  Sarina Ser, MD .  Documentation: I have reviewed the above documentation for accuracy and completeness, and I agree with the above.  Sarina Ser, MD

## 2020-06-15 NOTE — Patient Instructions (Signed)
Cryotherapy Aftercare  . Wash gently with soap and water everyday.   . Apply Vaseline and Band-Aid daily until healed.  

## 2020-06-16 ENCOUNTER — Encounter: Payer: Self-pay | Admitting: Dermatology

## 2020-07-23 ENCOUNTER — Other Ambulatory Visit: Payer: Self-pay | Admitting: Family Medicine

## 2020-07-23 DIAGNOSIS — E785 Hyperlipidemia, unspecified: Secondary | ICD-10-CM

## 2020-07-23 DIAGNOSIS — E538 Deficiency of other specified B group vitamins: Secondary | ICD-10-CM

## 2020-07-24 ENCOUNTER — Other Ambulatory Visit: Payer: Self-pay

## 2020-07-24 ENCOUNTER — Other Ambulatory Visit (INDEPENDENT_AMBULATORY_CARE_PROVIDER_SITE_OTHER): Payer: Medicare Other

## 2020-07-24 DIAGNOSIS — E538 Deficiency of other specified B group vitamins: Secondary | ICD-10-CM | POA: Diagnosis not present

## 2020-07-24 DIAGNOSIS — E785 Hyperlipidemia, unspecified: Secondary | ICD-10-CM

## 2020-07-24 LAB — COMPREHENSIVE METABOLIC PANEL
ALT: 22 U/L (ref 0–53)
AST: 22 U/L (ref 0–37)
Albumin: 4.4 g/dL (ref 3.5–5.2)
Alkaline Phosphatase: 59 U/L (ref 39–117)
BUN: 19 mg/dL (ref 6–23)
CO2: 29 mEq/L (ref 19–32)
Calcium: 9.2 mg/dL (ref 8.4–10.5)
Chloride: 104 mEq/L (ref 96–112)
Creatinine, Ser: 1.01 mg/dL (ref 0.40–1.50)
GFR: 71.59 mL/min (ref >60.00)
Glucose, Bld: 93 mg/dL (ref 70–99)
Potassium: 4.3 mEq/L (ref 3.5–5.1)
Sodium: 140 mEq/L (ref 135–145)
Total Bilirubin: 0.8 mg/dL (ref 0.2–1.2)
Total Protein: 7.3 g/dL (ref 6.0–8.3)

## 2020-07-24 LAB — CBC WITH DIFFERENTIAL/PLATELET
Basophils Absolute: 0.1 10*3/uL (ref 0.0–0.1)
Basophils Relative: 1.8 % (ref 0.0–3.0)
Eosinophils Absolute: 0.5 10*3/uL (ref 0.0–0.7)
Eosinophils Relative: 7.8 % — ABNORMAL HIGH (ref 0.0–5.0)
HCT: 45.9 % (ref 39.0–52.0)
Hemoglobin: 15.9 g/dL (ref 13.0–17.0)
Lymphocytes Relative: 37.7 % (ref 12.0–46.0)
Lymphs Abs: 2.4 10*3/uL (ref 0.7–4.0)
MCHC: 34.6 g/dL (ref 30.0–36.0)
MCV: 100.7 fl — ABNORMAL HIGH (ref 78.0–100.0)
Monocytes Absolute: 0.7 10*3/uL (ref 0.1–1.0)
Monocytes Relative: 11.2 % (ref 3.0–12.0)
Neutro Abs: 2.6 10*3/uL (ref 1.4–7.7)
Neutrophils Relative %: 41.5 % — ABNORMAL LOW (ref 43.0–77.0)
Platelets: 249 10*3/uL (ref 150.0–400.0)
RBC: 4.56 Mil/uL (ref 4.22–5.81)
RDW: 12.8 % (ref 11.5–15.5)
WBC: 6.2 10*3/uL (ref 4.0–10.5)

## 2020-07-24 LAB — VITAMIN B12: Vitamin B-12: 628 pg/mL (ref 211–911)

## 2020-07-24 LAB — LIPID PANEL
Cholesterol: 205 mg/dL — ABNORMAL HIGH (ref 0–200)
HDL: 84.3 mg/dL (ref >39.00)
LDL Cholesterol: 110 mg/dL — ABNORMAL HIGH (ref 0–99)
NonHDL: 121.17
Total CHOL/HDL Ratio: 2
Triglycerides: 57 mg/dL (ref 0.0–149.0)
VLDL: 11.4 mg/dL (ref 0.0–40.0)

## 2020-07-28 ENCOUNTER — Ambulatory Visit (INDEPENDENT_AMBULATORY_CARE_PROVIDER_SITE_OTHER): Payer: Medicare Other | Admitting: Family Medicine

## 2020-07-28 ENCOUNTER — Other Ambulatory Visit: Payer: Self-pay

## 2020-07-28 ENCOUNTER — Encounter: Payer: Self-pay | Admitting: Family Medicine

## 2020-07-28 VITALS — BP 134/78 | HR 60 | Temp 97.6°F | Ht 72.0 in | Wt 235.4 lb

## 2020-07-28 DIAGNOSIS — E785 Hyperlipidemia, unspecified: Secondary | ICD-10-CM | POA: Diagnosis not present

## 2020-07-28 DIAGNOSIS — Z7289 Other problems related to lifestyle: Secondary | ICD-10-CM | POA: Diagnosis not present

## 2020-07-28 DIAGNOSIS — I77811 Abdominal aortic ectasia: Secondary | ICD-10-CM | POA: Diagnosis not present

## 2020-07-28 DIAGNOSIS — E538 Deficiency of other specified B group vitamins: Secondary | ICD-10-CM

## 2020-07-28 DIAGNOSIS — Z7189 Other specified counseling: Secondary | ICD-10-CM | POA: Diagnosis not present

## 2020-07-28 DIAGNOSIS — Z0000 Encounter for general adult medical examination without abnormal findings: Secondary | ICD-10-CM | POA: Diagnosis not present

## 2020-07-28 DIAGNOSIS — Z789 Other specified health status: Secondary | ICD-10-CM

## 2020-07-28 DIAGNOSIS — I8393 Asymptomatic varicose veins of bilateral lower extremities: Secondary | ICD-10-CM | POA: Insufficient documentation

## 2020-07-28 NOTE — Assessment & Plan Note (Signed)

## 2020-07-28 NOTE — Progress Notes (Signed)
This visit was conducted in person.  BP 134/78 (BP Location: Left Arm, Patient Position: Sitting, Cuff Size: Large)   Pulse 60   Temp 97.6 F (36.4 C) (Temporal)   Ht 6' (1.829 m)   Wt 235 lb 6 oz (106.8 kg)   SpO2 95%   BMI 31.92 kg/m    CC: AMW Subjective:    Patient ID: Patrick Sawyer, male    DOB: [birth year], 79 y.o.   MRN: [id number]  HPI: Patrick Sawyer is a 79 y.o. male presenting on 07/28/2020 for Medicare Wellness   Did not see health advisor this year.    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   Vision Screening Comments: Last eye exam, 02/2020.    Office Visit from 07/28/2020 in [place] at [place]  PHQ-2 Total Score 0      Fall Risk  07/28/2020 07/26/2019 07/12/2018 07/14/2017 06/29/2016  Falls in the past year? 0 0 No No No    Due for aorta imaging Notes worsening varicose veins to R>L calves. No pain. H/o bilateral varicose vein surgery x2 (1970s and 2006).   Preventative: COLONOSCOPY03/2016benign HP polyp, pt thinksrecrpt39yrs Tiffany Kocher) COLONOSCOPY 02/2020 - 1 HP, diverticulosis, int hem ([place]rtle) Prostate cancer screening -discussed, may consider stopping Lung cancer screening - quit smoking 1991.Not eligible. Flu shot yearly  Tetanus shot - will defer at this time Pneumovax 06/2016,prevnar 2016 [place] 12/2019, 01/2020 [id number] shingrix - discussed  Advanced directive discussion - has at home. Wife is HCPOA. Asked to bring copy. Seat belt use discussed  Sunscreen use discussed. No changing moles. Sees derm yearly (Dr Nehemiah Massed).  Quit smoking 1991  Alcohol - drinking 2 shots bourbon/night. Bourbon helps sleep.Gives up alcohol for lent.  Dentist Q6 mo Eye exam - yearly Bowel - no diarrhea/constipation. Known hemorrhoids.  Bladder - no  incontinence.   Lives with wife Occupation: retired, was Optometrist Edu: BS accounting Activity: exercising at home with free weights  Diet: good water, fruits/vegetables daily      Relevant past medical, surgical, family and social history reviewed and updated as indicated. Interim medical history since our last visit reviewed. Allergies and medications reviewed and updated. Outpatient Medications Prior to Visit  Medication Sig Dispense Refill  . aspirin 81 MG EC tablet Take 81 mg by mouth daily. Swallow whole.    . Cyanocobalamin (VITAMIN B-12 PO) Take 1,000 mcg by mouth daily.    Marland Kitchen glucosamine-chondroitin 500-400 MG tablet Take 2 tablets by mouth daily.    . Multiple Vitamins-Minerals (MULTIVITAMIN ADULT PO) Take 1 capsule by mouth daily.    . Omega-3 Fatty Acids (FISH OIL) 1000 MG CAPS Take 1 capsule by mouth daily.     No facility-administered medications prior to visit.     Per HPI unless specifically indicated in ROS section below Review of Systems Objective:  BP 134/78 (BP Location: Left Arm, Patient Position: Sitting, Cuff Size: Large)   Pulse 60   Temp 97.6 F (36.4 C) (Temporal)   Ht 6' (1.829 m)   Wt 235 lb 6 oz (106.8 kg)   SpO2 95%   BMI 31.92 kg/m   Wt Readings from Last 3 Encounters:  07/28/20 235 lb 6 oz (106.8 kg)  03/11/20 236 lb (107 kg)  02/26/20 236 lb 6.4 oz (107.2 kg)   Ht Readings from Last 3 Encounters:  07/28/20 6' (1.829 m)  03/11/20 5\' 11"  (1.803 m)  02/26/20 5' 11.75" (1.822 m)     Physical Exam Vitals and nursing note reviewed.  Constitutional:      General: He is not in acute distress.    Appearance: Normal appearance. He is well-developed. He is not ill-appearing.  HENT:     Head: Normocephalic and atraumatic.     Right Ear: Hearing, tympanic membrane, ear canal and external ear normal.     Left Ear: Hearing, tympanic membrane, ear canal and external ear normal.  Eyes:     General: No scleral icterus.    Conjunctiva/sclera:  Conjunctivae normal.     Comments: L prosthetic eye present  Neck:     Thyroid: No thyroid mass, thyromegaly or thyroid tenderness.     Vascular: No carotid bruit.  Cardiovascular:     Rate and Rhythm: Normal rate and regular rhythm.     Pulses: Normal pulses.          Radial pulses are 2+ on the right side and 2+ on the left side.     Heart sounds: Normal heart sounds. No murmur heard.   Pulmonary:     Effort: Pulmonary effort is normal. No respiratory distress.     Breath sounds: Normal breath sounds. No wheezing, rhonchi or rales.  Abdominal:     General: Abdomen is flat. Bowel sounds are normal. There is no distension.     Palpations: Abdomen is soft. There is no mass.     Tenderness: There is no abdominal tenderness. There is no guarding or rebound.     Hernia: No hernia is present.  Musculoskeletal:        General: No tenderness. Normal range of motion.     Cervical back: Normal range of motion and neck supple.     Right lower leg: No edema.     Left lower leg: No edema.     Comments: Superficial varicose veins to R>>L calves   Lymphadenopathy:     Cervical: No cervical adenopathy.  Skin:    General: Skin is warm and dry.     Findings: No rash.  Neurological:     General: No focal deficit present.     Mental Status: He is alert and oriented to person, place, and time.     Comments:  CN grossly intact, station and gait intact Recall 3/3 Calculation 5/5 DLROW   Psychiatric:        Mood and Affect: Mood normal.        Behavior: Behavior normal.        Thought Content: Thought content normal.        Judgment: Judgment normal.       Results for orders placed or performed in visit on 07/24/20  CBC with Differential/Platelet  Result Value Ref Range   WBC 6.2 4.0 - 10.5 K/uL   RBC 4.56 4.22 - 5.81 Mil/uL   Hemoglobin 15.9 13.0 - 17.0 g/dL   HCT 45.9 39 - 52 %   MCV 100.7 (H) 78.0 - 100.0 fl   MCHC 34.6 30.0 - 36.0 g/dL   RDW 12.8 11.5 - 15.5 %   Platelets 249.0  150 - 400 K/uL   Neutrophils Relative % 41.5 (L) 43 - 77 %   Lymphocytes Relative 37.7 12 - 46 %   Monocytes Relative 11.2 3 -  12 %   Eosinophils Relative 7.8 (H) 0 - 5 %   Basophils Relative 1.8 0 - 3 %   Neutro Abs 2.6 1.4 - 7.7 K/uL   Lymphs Abs 2.4 0.7 - 4.0 K/uL   Monocytes Absolute 0.7 0 - 1 K/uL   Eosinophils Absolute 0.5 0 - 0 K/uL   Basophils Absolute 0.1 0 - 0 K/uL  Vitamin B12  Result Value Ref Range   Vitamin B-12 628 211 - 911 pg/mL  Comprehensive metabolic panel  Result Value Ref Range   Sodium 140 135 - 145 mEq/L   Potassium 4.3 3.5 - 5.1 mEq/L   Chloride 104 96 - 112 mEq/L   CO2 29 19 - 32 mEq/L   Glucose, Bld 93 70 - 99 mg/dL   BUN 19 6 - 23 mg/dL   Creatinine, Ser 1.01 0.40 - 1.50 mg/dL   Total Bilirubin 0.8 0.2 - 1.2 mg/dL   Alkaline Phosphatase 59 39 - 117 U/L   AST 22 0 - 37 U/L   ALT 22 0 - 53 U/L   Total Protein 7.3 6.0 - 8.3 g/dL   Albumin 4.4 3.5 - 5.2 g/dL   GFR 71.59 >60.00 mL/min   Calcium 9.2 8.4 - 10.5 mg/dL  Lipid panel  Result Value Ref Range   Cholesterol 205 (H) 0 - 200 mg/dL   Triglycerides 57.0 0 - 149 mg/dL   HDL 84.30 >39.00 mg/dL   VLDL 11.4 0.0 - 40.0 mg/dL   LDL Cholesterol 110 (H) 0 - 99 mg/dL   Total CHOL/HDL Ratio 2    NonHDL 121.17    Assessment & Plan:  This visit occurred during the SARS-CoV-2 public health emergency.  Safety protocols were in place, including screening questions prior to the visit, additional usage of staff PPE, and extensive cleaning of exam room while observing appropriate contact time as indicated for disinfecting solutions.   Problem List Items Addressed This Visit    Varicose veins of both lower extremities without ulcer or inflammation    Asymptomatic at this time.  Discussed conservative treatment with compression stockings.       Medicare annual wellness visit, subsequent - Primary    I have personally reviewed the Medicare Annual Wellness questionnaire and have noted 1. The patient's medical  and social history 2. Their use of alcohol, tobacco or illicit drugs 3. Their current medications and supplements 4. The patient's functional ability including ADL's, fall risks, home safety risks and hearing or visual impairment. Cognitive function has been assessed and addressed as indicated.  5. Diet and physical activity 6. Evidence for depression or mood disorders The patients weight, height, BMI have been recorded in the chart. I have made referrals, counseling and provided education to the patient based on review of the above and I have provided the pt with a written personalized care plan for preventive services. Provider list updated.. See scanned questionairre as needed for further documentation. Reviewed preventative protocols and updated unless pt declined.       Low serum vitamin B12    Well replaced on 1067mcg daily.       HLD (hyperlipidemia)    Chronic, mild off medication. High HDL is protective. Only on low dose fish oil.  The 10-year ASCVD risk score Mikey Bussing DC Brooke Bonito., et al., 2013) is: 26.1%   Values used to calculate the score:     Age: 13 years     Sex: Male     Is Non-Hispanic African American: No  Diabetic: No     Tobacco smoker: No     Systolic Blood Pressure: 478 mmHg     Is BP treated: No     HDL Cholesterol: 84.3 mg/dL     Total Cholesterol: 205 mg/dL       Alcohol use    Encouraged limiting alcohol to 2-3 drinks/day. Reviewed concerns with long term liver health.       Advanced care planning/counseling discussion    Advanced directive discussion - has at home. Wife is HCPOA. Asked to bring copy.      Abdominal aortic ectasia (HCC)    Update abdominal aortic ultrasound      Relevant Orders   VAS [place] AAA DUPLEX       No orders of the defined types were placed in this encounter.  No orders of the defined types were placed in this encounter.   Patient instructions: If interested, check with pharmacy about new 2 shot shingles series  (shingrix).  You are doing well today  I recommend compression stocking use for varicose veins, let me know if worsening or interested in vein doctor evaluation Bring me copy of your advanced directive  We will order abdominal aorta ultrasound.  Return as needed or in 1 year for next wellness visit   Follow up plan: Return in about 1 year (around 07/28/2021) for medicare wellness visit.  Ria Bush, MD

## 2020-07-28 NOTE — Patient Instructions (Addendum)
If interested, check with pharmacy about new 2 shot shingles series (shingrix).  You are doing well today  I recommend compression stocking use for varicose veins, let me know if worsening or interested in vein doctor evaluation Bring me copy of your advanced directive  We will order abdominal aorta ultrasound.  Return as needed or in 1 year for next wellness visit   Health Maintenance After Age 77 After age 28, you are at a higher risk for certain long-term diseases and infections as well as injuries from falls. Falls are a major cause of broken bones and head injuries in people who are older than age 37. Getting regular preventive care can help to keep you healthy and well. Preventive care includes getting regular testing and making lifestyle changes as recommended by your health care provider. Talk with your health care provider about:  Which screenings and tests you should have. A screening is a test that checks for a disease when you have no symptoms.  A diet and exercise plan that is right for you. What should I know about screenings and tests to prevent falls? Screening and testing are the best ways to find a health problem early. Early diagnosis and treatment give you the best chance of managing medical conditions that are common after age 37. Certain conditions and lifestyle choices may make you more likely to have a fall. Your health care provider may recommend:  Regular vision checks. Poor vision and conditions such as cataracts can make you more likely to have a fall. If you wear glasses, make sure to get your prescription updated if your vision changes.  Medicine review. Work with your health care provider to regularly review all of the medicines you are taking, including over-the-counter medicines. Ask your health care provider about any side effects that may make you more likely to have a fall. Tell your health care provider if any medicines that you take make you feel dizzy or  sleepy.  Osteoporosis screening. Osteoporosis is a condition that causes the bones to get weaker. This can make the bones weak and cause them to break more easily.  Blood pressure screening. Blood pressure changes and medicines to control blood pressure can make you feel dizzy.  Strength and balance checks. Your health care provider may recommend certain tests to check your strength and balance while standing, walking, or changing positions.  Foot health exam. Foot pain and numbness, as well as not wearing proper footwear, can make you more likely to have a fall.  Depression screening. You may be more likely to have a fall if you have a fear of falling, feel emotionally low, or feel unable to do activities that you used to do.  Alcohol use screening. Using too much alcohol can affect your balance and may make you more likely to have a fall. What actions can I take to lower my risk of falls? General instructions  Talk with your health care provider about your risks for falling. Tell your health care provider if: ? You fall. Be sure to tell your health care provider about all falls, even ones that seem minor. ? You feel dizzy, sleepy, or off-balance.  Take over-the-counter and prescription medicines only as told by your health care provider. These include any supplements.  Eat a healthy diet and maintain a healthy weight. A healthy diet includes low-fat dairy products, low-fat (lean) meats, and fiber from whole grains, beans, and lots of fruits and vegetables. Home safety  Remove any tripping  hazards, such as rugs, cords, and clutter.  Install safety equipment such as grab bars in bathrooms and safety rails on stairs.  Keep rooms and walkways well-lit. Activity   Follow a regular exercise program to stay fit. This will help you maintain your balance. Ask your health care provider what types of exercise are appropriate for you.  If you need a cane or walker, use it as recommended by  your health care provider.  Wear supportive shoes that have nonskid soles. Lifestyle  Do not drink alcohol if your health care provider tells you not to drink.  If you drink alcohol, limit how much you have: ? 0-1 drink a day for women. ? 0-2 drinks a day for men.  Be aware of how much alcohol is in your drink. In the [place] one drink equals one typical bottle of beer (12 oz), one-half glass of wine (5 oz), or one shot of hard liquor (1 oz).  Do not use any products that contain nicotine or tobacco, such as cigarettes and e-cigarettes. If you need help quitting, ask your health care provider. Summary  Having a healthy lifestyle and getting preventive care can help to protect your health and wellness after age 105.  Screening and testing are the best way to find a health problem early and help you avoid having a fall. Early diagnosis and treatment give you the best chance for managing medical conditions that are more common for people who are older than age 54.  Falls are a major cause of broken bones and head injuries in people who are older than age 80. Take precautions to prevent a fall at home.  Work with your health care provider to learn what changes you can make to improve your health and wellness and to prevent falls. This information is not intended to replace advice given to you by your health care provider. Make sure you discuss any questions you have with your health care provider. Document Revised: 02/21/2019 Document Reviewed: 09/13/2017 Elsevier Patient Education  2020 [employer].

## 2020-07-28 NOTE — Assessment & Plan Note (Signed)
Asymptomatic at this time.  Discussed conservative treatment with compression stockings.

## 2020-07-28 NOTE — Assessment & Plan Note (Signed)
Encouraged limiting alcohol to 2-3 drinks/day. Reviewed concerns with long term liver health.

## 2020-07-28 NOTE — Assessment & Plan Note (Signed)
Update abdominal aortic ultrasound

## 2020-07-28 NOTE — Assessment & Plan Note (Signed)
Well replaced on 1026mcg daily.

## 2020-07-28 NOTE — Assessment & Plan Note (Signed)
Chronic, mild off medication. High HDL is protective. Only on low dose fish oil.  The 10-year ASCVD risk score Mikey Bussing DC Brooke Bonito., et al., 2013) is: 26.1%   Values used to calculate the score:     Age: 79 years     Sex: Male     Is Non-Hispanic African American: No     Diabetic: No     Tobacco smoker: No     Systolic Blood Pressure: 338 mmHg     Is BP treated: No     HDL Cholesterol: 84.3 mg/dL     Total Cholesterol: 205 mg/dL

## 2020-07-28 NOTE — Assessment & Plan Note (Signed)
Advanced directive discussion - has at home. Wife is HCPOA. Asked to bring copy.

## 2020-08-13 ENCOUNTER — Other Ambulatory Visit: Payer: Self-pay

## 2020-08-13 ENCOUNTER — Ambulatory Visit (HOSPITAL_COMMUNITY)
Admission: RE | Admit: 2020-08-13 | Discharge: 2020-08-13 | Disposition: A | Discharge status: Home / Self Care | Payer: Medicare Other | Source: Ambulatory Visit | Attending: Cardiology | Admitting: Cardiology

## 2020-08-13 DIAGNOSIS — I77811 Abdominal aortic ectasia: Secondary | ICD-10-CM | POA: Diagnosis not present

## 2020-09-10 DIAGNOSIS — H903 Sensorineural hearing loss, bilateral: Secondary | ICD-10-CM | POA: Diagnosis not present

## 2020-09-12 ENCOUNTER — Ambulatory Visit: Payer: Medicare Other | Attending: Internal Medicine

## 2020-09-12 ENCOUNTER — Other Ambulatory Visit: Payer: Self-pay

## 2020-09-12 DIAGNOSIS — Z23 Encounter for immunization: Secondary | ICD-10-CM

## 2020-09-12 NOTE — Progress Notes (Signed)
   Covid-19 Vaccination Clinic  Name:  Patrick Sawyer    MRN: [id number] DOB: [birth year]  09/12/2020  [name] was observed post Covid-19 immunization for 15 minutes without incident. He was provided with Vaccine Information Sheet and instruction to access the V-Safe system.   [name] was instructed to call 911 with any severe reactions post vaccine: Marland Kitchen Difficulty breathing  . Swelling of face and throat  . A fast heartbeat  . A bad rash all over body  . Dizziness and weakness

## 2020-12-24 ENCOUNTER — Other Ambulatory Visit: Payer: Self-pay

## 2020-12-24 ENCOUNTER — Ambulatory Visit (INDEPENDENT_AMBULATORY_CARE_PROVIDER_SITE_OTHER): Payer: Medicare Other | Admitting: Dermatology

## 2020-12-24 ENCOUNTER — Encounter: Payer: Self-pay | Admitting: Dermatology

## 2020-12-24 DIAGNOSIS — L578 Other skin changes due to chronic exposure to nonionizing radiation: Secondary | ICD-10-CM | POA: Diagnosis not present

## 2020-12-24 DIAGNOSIS — L821 Other seborrheic keratosis: Secondary | ICD-10-CM

## 2020-12-24 DIAGNOSIS — Z1283 Encounter for screening for malignant neoplasm of skin: Secondary | ICD-10-CM

## 2020-12-24 DIAGNOSIS — R21 Rash and other nonspecific skin eruption: Secondary | ICD-10-CM | POA: Diagnosis not present

## 2020-12-24 DIAGNOSIS — L820 Inflamed seborrheic keratosis: Secondary | ICD-10-CM

## 2020-12-24 DIAGNOSIS — L814 Other melanin hyperpigmentation: Secondary | ICD-10-CM

## 2020-12-24 DIAGNOSIS — Z86018 Personal history of other benign neoplasm: Secondary | ICD-10-CM

## 2020-12-24 DIAGNOSIS — Z85828 Personal history of other malignant neoplasm of skin: Secondary | ICD-10-CM | POA: Diagnosis not present

## 2020-12-24 DIAGNOSIS — D229 Melanocytic nevi, unspecified: Secondary | ICD-10-CM | POA: Diagnosis not present

## 2020-12-24 DIAGNOSIS — D1800 Hemangioma unspecified site: Secondary | ICD-10-CM

## 2020-12-24 DIAGNOSIS — L570 Actinic keratosis: Secondary | ICD-10-CM

## 2020-12-24 DIAGNOSIS — B353 Tinea pedis: Secondary | ICD-10-CM

## 2020-12-24 MED ORDER — KETOCONAZOLE 2 % EX CREA: TOPICAL_CREAM | CUTANEOUS | 6 refills | Status: DC

## 2020-12-24 NOTE — Progress Notes (Signed)
Follow-Up Visit   Subjective  Patrick Sawyer is a 80 y.o. male who presents for the following: Annual Exam (Hx BCC, dysplastic nevi ). The patient presents for Total-Body Skin Exam (TBSE) for skin cancer screening and mole check. He has a lesion on his scalp that he hits with his comb while brushing his hair.   The following portions of the chart were reviewed this encounter and updated as appropriate:   Tobacco  Allergies  Meds  Problems  Med Hx  Surg Hx  Fam Hx     Review of Systems:  No other skin or systemic complaints except as noted in HPI or Assessment and Plan.  Objective  Well appearing patient in no apparent distress; mood and affect are within normal limits.  A focused examination was performed including extremities, including the arms, hands, fingers, and fingernails and the legs, feet, toes, and toenails. Relevant physical exam findings are noted in the Assessment and Plan.  Objective  Left Upper Arm - Anterior: Erythema   Objective  B/L feet: Scale of the feet   Objective  Hands x 7 (7): Erythematous thin papules/macules with gritty scale.   Objective  Scalp: Erythematous keratotic or waxy stuck-on papule or plaque.   Assessment & Plan  Rash and other nonspecific skin eruption Left Upper Arm - Anterior Normal immune reaction to shingles vaccine -  Follow up with PCP if any problems.  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 Start Ketoconazole 2% cream to the feet QHS.   ketoconazole (NIZORAL) 2 % cream - B/L feet  Start Ketoconazole 2% cream to the feet QHS.   ketoconazole (NIZORAL) 2 % cream - B/L feet  AK (actinic keratosis) (7) Hands x 7  Destruction of lesion - Hands x 7 Complexity: simple   Destruction method: cryotherapy   Informed consent: discussed and consent obtained   Timeout:  patient name, date of birth, surgical site, and procedure verified Lesion destroyed using liquid nitrogen: Yes   Region frozen  until ice ball extended beyond lesion: Yes   Outcome: patient tolerated procedure well with no complications   Post-procedure details: wound care instructions given    Inflamed seborrheic keratosis Scalp  Destruction of lesion - Scalp Complexity: simple   Destruction method: cryotherapy   Informed consent: discussed and consent obtained   Timeout:  patient name, date of birth, surgical site, and procedure verified Lesion destroyed using liquid nitrogen: Yes   Region frozen until ice ball extended beyond lesion: Yes   Outcome: patient tolerated procedure well with no complications   Post-procedure details: wound care instructions given    Skin cancer screening   Lentigines - Scattered tan macules - Discussed due to sun exposure - Benign, observe - Call for any changes  Seborrheic Keratoses - Stuck-on, waxy, tan-brown papules and plaques  - Discussed benign etiology and prognosis. - Observe - Call for any changes  Melanocytic Nevi - Tan-brown and/or pink-flesh-colored symmetric macules and papules - Benign appearing on exam today - Observation - Call clinic for new or changing moles - Recommend daily use of broad spectrum spf 30+ sunscreen to sun-exposed areas.   Hemangiomas - Red papules - Discussed benign nature - Observe - Call for any changes  Actinic Damage - Chronic, secondary to cumulative UV/sun exposure - diffuse scaly erythematous macules with underlying dyspigmentation - Recommend daily broad spectrum sunscreen SPF 30+ to sun-exposed areas, reapply every 2 hours as needed.  - Call for new or changing lesions.  History of Basal Cell Carcinoma of the Skin - No evidence of recurrence today - Recommend regular full body skin exams - Recommend daily broad spectrum sunscreen SPF 30+ to sun-exposed areas, reapply every 2 hours as needed.  - Call if any new or changing lesions are noted between office visits  History of Dysplastic Nevus - No evidence of  recurrence today - Recommend regular full body skin exams - Recommend daily broad spectrum sunscreen SPF 30+ to sun-exposed areas, reapply every 2 hours as needed.  - Call if any new or changing lesions are noted between office visits  Skin cancer screening performed today.  Return in about 1 year (around 12/24/2021) for TBSE - hx BCC, dysplastic nevus.  Luther Redo, CMA, am acting as scribe for Sarina Ser, MD .  Documentation: I have reviewed the above documentation for accuracy and completeness, and I agree with the above.  Sarina Ser, MD

## 2020-12-27 ENCOUNTER — Encounter: Payer: Self-pay | Admitting: Dermatology

## 2021-02-19 DIAGNOSIS — Z23 Encounter for immunization: Secondary | ICD-10-CM | POA: Diagnosis not present

## 2021-02-25 DIAGNOSIS — H353111 Nonexudative age-related macular degeneration, right eye, early dry stage: Secondary | ICD-10-CM | POA: Diagnosis not present

## 2021-04-21 ENCOUNTER — Other Ambulatory Visit: Payer: Self-pay

## 2021-04-21 ENCOUNTER — Ambulatory Visit (INDEPENDENT_AMBULATORY_CARE_PROVIDER_SITE_OTHER): Payer: Medicare Other | Admitting: Dermatology

## 2021-04-21 ENCOUNTER — Encounter: Payer: Self-pay | Admitting: Dermatology

## 2021-04-21 DIAGNOSIS — W57XXXA Bitten or stung by nonvenomous insect and other nonvenomous arthropods, initial encounter: Secondary | ICD-10-CM | POA: Diagnosis not present

## 2021-04-21 DIAGNOSIS — R21 Rash and other nonspecific skin eruption: Secondary | ICD-10-CM | POA: Diagnosis not present

## 2021-04-21 MED ORDER — MOMETASONE FUROATE 0.1 % EX CREA: TOPICAL_CREAM | CUTANEOUS | 2 refills | Status: DC

## 2021-04-21 NOTE — Patient Instructions (Addendum)
Topical steroids (such as triamcinolone, fluocinolone, fluocinonide, mometasone, clobetasol, halobetasol, betamethasone, hydrocortisone) can cause thinning and lightening of the skin if they are used for too long in the same area. Your physician has selected the right strength medicine for your problem and area affected on the body. Please use your medication only as directed by your physician to prevent side effects.    If you have any questions or concerns for your doctor, please call our main line at [phone] and press option 4 to reach your doctor's medical assistant. If no one answers, please leave a voicemail as directed and we will return your call as soon as possible. Messages left after 4 pm will be answered the following business day.   You may also send us a message via MyChart. We typically respond to MyChart messages within 1-2 business days.  For prescription refills, please ask your pharmacy to contact our office. Our fax number is [phone].  If you have an urgent issue when the clinic is closed that cannot wait until the next business day, you can page your doctor at the number below.    Please note that while we do our best to be available for urgent issues outside of office hours, we are not available 24/7.   If you have an urgent issue and are unable to reach us, you may choose to seek medical care at your doctor's office, retail clinic, urgent care center, or emergency room.  If you have a medical emergency, please immediately call 911 or go to the emergency department.  Pager Numbers  - Dr. Kowalski: [phone]  - Dr. Moye: [phone]  - Dr. Stewart: [phone]  In the event of inclement weather, please call our main line at [phone] for an update on the status of any delays or closures.  Dermatology Medication Tips: Please keep the boxes that topical medications come in in order to help keep track of the instructions about where and how to use  these. Pharmacies typically print the medication instructions only on the boxes and not directly on the medication tubes.   If your medication is too expensive, please contact our office at [phone] option 4 or send us a message through MyChart.   We are unable to tell what your co-pay for medications will be in advance as this is different depending on your insurance coverage. However, we may be able to find a substitute medication at lower cost or fill out paperwork to get insurance to cover a needed medication.   If a prior authorization is required to get your medication covered by your insurance company, please allow us 1-2 business days to complete this process.  Drug prices often vary depending on where the prescription is filled and some pharmacies may offer cheaper prices.  The website www.goodrx.com contains coupons for medications through different pharmacies. The prices here do not account for what the cost may be with help from insurance (it may be cheaper with your insurance), but the website can give you the price if you did not use any insurance.  - You can print the associated coupon and take it with your prescription to the pharmacy.  - You may also stop by our office during regular business hours and pick up a GoodRx coupon card.  - If you need your prescription sent electronically to a different pharmacy, notify our office through [facility] MyChart or by phone at [phone] option 4.  

## 2021-04-21 NOTE — Progress Notes (Signed)
   Follow-Up Visit   Subjective  Patrick Sawyer is a 80 y.o. male who presents for the following: Rash (Dur: few days. Thighs, chest, back. Itching. Does work outside in garden, has not noticed poison ivy. Has used OTC HC cream. Used Calamine, no help. ).  No new spots coming up.    The following portions of the chart were reviewed this encounter and updated as appropriate:       Review of Systems: No other skin or systemic complaints except as noted in HPI or Assessment and Plan.   Objective  Well appearing patient in no apparent distress; mood and affect are within normal limits.  A focused examination was performed including face, torso, thighs. Relevant physical exam findings are noted in the Assessment and Plan.  Medial Thighs, chest, back Multiple violaceous papules at medial thighs, erythema with excoriations at upper back/neck and chest.  Assessment & Plan  Rash  Bug bite without infection, initial encounter Medial Thighs, chest, back  Bite Reaction: thighs. Probable chiggers  Hypersensitivity reaction: chest/back/neck  Start Mometasone BID up to 2 weeks. Pt will call if not improving  mometasone (ELOCON) 0.1 % cream - Medial Thighs, chest, back Apply BID to affected areas up to 2 weeks PRN  Return if symptoms worsen or fail to improve.   I, Emelia Salisbury, CMA, am acting as scribe for Brendolyn Patty, MD.  Documentation: I have reviewed the above documentation for accuracy and completeness, and I agree with the above.  Brendolyn Patty MD

## 2021-06-09 ENCOUNTER — Emergency Department
Admission: EM | Admit: 2021-06-09 | Discharge: 2021-06-09 | Disposition: A | Discharge status: Home / Self Care | Payer: Medicare Other | Attending: Emergency Medicine | Admitting: Emergency Medicine

## 2021-06-09 ENCOUNTER — Other Ambulatory Visit: Payer: Self-pay

## 2021-06-09 ENCOUNTER — Encounter: Payer: Self-pay | Admitting: Intensive Care

## 2021-06-09 DIAGNOSIS — R0602 Shortness of breath: Secondary | ICD-10-CM | POA: Diagnosis not present

## 2021-06-09 DIAGNOSIS — Z87891 Personal history of nicotine dependence: Secondary | ICD-10-CM | POA: Insufficient documentation

## 2021-06-09 DIAGNOSIS — R0789 Other chest pain: Secondary | ICD-10-CM | POA: Insufficient documentation

## 2021-06-09 DIAGNOSIS — Z85828 Personal history of other malignant neoplasm of skin: Secondary | ICD-10-CM | POA: Insufficient documentation

## 2021-06-09 DIAGNOSIS — Z7982 Long term (current) use of aspirin: Secondary | ICD-10-CM | POA: Diagnosis not present

## 2021-06-09 DIAGNOSIS — T63441A Toxic effect of venom of bees, accidental (unintentional), initial encounter: Secondary | ICD-10-CM | POA: Insufficient documentation

## 2021-06-09 DIAGNOSIS — T7840XA Allergy, unspecified, initial encounter: Secondary | ICD-10-CM

## 2021-06-09 LAB — CBC WITH DIFFERENTIAL/PLATELET
Abs Immature Granulocytes: 0.03 10*3/uL (ref 0.00–0.07)
Basophils Absolute: 0.1 10*3/uL (ref 0.0–0.1)
Basophils Relative: 1 %
Eosinophils Absolute: 0.3 10*3/uL (ref 0.0–0.5)
Eosinophils Relative: 5 %
HCT: 52.5 % — ABNORMAL HIGH (ref 39.0–52.0)
Hemoglobin: 18.2 g/dL — ABNORMAL HIGH (ref 13.0–17.0)
Immature Granulocytes: 0 %
Lymphocytes Relative: 41 %
Lymphs Abs: 2.8 10*3/uL (ref 0.7–4.0)
MCH: 34.7 pg — ABNORMAL HIGH (ref 26.0–34.0)
MCHC: 34.7 g/dL (ref 30.0–36.0)
MCV: 100.2 fL — ABNORMAL HIGH (ref 80.0–100.0)
Monocytes Absolute: 0.8 10*3/uL (ref 0.1–1.0)
Monocytes Relative: 12 %
Neutro Abs: 2.8 10*3/uL (ref 1.7–7.7)
Neutrophils Relative %: 41 %
Platelets: 266 10*3/uL (ref 150–400)
RBC: 5.24 MIL/uL (ref 4.22–5.81)
RDW: 12.4 % (ref 11.5–15.5)
WBC: 6.9 10*3/uL (ref 4.0–10.5)
nRBC: 0 % (ref 0.0–0.2)

## 2021-06-09 LAB — BASIC METABOLIC PANEL
Anion gap: 12 (ref 5–15)
BUN: 12 mg/dL (ref 8–23)
CO2: 26 mmol/L (ref 22–32)
Calcium: 9.2 mg/dL (ref 8.9–10.3)
Chloride: 101 mmol/L (ref 98–111)
Creatinine, Ser: 0.99 mg/dL (ref 0.61–1.24)
GFR, Estimated: >60 mL/min (ref >60)
Glucose, Bld: 107 mg/dL — ABNORMAL HIGH (ref 70–99)
Potassium: 4.4 mmol/L (ref 3.5–5.1)
Sodium: 139 mmol/L (ref 135–145)

## 2021-06-09 LAB — TROPONIN I (HIGH SENSITIVITY)
Troponin I (High Sensitivity): 4 ng/L (ref <18)
Troponin I (High Sensitivity): 3 ng/L (ref <18)

## 2021-06-09 MED ORDER — FAMOTIDINE IN NACL 20-0.9 MG/50ML-% IV SOLN
20.0000 mg | Freq: Once | INTRAVENOUS | Status: AC
  Administered 2021-06-09: 20 mg via INTRAVENOUS
  Filled 2021-06-09: qty 50

## 2021-06-09 MED ORDER — DIPHENHYDRAMINE HCL 50 MG/ML IJ SOLN
50.0000 mg | Freq: Once | INTRAMUSCULAR | Status: AC
  Administered 2021-06-09: 50 mg via INTRAVENOUS
  Filled 2021-06-09: qty 1

## 2021-06-09 MED ORDER — METHYLPREDNISOLONE SODIUM SUCC 125 MG IJ SOLR
125.0000 mg | Freq: Once | INTRAMUSCULAR | Status: AC
  Administered 2021-06-09: 125 mg via INTRAVENOUS
  Filled 2021-06-09: qty 2

## 2021-06-09 MED ORDER — EPINEPHRINE 0.3 MG/0.3ML IJ SOAJ: 0.3000 mg | Freq: Once | INTRAMUSCULAR | Status: AC

## 2021-06-09 MED ORDER — EPINEPHRINE 0.3 MG/0.3ML IJ SOAJ
INTRAMUSCULAR | Status: AC
  Administered 2021-06-09: 0.3 mg via INTRAMUSCULAR
  Filled 2021-06-09: qty 0.3

## 2021-06-09 MED ORDER — EPINEPHRINE 0.3 MG/0.3ML IJ SOAJ: 0.3000 mg | INTRAMUSCULAR | 0 refills | Status: DC | PRN

## 2021-06-09 NOTE — ED Provider Notes (Signed)
[facility] Emergency Department Provider Note ____________________________________________   Event Date/Time   First MD Initiated Contact with Patient 06/09/21 [phone]     (approximate)  I have reviewed the triage vital signs and the nursing notes.   HISTORY  Chief Complaint Allergic Reaction    HPI Patrick Sawyer is a 80 y.o. male with PMH as noted below who presents with an apparent allergic reaction, cute onset over the last hour, occurring after he was stung by a bee on his right thumb at around 8 AM.  The patient denies any prior history of similar allergic reactions but states he has not been stung by bee in many years.  He initially noticed that he felt itchy all over, took off his shirt, and saw hives.  He states he has some chest discomfort and shortness of breath.  He feels like he is speaking differently than normal but denies any tightness in his throat or difficulty swallowing.   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   HLD (hyperlipidemia)    Obesity, Class I, BMI 30-34.9    Seasonal allergies    Traumatic enucleation of left eye     Patient Active Problem List   Diagnosis Date Noted   Varicose veins of both lower extremities without ulcer or inflammation 07/28/2020   Medicare annual wellness visit, subsequent 07/26/2019   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   Low serum vitamin B12 07/11/2018   Advanced care planning/counseling discussion 07/07/2016   Abdominal aortic ectasia (HCC)    Alcohol use    Traumatic enucleation  of left eye    HLD (hyperlipidemia)    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 Authorizing Provider  EPINEPHrine (EPIPEN 2-PAK) 0.3 mg/0.3 mL IJ SOAJ injection Inject 0.3 mg into the muscle as needed for anaphylaxis. 06/09/21  Yes Arta Silence, MD  aspirin 81 MG EC tablet Take 81 mg by mouth daily. Swallow whole.    [provider]  Cyanocobalamin (VITAMIN B-12 PO) Take 1,000 mcg by mouth daily.    [provider]  glucosamine-chondroitin 500-400 MG tablet Take 2 tablets by mouth daily.    [provider]  ketoconazole (NIZORAL) 2 % cream Apply to the feet and between the toes QHS. 12/24/20   Ralene Bathe, MD  mometasone (ELOCON) 0.1 % cream Apply BID to affected areas up to 2 weeks PRN 04/21/21   Brendolyn Patty, MD  Multiple Vitamins-Minerals (MULTIVITAMIN ADULT PO) Take 1 capsule by mouth daily.    [provider]  Omega-3 Fatty Acids (FISH OIL) 1000 MG CAPS Take 1 capsule by mouth daily.    [provider]    Allergies Bee venom  Family History  Problem Relation Age of Onset   Alcohol abuse Father    Alcohol abuse Brother    Colon cancer Brother 41   Arthritis Mother        knee   Cancer Brother        lung (smoker)   Cancer Brother        colon   Hypertension Brother    CAD Brother        CABG (smoker, EtOH)   Diabetes Neg Hx    Colon polyps Neg Hx    Esophageal cancer  Neg Hx    Stomach cancer Neg Hx    Rectal cancer Neg Hx     Social History Social History   Tobacco Use   Smoking status: Former    Types: Cigarettes    Quit date: 11/14/1989    Years since quitting: 31.5   Smokeless tobacco: Never  Vaping Use   Vaping Use: Never used  Substance Use Topics   Alcohol use: Yes    Alcohol/week: 28.0 standard drinks    Types: 28 Shots of liquor per week    Comment: bourbon   Drug use: No    Review of Systems  Constitutional: No fever/chills Eyes: No visual changes. ENT: No sore throat. Cardiovascular: Positive for chest discomfort. Respiratory: Positive for shortness of breath. Gastrointestinal: No vomiting or iarrhea.  Genitourinary: Negative for dysuria.  Musculoskeletal: Negative for back pain. Skin: Positive for rash. Neurological: Negative for headaches, focal weakness or numbness.   ____________________________________________   PHYSICAL EXAM:  VITAL SIGNS: ED Triage Vitals  Enc Vitals Group     BP 06/09/21 0930 (!) 152/84     Pulse Rate 06/09/21 0930 (!) 59     Resp 06/09/21 0930 14     Temp 06/09/21 0930 97.9 F (36.6 C)     Temp Source 06/09/21 0930 Oral     SpO2 06/09/21 0930 98 %     Weight 06/09/21 0931 230 lb (104.3 kg)     Height 06/09/21 0931 '6\' 2"'$  (1.88 m)     Head Circumference --      Peak Flow --      Pain Score 06/09/21 0931 7     Pain Loc --      Pain Edu? --      Excl. in [place]? --     Constitutional: Alert and oriented.  Relatively well appearing and in no acute distress. Eyes: Conjunctivae are normal.  Head: Atraumatic. Nose: No congestion/rhinnorhea. Mouth/Throat: Mucous membranes are moist.  Oropharynx clear with no swelling, stridor, or pooled secretions.  Clear voice. Neck: Normal range of motion.  Cardiovascular: Normal rate, regular rhythm. Grossly normal heart sounds.  Good peripheral circulation. Respiratory: Normal respiratory effort.  No retractions. Lungs CTAB. Gastrointestinal: No  distention.  Musculoskeletal: No lower extremity edema.  Extremities warm and well perfused.  Neurologic:  Normal speech and language. No gross focal neurologic deficits are appreciated.  Skin:  Skin is warm and dry.  Scattered areas of urticarial rash to torso and arms. Psychiatric: Mood and affect are normal. Speech and behavior are  normal.  ____________________________________________   LABS (all labs ordered are listed, but only abnormal results are displayed)  Labs Reviewed  BASIC METABOLIC PANEL - Abnormal; Notable for the following components:      Result Value   Glucose, Bld 107 (*)    All other components within normal limits  CBC WITH DIFFERENTIAL/PLATELET - Abnormal; Notable for the following components:   Hemoglobin 18.2 (*)    HCT 52.5 (*)    MCV 100.2 (*)    MCH 34.7 (*)    All other components within normal limits  TROPONIN I (HIGH SENSITIVITY)  TROPONIN I (HIGH SENSITIVITY)   ____________________________________________  EKG  ED ECG REPORT I, Arta Silence, the attending physician, personally viewed and interpreted this ECG.  Date: 06/09/2021 EKG Time: 0926 Rate: 61 Rhythm: normal sinus rhythm QRS Axis: normal Intervals: Prolonged PR ST/T Wave abnormalities: normal Narrative Interpretation: no evidence of acute ischemia  ____________________________________________  RADIOLOGY    ____________________________________________   PROCEDURES  Procedure(s) performed: No  Procedures  Critical Care performed: Yes  CRITICAL CARE Performed by: Arta Silence   Total critical care time: 30 minutes  Critical care time was exclusive of separately billable procedures and treating other patients.  Critical care was necessary to treat or prevent imminent or life-threatening deterioration.  Critical care was time spent personally by me on the following activities: development of treatment plan with patient and/or surrogate as well as nursing,  discussions with consultants, evaluation of patient's response to treatment, examination of patient, obtaining history from patient or surrogate, ordering and performing treatments and interventions, ordering and review of laboratory studies, ordering and review of radiographic studies, pulse oximetry and re-evaluation of patient's condition.  ____________________________________________   INITIAL IMPRESSION / ASSESSMENT AND PLAN / ED COURSE  Pertinent labs & imaging results that were available during my care of the patient were reviewed by me and considered in my medical decision making (see chart for details).   80 year old male with PMH as noted above and no known allergies presents with an apparent allergic reaction after being stung by a bee this morning.  The patient presents with diffuse hives, itching, chest discomfort and some shortness of breath.  On exam he is overall well-appearing.  His vital signs are normal except for mild hypertension.  The oropharynx is clear.  The lungs are clear to auscultation.  He has patchy areas of urticaria over his upper body.  Overall presentation is consistent with acute allergic reaction/mild anaphylaxis.  Given the presence of some respiratory symptoms we will give epinephrine as well as Benadryl, Pepcid, and Solu-Medrol.  I have a low suspicion for ACS but given the patient's age and the presence of chest discomfort we will obtain an EKG and cardiac enzymes.  ----------------------------------------- 12:53 PM on 06/09/2021 -----------------------------------------  The patient has now been in the ED for 3.5 hours.  His symptoms have completely resolved.  He has no chest discomfort.  Troponins are negative x2.  He has had no recurrence of the urticaria or itching.  He feels well and wants to go home.  He is stable for discharge at this time.  I counseled the patient on the results of the work-up.  I have prescribed an EpiPen for home.  I gave him  thorough return precautions and he expresses understanding.  ____________________________________________   FINAL CLINICAL IMPRESSION(S) / ED DIAGNOSES  Final diagnoses:  Allergic reaction, initial encounter  Atypical chest pain      NEW MEDICATIONS STARTED DURING THIS VISIT:  New Prescriptions   EPINEPHRINE (EPIPEN 2-PAK) 0.3 MG/0.3 ML IJ SOAJ INJECTION    Inject 0.3 mg into the muscle as needed for anaphylaxis.     Note:  This document was prepared using Dragon voice recognition software and may include unintentional dictation errors.    Arta Silence, MD 06/09/21 1254

## 2021-06-09 NOTE — ED Triage Notes (Signed)
PAtient presents with chest tightness/pain, whelps all over and swollen left hand after bee sting at 8am

## 2021-06-09 NOTE — ED Notes (Signed)
Report given to Anderson Malta, RN Anderson Malta, RN will now assume care of patient.

## 2021-06-14 ENCOUNTER — Encounter: Payer: Self-pay | Admitting: Family Medicine

## 2021-06-14 ENCOUNTER — Other Ambulatory Visit: Payer: Self-pay

## 2021-06-14 ENCOUNTER — Ambulatory Visit (INDEPENDENT_AMBULATORY_CARE_PROVIDER_SITE_OTHER): Payer: Medicare Other | Admitting: Family Medicine

## 2021-06-14 ENCOUNTER — Inpatient Hospital Stay: Payer: Medicare Other | Admitting: Family Medicine

## 2021-06-14 VITALS — BP 140/72 | HR 61 | Temp 97.6°F | Ht 74.0 in | Wt 237.2 lb

## 2021-06-14 DIAGNOSIS — R2242 Localized swelling, mass and lump, left lower limb: Secondary | ICD-10-CM | POA: Diagnosis not present

## 2021-06-14 DIAGNOSIS — T63481A Toxic effect of venom of other arthropod, accidental (unintentional), initial encounter: Secondary | ICD-10-CM

## 2021-06-14 NOTE — Assessment & Plan Note (Signed)
Lump to L superior calf - anticipate superficial thrombosis of calf vein. No unilateral swelling or redness, not consistent with DVT. rec supportive care with warm compresses and voltaren topically. Update if enlarging or changing for further evaluation.

## 2021-06-14 NOTE — Assessment & Plan Note (Addendum)
Reviewed ER visit with patient. He now has epi pen and is aware to avoid hymenoptera stings in the future - bees, wasps, yellow jackets, hornets, and imported fire ants.  Discussed option of referral to allergist to see if candidate for venom immunotherapy - he will consider.

## 2021-06-14 NOTE — Progress Notes (Signed)
Patient ID: Patrick Sawyer, male    DOB: June 18, 1941, 80 y.o.   MRN: [id number]  This visit was conducted in person.  BP 140/72   Pulse 61   Temp 97.6 F (36.4 C) (Temporal)   Ht '6\' 2"'$  (1.88 m)   Wt 237 lb 4 oz (107.6 kg)   SpO2 97%   BMI 30.46 kg/m    CC: ER f/u visit  Subjective:   HPI: Patrick Sawyer is a 80 y.o. male presenting on 06/14/2021 for Hospitalization Follow-up (Seen on 06/09/21 at [facility] ED, dx allergic reaction;  atypical chest pain. )   Recent ER visit for acute allergic mild anaphylactic reaction after insect sting to thumb - developed forearm swelling and itchy hives up right arm into chest and in groin, chest discomfort, but not dyspnea. Wife mentioned he was slurring speech. Treated with epinephrine, benadryl, pepcid and solumedrol. Discharged with epi pen. EKG was normal.   Did not see insect but assumes it was a wasp or hornet or yellow jacket  He thinks he has been stung in the past but this was first time he reacted to sting.   By the way - knot noted behind left calf, somewhat tender to touch.      Relevant past medical, surgical, family and social history reviewed and updated as indicated. Interim medical history since our last visit reviewed. Allergies and medications reviewed and updated. Outpatient Medications Prior to Visit  Medication Sig Dispense Refill   aspirin 81 MG EC tablet Take 81 mg by mouth daily. Swallow whole.     Cyanocobalamin (VITAMIN B-12 PO) Take 1,000 mcg by mouth daily.     glucosamine-chondroitin 500-400 MG tablet Take 2 tablets by mouth daily.     ketoconazole (NIZORAL) 2 % cream Apply to the feet and between the toes QHS. 60 g 6   Multiple Vitamins-Minerals (MULTIVITAMIN ADULT PO) Take 1 capsule by mouth daily.     Omega-3 Fatty Acids (FISH OIL) 1000 MG CAPS Take 1 capsule by mouth daily.     EPINEPHrine (EPIPEN 2-PAK) 0.3 mg/0.3 mL IJ SOAJ injection Inject 0.3 mg into the muscle as needed for anaphylaxis.  1 each 0   mometasone (ELOCON) 0.1 % cream Apply BID to affected areas up to 2 weeks PRN 50 g 2   No facility-administered medications prior to visit.     Per HPI unless specifically indicated in ROS section below Review of Systems  Objective:  BP 140/72   Pulse 61   Temp 97.6 F (36.4 C) (Temporal)   Ht '6\' 2"'$  (1.88 m)   Wt 237 lb 4 oz (107.6 kg)   SpO2 97%   BMI 30.46 kg/m   Wt Readings from Last 3 Encounters:  06/14/21 237 lb 4 oz (107.6 kg)  06/09/21 230 lb (104.3 kg)  07/28/20 235 lb 6 oz (106.8 kg)      Physical Exam Vitals and nursing note reviewed.  Constitutional:      Appearance: Normal appearance. He is not ill-appearing.  Cardiovascular:     Rate and Rhythm: Normal rate and regular rhythm.     Pulses: Normal pulses.     Heart sounds: Normal heart sounds. No murmur heard. Pulmonary:     Effort: Pulmonary effort is normal. No respiratory distress.     Breath sounds: Normal breath sounds. No wheezing, rhonchi or rales.  Musculoskeletal:        General: No swelling or tenderness.     Right lower leg:  No edema.     Left lower leg: No edema.     Comments:  Palpable cord to L superior calf, mildly tender without erythema or warmth  Diminished pedal pulses bilaterally  Skin:    General: Skin is warm and dry.     Findings: No erythema or rash.  Neurological:     Mental Status: He is alert.  Psychiatric:        Mood and Affect: Mood normal.        Behavior: Behavior normal.      Results for orders placed or performed during the hospital encounter of [id number]  Basic metabolic panel  Result Value Ref Range   Sodium 139 135 - 145 mmol/L   Potassium 4.4 3.5 - 5.1 mmol/L   Chloride 101 98 - 111 mmol/L   CO2 26 22 - 32 mmol/L   Glucose, Bld 107 (H) 70 - 99 mg/dL   BUN 12 8 - 23 mg/dL   Creatinine, Ser 0.99 0.61 - 1.24 mg/dL   Calcium 9.2 8.9 - 10.3 mg/dL   GFR, Estimated >60 >60 mL/min   Anion gap 12 5 - 15  CBC with Differential  Result Value Ref Range    WBC 6.9 4.0 - 10.5 K/uL   RBC 5.24 4.22 - 5.81 MIL/uL   Hemoglobin 18.2 (H) 13.0 - 17.0 g/dL   HCT 52.5 (H) 39.0 - 52.0 %   MCV 100.2 (H) 80.0 - 100.0 fL   MCH 34.7 (H) 26.0 - 34.0 pg   MCHC 34.7 30.0 - 36.0 g/dL   RDW 12.4 11.5 - 15.5 %   Platelets 266 150 - 400 K/uL   nRBC 0.0 0.0 - 0.2 %   Neutrophils Relative % 41 %   Neutro Abs 2.8 1.7 - 7.7 K/uL   Lymphocytes Relative 41 %   Lymphs Abs 2.8 0.7 - 4.0 K/uL   Monocytes Relative 12 %   Monocytes Absolute 0.8 0.1 - 1.0 K/uL   Eosinophils Relative 5 %   Eosinophils Absolute 0.3 0.0 - 0.5 K/uL   Basophils Relative 1 %   Basophils Absolute 0.1 0.0 - 0.1 K/uL   Immature Granulocytes 0 %   Abs Immature Granulocytes 0.03 0.00 - 0.07 K/uL  Troponin I (High Sensitivity)  Result Value Ref Range   Troponin I (High Sensitivity) 4 <18 ng/L  Troponin I (High Sensitivity)  Result Value Ref Range   Troponin I (High Sensitivity) 3 <18 ng/L    Assessment & Plan:  This visit occurred during the SARS-CoV-2 public health emergency.  Safety protocols were in place, including screening questions prior to the visit, additional usage of staff PPE, and extensive cleaning of exam room while observing appropriate contact time as indicated for disinfecting solutions.   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 He now has epi pen and is aware to avoid hymenoptera stings in the future -  bees, wasps, yellow jackets, hornets, and imported fire ants.  Discussed option of referral to allergist to see if candidate for venom immunotherapy - he will consider.       Skin lump of leg, left    Lump to L superior calf - anticipate superficial thrombosis of calf vein. No unilateral swelling or redness, not consistent with DVT. rec supportive care with warm compresses and voltaren topically. Update if enlarging or changing for further evaluation.  No orders of the  defined types were placed in this encounter.  No orders of the defined types were placed in this encounter.   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  Schedule wellness visit after 07/28/2021 For swelling to left calf - possible superficial vein inflammation - treat with warm compresses and over the counter topical voltaren gel to affected area 2-3 times a day. Let us know if enlarging or more painful or any other  change.   Follow up plan: Return if symptoms worsen or fail to improve, for medicare wellness visit.  Ria Bush, MD

## 2021-06-14 NOTE — Patient Instructions (Addendum)
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  Schedule wellness visit after 07/28/2021 For swelling to left calf - possible superficial vein inflammation - treat with warm compresses and over the counter topical voltaren gel to affected area 2-3 times a day. Let us know if enlarging or more painful or any other  change.   Bee, Wasp, or [employer], Adult Bees, wasps, and hornets are part of a family of insects that can sting people. These stings can cause pain and inflammation, but they are usually not serious. However, some people may have an allergic reaction to a sting. This can causethe symptoms to be more severe. What increases the risk? You may be at a greater risk of getting stung if you: Provoke a stinging insect by swatting or disturbing it. Wear strong-smelling soaps, deodorants, or body sprays. Spend time outdoors near gardens with flowers or fruit trees or in clothes that expose skin. Eat or drink outside. What are the signs or symptoms? Common symptoms of this condition include: A red lump in the skin that sometimes has a tiny hole in the center. In some cases, a stinger may be in the center of the wound. Pain and itching at the sting site. Redness and swelling around the sting site. If you have an allergic reaction (localized allergic reaction), the swelling and redness may spread out from the sting site. In some cases, this reaction can continue to develop over the next 24-48 hours. In rare cases, a person may have a severe allergic reaction (anaphylactic reaction) to a sting. Symptoms of an anaphylactic reaction may include: Wheezing or difficulty breathing. Raised, itchy, red patches on the skin (hives). Nausea or vomiting. Abdominal cramping. Diarrhea. Tightness in the chest or chest pain. Dizziness or fainting. Redness of the  face (flushing). Hoarse voice. Swollen tongue, lips, or face. How is this diagnosed? This condition is usually diagnosed based on your symptoms and medical history as well as a physical exam. You may have an allergy test to determine if you are allergic to the substance that the insect injected during the sting (venom). How is this treated? If you were stung by a bee, the stinger and a small sac of venom may be in the wound. It is important to remove the stinger as soon as possible. You can do this by brushing across the wound with gauze, a fingernail, or a flat card such as a credit card. Removing the stinger can help reduce the severity of yourbody's reaction to the sting. Most stings can be treated with: Icing to reduce swelling in the area. Medicines (antihistamines) to treat itching or an allergic reaction. Medicines to help reduce pain. These may be medicines that you take by mouth, or medicated creams or lotions that you apply to your skin. Pay close attention to your symptoms after you have been stung. If possible, have someone stay with you to make sure you do not have an allergic reaction. If you have any signs of an allergic reaction, call your health care provider. If you have ever had a severe allergic reaction, your health care provider may give you an inhaler or injectable medicine (epinephrine auto-injector) to use if necessary. Follow these instructions at home:  Wash the sting site 2-3 times each day with soap and water as told by your health care provider. Apply or take over-the-counter and prescription medicines  only as told by your health care provider. If directed, apply ice to the sting area. Put ice in a plastic bag. Place a towel between your skin and the bag. Leave the ice on for 20 minutes, 2-3 times a day. Do not scratch the sting area. If you had a severe allergic reaction to a sting, you may need: To wear a medical bracelet or necklace that lists the allergy. To  learn when and how to use an anaphylaxis kit or epinephrine injection. Your family members and coworkers may also need to learn this. To carry an anaphylaxis kit or epinephrine injection with you at all times. How is this prevented? Avoid swatting at stinging insects and disturbing insect nests. Do not use fragrant soaps or lotions. Wear shoes, pants, and long sleeves when spending time outdoors, especially in grassy areas where stinging insects are common. Keep outdoor areas free from nests or hives. Keep food and drink containers covered when eating outdoors. Avoid working or sitting near [employer], if possible. Wear gloves if you are gardening or working outdoors. If an attack by a stinging insect or a swarm seems likely in the moment, move away from the area or find a barrier between you and the insect(s), such as a door. Contact a health care provider if: Your symptoms do not get better in 2-3 days. You have redness, swelling, or pain that spreads beyond the area of the sting. You have a fever. Get help right away if: You have symptoms of a severe allergic reaction. These include: Wheezing or difficulty breathing. Tightness in the chest or chest pain. Light-headedness or fainting. Itchy, raised, red patches on the skin. Nausea or vomiting. Abdominal cramping. Diarrhea. A swollen tongue or lips, or trouble swallowing. Dizziness or fainting. Summary Stings from bees, wasps, and hornets can cause pain and inflammation, but they are usually not serious. However, some people may have an allergic reaction to a sting. This can cause the symptoms to be more severe. Pay close attention to your symptoms after you have been stung. If possible, have someone stay with you to make sure you do not have an allergic reaction. Call your health care provider if you have any signs of an allergic reaction. This information is not intended to replace advice given to you by your health care  provider. Make sure you discuss any questions you have with your healthcare provider. Document Revised: 08/25/2020 Document Reviewed: 08/25/2020 Elsevier Patient Education  2022 [employer].

## 2021-08-10 DIAGNOSIS — Z23 Encounter for immunization: Secondary | ICD-10-CM | POA: Diagnosis not present

## 2021-08-30 ENCOUNTER — Ambulatory Visit (INDEPENDENT_AMBULATORY_CARE_PROVIDER_SITE_OTHER): Payer: Medicare Other | Admitting: Dermatology

## 2021-08-30 ENCOUNTER — Other Ambulatory Visit: Payer: Self-pay

## 2021-08-30 DIAGNOSIS — L570 Actinic keratosis: Secondary | ICD-10-CM | POA: Diagnosis not present

## 2021-08-30 DIAGNOSIS — L578 Other skin changes due to chronic exposure to nonionizing radiation: Secondary | ICD-10-CM | POA: Diagnosis not present

## 2021-08-30 DIAGNOSIS — L820 Inflamed seborrheic keratosis: Secondary | ICD-10-CM | POA: Diagnosis not present

## 2021-08-30 DIAGNOSIS — L821 Other seborrheic keratosis: Secondary | ICD-10-CM

## 2021-08-30 DIAGNOSIS — L219 Seborrheic dermatitis, unspecified: Secondary | ICD-10-CM

## 2021-08-30 NOTE — Patient Instructions (Signed)

## 2021-08-30 NOTE — Progress Notes (Signed)
   Follow-Up Visit   Subjective  Patrick Sawyer is a 80 y.o. male who presents for the following: irregular skin lesions (On the R post auricular, nose, and neck that he would like checked today.).  The following portions of the chart were reviewed this encounter and updated as appropriate:   Tobacco  Allergies  Meds  Problems  Med Hx  Surg Hx  Fam Hx     Review of Systems:  No other skin or systemic complaints except as noted in HPI or Assessment and Plan.  Objective  Well appearing patient in no apparent distress; mood and affect are within normal limits.  A focused examination was performed including the face and neck. Relevant physical exam findings are noted in the Assessment and Plan.  R post auricular x 1, R neck x 1 (2) Erythematous keratotic or waxy stuck-on papule or plaque.   Nasal tip x 1 Erythematous thin papules/macules with gritty scale.   Face Pink patches with greasy scale.    Assessment & Plan  Inflamed seborrheic keratosis R post auricular x 1, R neck x 1  Destruction of lesion - R post auricular x 1, R neck x 1 Complexity: simple   Destruction method: cryotherapy   Informed consent: discussed and consent obtained   Timeout:  patient name, date of birth, surgical site, and procedure verified Lesion destroyed using liquid nitrogen: Yes   Region frozen until ice ball extended beyond lesion: Yes   Outcome: patient tolerated procedure well with no complications   Post-procedure details: wound care instructions given    AK (actinic keratosis) Nasal tip x 1  Destruction of lesion - Nasal tip x 1 Complexity: simple   Destruction method: cryotherapy   Informed consent: discussed and consent obtained   Timeout:  patient name, date of birth, surgical site, and procedure verified Lesion destroyed using liquid nitrogen: Yes   Region frozen until ice ball extended beyond lesion: Yes   Outcome: patient tolerated procedure well with no  complications   Post-procedure details: wound care instructions given    Seborrheic dermatitis Face  Seborrheic Dermatitis  -  is a chronic persistent rash characterized by pinkness and scaling most commonly of the mid face but also can occur on the scalp (dandruff), ears; mid chest, mid back and groin.  It tends to be exacerbated by stress and cooler weather.  People who have neurologic disease may experience new onset or exacerbation of existing seborrheic dermatitis.  The condition is not curable but treatable and can be controlled.   Start Ketoconazole 2% cream QHS (patient has already for the feet).   Seborrheic Keratoses - Stuck-on, waxy, tan-brown papules and/or plaques  - Benign-appearing - Discussed benign etiology and prognosis. - Observe - Call for any changes  Actinic Damage - chronic, secondary to cumulative UV radiation exposure/sun exposure over time - diffuse scaly erythematous macules with underlying dyspigmentation - Recommend daily broad spectrum sunscreen SPF 30+ to sun-exposed areas, reapply every 2 hours as needed.  - Recommend staying in the shade or wearing long sleeves, sun glasses (UVA+UVB protection) and wide brim hats (4-inch brim around the entire circumference of the hat). - Call for new or changing lesions.  Return for appointment as scheduled.  Luther Redo, CMA, am acting as scribe for Sarina Ser, MD . Documentation: I have reviewed the above documentation for accuracy and completeness, and I agree with the above.  Sarina Ser, MD

## 2021-08-31 ENCOUNTER — Encounter: Payer: Self-pay | Admitting: Dermatology

## 2021-09-06 ENCOUNTER — Other Ambulatory Visit: Payer: Self-pay | Admitting: Family Medicine

## 2021-09-06 DIAGNOSIS — E785 Hyperlipidemia, unspecified: Secondary | ICD-10-CM

## 2021-09-06 DIAGNOSIS — E538 Deficiency of other specified B group vitamins: Secondary | ICD-10-CM

## 2021-09-06 DIAGNOSIS — Z1159 Encounter for screening for other viral diseases: Secondary | ICD-10-CM

## 2021-09-06 DIAGNOSIS — D751 Secondary polycythemia: Secondary | ICD-10-CM

## 2021-09-07 ENCOUNTER — Ambulatory Visit (INDEPENDENT_AMBULATORY_CARE_PROVIDER_SITE_OTHER): Payer: Medicare Other

## 2021-09-07 VITALS — Ht 74.0 in | Wt 230.0 lb

## 2021-09-07 DIAGNOSIS — Z0000 Encounter for general adult medical examination without abnormal findings: Secondary | ICD-10-CM

## 2021-09-07 NOTE — Patient Instructions (Addendum)
Patrick Sawyer , Thank you for taking time to complete your Medicare Wellness Visit. I appreciate your ongoing commitment to your health goals. Please review the following plan we discussed and let me know if I can assist you in the future.   Screening recommendations/referrals: Colonoscopy: No longer required  Recommended yearly ophthalmology/optometry visit for glaucoma screening and checkup Recommended yearly dental visit for hygiene and checkup  Vaccinations: Influenza vaccine: Up to date, completed 07/2021, please bring vaccine documentation to your next appointment.  Pneumococcal vaccine: Up to date Tdap vaccine: Up to date, completed 11/15/11, Due 11/14/2021  Shingles vaccine: Shingrix-1st dose given 03/2021,  please bring vaccine documentation to your next appointment.  Covid-19: newest booster completed 04/2021, please bring vaccine documentation to your next appointment.   Advanced directives: Please bring a copy of Living Will and/or Healthcare Power of Attorney for your chart.   Conditions/risks identified: See Problem List  Next appointment: Follow up in one year for your annual wellness visit. 09/09/22 @ 9:00am , this will be a telephone visit.  Preventive Care 86 Years and Older, Male Preventive care refers to lifestyle choices and visits with your health care provider that can promote health and wellness. What does preventive care include? A yearly physical exam. This is also called an annual well check. Dental exams once or twice a year. Routine eye exams. Ask your health care provider how often you should have your eyes checked. Personal lifestyle choices, including: Daily care of your teeth and gums. Regular physical activity. Eating a healthy diet. Avoiding tobacco and drug use. Limiting alcohol use. Practicing safe sex. Taking low doses of aspirin every day. Taking vitamin and mineral supplements as recommended by your health care provider. What happens during an  annual well check? The services and screenings done by your health care provider during your annual well check will depend on your age, overall health, lifestyle risk factors, and family history of disease. Counseling  Your health care provider may ask you questions about your: Alcohol use. Tobacco use. Drug use. Emotional well-being. Home and relationship well-being. Sexual activity. Eating habits. History of falls. Memory and ability to understand (cognition). Work and work Statistician. Screening  You may have the following tests or measurements: Height, weight, and BMI. Blood pressure. Lipid and cholesterol levels. These may be checked every 5 years, or more frequently if you are over 51 years old. Skin check. Lung cancer screening. You may have this screening every year starting at age 78 if you have a 30-pack-year history of smoking and currently smoke or have quit within the past 15 years. Fecal occult blood test (FOBT) of the stool. You may have this test every year starting at age 70. Flexible sigmoidoscopy or colonoscopy. You may have a sigmoidoscopy every 5 years or a colonoscopy every 10 years starting at age 59. Prostate cancer screening. Recommendations will vary depending on your family history and other risks. Hepatitis C blood test. Hepatitis B blood test. Sexually transmitted disease (STD) testing. Diabetes screening. This is done by checking your blood sugar (glucose) after you have not eaten for a while (fasting). You may have this done every 1-3 years. Abdominal aortic aneurysm (AAA) screening. You may need this if you are a current or former smoker. Osteoporosis. You may be screened starting at age 81 if you are at high risk. Talk with your health care provider about your test results, treatment options, and if necessary, the need for more tests. Vaccines  Your health care provider may  recommend certain vaccines, such as: Influenza vaccine. This is recommended  every year. Tetanus, diphtheria, and acellular pertussis (Tdap, Td) vaccine. You may need a Td booster every 10 years. Zoster vaccine. You may need this after age 81. Pneumococcal 13-valent conjugate (PCV13) vaccine. One dose is recommended after age 58. Pneumococcal polysaccharide (PPSV23) vaccine. One dose is recommended after age 42. Talk to your health care provider about which screenings and vaccines you need and how often you need them. This information is not intended to replace advice given to you by your health care provider. Make sure you discuss any questions you have with your health care provider. Document Released: 11/27/2015 Document Revised: 07/20/2016 Document Reviewed: 09/01/2015 Elsevier Interactive Patient Education  2017 [place] Prevention in the Home Falls can cause injuries. They can happen to people of all ages. There are many things you can do to make your home safe and to help prevent falls. What can I do on the outside of my home? Regularly fix the edges of walkways and driveways and fix any cracks. Remove anything that might make you trip as you walk through a door, such as a raised step or threshold. Trim any bushes or trees on the path to your home. Use bright outdoor lighting. Clear any walking paths of anything that might make someone trip, such as rocks or tools. Regularly check to see if handrails are loose or broken. Make sure that both sides of any steps have handrails. Any raised decks and porches should have guardrails on the edges. Have any leaves, snow, or ice cleared regularly. Use sand or salt on walking paths during winter. Clean up any spills in your garage right away. This includes oil or grease spills. What can I do in the bathroom? Use night lights. Install grab bars by the toilet and in the tub and shower. Do not use towel bars as grab bars. Use non-skid mats or decals in the tub or shower. If you need to sit down in the shower,  use a plastic, non-slip stool. Keep the floor dry. Clean up any water that spills on the floor as soon as it happens. Remove soap buildup in the tub or shower regularly. Attach bath mats securely with double-sided non-slip rug tape. Do not have throw rugs and other things on the floor that can make you trip. What can I do in the bedroom? Use night lights. Make sure that you have a light by your bed that is easy to reach. Do not use any sheets or blankets that are too big for your bed. They should not hang down onto the floor. Have a firm chair that has side arms. You can use this for support while you get dressed. Do not have throw rugs and other things on the floor that can make you trip. What can I do in the kitchen? Clean up any spills right away. Avoid walking on wet floors. Keep items that you use a lot in easy-to-reach places. If you need to reach something above you, use a strong step stool that has a grab bar. Keep electrical cords out of the way. Do not use floor polish or wax that makes floors slippery. If you must use wax, use non-skid floor wax. Do not have throw rugs and other things on the floor that can make you trip. What can I do with my stairs? Do not leave any items on the stairs. Make sure that there are handrails on both sides of  the stairs and use them. Fix handrails that are broken or loose. Make sure that handrails are as long as the stairways. Check any carpeting to make sure that it is firmly attached to the stairs. Fix any carpet that is loose or worn. Avoid having throw rugs at the top or bottom of the stairs. If you do have throw rugs, attach them to the floor with carpet tape. Make sure that you have a light switch at the top of the stairs and the bottom of the stairs. If you do not have them, ask someone to add them for you. What else can I do to help prevent falls? Wear shoes that: Do not have high heels. Have rubber bottoms. Are comfortable and fit you  well. Are closed at the toe. Do not wear sandals. If you use a stepladder: Make sure that it is fully opened. Do not climb a closed stepladder. Make sure that both sides of the stepladder are locked into place. Ask someone to hold it for you, if possible. Clearly mark and make sure that you can see: Any grab bars or handrails. First and last steps. Where the edge of each step is. Use tools that help you move around (mobility aids) if they are needed. These include: Canes. Walkers. Scooters. Crutches. Turn on the lights when you go into a dark area. Replace any light bulbs as soon as they burn out. Set up your furniture so you have a clear path. Avoid moving your furniture around. If any of your floors are uneven, fix them. If there are any pets around you, be aware of where they are. Review your medicines with your doctor. Some medicines can make you feel dizzy. This can increase your chance of falling. Ask your doctor what other things that you can do to help prevent falls. This information is not intended to replace advice given to you by your health care provider. Make sure you discuss any questions you have with your health care provider. Document Released: 08/27/2009 Document Revised: 04/07/2016 Document Reviewed: 12/05/2014 Elsevier Interactive Patient Education  2017 [employer].

## 2021-09-07 NOTE — Progress Notes (Signed)
Subjective:   Patrick Sawyer is a 80 y.o. male who presents for Medicare Annual/Subsequent preventive examination.  I connected with Myrtice Lauth today by telephone and verified that I am speaking with the correct person using two identifiers. Location patient: home Location provider: work Persons participating in the virtual visit: patient, Marine scientist.    I discussed the limitations, risks, security and privacy concerns of performing an evaluation and management service by telephone and the availability of in person appointments. I also discussed with the patient that there may be a patient responsible charge related to this service. The patient expressed understanding and verbally consented to this telephonic visit.    Interactive audio and video telecommunications were attempted between this provider and patient, however failed, due to patient having technical difficulties OR patient did not have access to video capability.  We continued and completed visit with audio only.  Some vital signs may be absent or patient reported.   Time Spent with patient on telephone encounter: 30 minutes   Review of Systems     Cardiac Risk Factors include: advanced age (>48men, >18 women);dyslipidemia     Objective:    Today's Vitals   09/07/21 0903  Weight: 230 lb (104.3 kg)  Height: 6\' 2"  (1.88 m)   Body mass index is 29.53 kg/m.  Advanced Directives 09/07/2021 06/09/2021 07/12/2018 07/10/2017 06/29/2016  Does Patient Have a Medical Advance Directive? Yes No Yes Yes Yes  Type of Paramedic of [place];Living will - [place];Living will [place];Living will [place];Living will  Does patient want to make changes to medical advance directive? Yes (MAU/Ambulatory/Procedural Areas - Information given) - - - -  Copy of [place] in Chart? No - copy requested - No - copy requested No -  copy requested -  Would patient like information on creating a medical advance directive? - No - Patient declined - - -    Current Medications (verified) Outpatient Encounter Medications as of 09/07/2021  Medication Sig   aspirin 81 MG EC tablet Take 81 mg by mouth daily. Swallow whole.   Cyanocobalamin (VITAMIN B-12 PO) Take 1,000 mcg by mouth daily.   glucosamine-chondroitin 500-400 MG tablet Take 2 tablets by mouth daily.   ketoconazole (NIZORAL) 2 % cream Apply to the feet and between the toes QHS.   Multiple Vitamins-Minerals (MULTIVITAMIN ADULT PO) Take 1 capsule by mouth daily.   Omega-3 Fatty Acids (FISH OIL) 1000 MG CAPS Take 1 capsule by mouth daily.   No facility-administered encounter medications on file as of 09/07/2021.    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   HLD (hyperlipidemia)    Obesity, Class I, BMI 30-34.9    Seasonal allergies    Traumatic enucleation of left eye    Past Surgical History:  Procedure Laterality Date   APPENDECTOMY  1959   COLONOSCOPY  01/2015  small polyps, rpt 5 yrs Tiffany Kocher)   COLONOSCOPY  06/2012   small polyps Vira Agar)   COLONOSCOPY  02/2020   1 HP, diverticulosis, int hem (Pyrtle)   ENUCLEATION Left 1969   HEMORRHOID BANDING  04/2015   Dr Tamala Julian   INGUINAL HERNIA REPAIR Right    MENISCUS REPAIR Left 2005   [place] SURGERY  04/2014   VARICOSE VEIN SURGERY  2006   Family History  Problem Relation Age of Onset   Alcohol abuse Father    Alcohol abuse  Brother    Colon cancer Brother 64   Arthritis Mother        knee   Cancer Brother        lung (smoker)   Cancer Brother        colon   Hypertension Brother    CAD Brother        CABG (smoker, EtOH)   Diabetes Neg Hx    Colon polyps Neg Hx    Esophageal cancer Neg Hx    Stomach cancer Neg Hx    Rectal cancer Neg Hx    Social History   Socioeconomic History   Marital status: Married    Spouse name: Not on file   Number of children: Not on file   Years of education: Not on file   Highest education level: Not on file  Occupational History   Not on file  Tobacco Use   Smoking status: Former    Types: Cigarettes    Quit date: 11/14/1989    Years since quitting: 31.8   Smokeless tobacco: Never  Vaping Use   Vaping Use: Never used  Substance and Sexual Activity   Alcohol use: Yes    Alcohol/week: 28.0 standard drinks    Types: 28 Shots of liquor per week    Comment: bourbon   Drug use: No   Sexual activity: Never  Other Topics Concern   Not on file  Social History Narrative   Lives with wife   Occupation: retired, was Optometrist   Edu: BS accounting   Activity: gym 3d/wk, stays busy with housework, traveling   Diet: good water, fruits/vegetables daily   Social Determinants of Radio broadcast assistant Strain: Low Risk    Difficulty of Paying Living Expenses: Not hard at all  Food Insecurity: No Food Insecurity   Worried About Charity fundraiser in the Last Year: Never true   Arboriculturist in the Last Year: Never true  Transportation Needs: No Transportation Needs   Lack of Transportation (Medical): No   Lack of Transportation (Non-Medical): No  Physical Activity: Sufficiently Active   Days of Exercise per Week: 3 days   Minutes of Exercise per Session: 90 min  Stress: No Stress Concern Present   Feeling of Stress : Not at all  Social Connections: Moderately Isolated   Frequency of Communication with Friends and Family: Once a week   Frequency of Social  Gatherings with Friends and Family: Once a week   Attends Religious Services: Never   Marine scientist or Organizations: Yes   Attends Music therapist: More than 4 times per year   Marital Status: Married    Tobacco Counseling Counseling given: Not Answered   Clinical Intake:  Pre-visit preparation completed: Yes  Pain : No/denies pain     BMI - recorded: 29.53 Nutritional Risks: None Diabetes: No  How often do you need to have someone help you when you read instructions,  pamphlets, or other written materials from your doctor or pharmacy?: 1 - Never  Diabetic?No  Interpreter Needed?: No  Information entered by :: Orrin Brigham LPN   Activities of Daily Living In your present state of health, do you have any difficulty performing the following activities: 09/07/2021  Hearing? Y  Comment Wears hearing aid in left ear.  Vision? N  Difficulty concentrating or making decisions? N  Walking or climbing stairs? N  Dressing or bathing? N  Doing errands, shopping? N  Preparing Food and eating ? N  Using the Toilet? N  Do you have problems with loss of bowel control? N  Managing your Medications? N  Housekeeping or managing your Housekeeping? N  Some recent data might be hidden    Patient Care Team: Ria Bush, MD as PCP - General (Family Medicine) Ralene Bathe, MD as Consulting Physician (Dermatology) Karren Burly Deirdre Peer, MD as Referring Physician (Ophthalmology) Manya Silvas, MD (Inactive) as Consulting Physician (Gastroenterology)  Indicate any recent Medical Services you may have received from other than [facility] providers in the past year (date may be approximate).     Assessment:   This is a routine wellness examination for Cheng.  Hearing/Vision screen Hearing Screening - Comments:: Patient wears hearing aid in left ear. Last screening done in February.  Vision Screening - Comments:: Last eye exam 01/2021, Dr.  Wallace Going. Patient wears glasses.   Dietary issues and exercise activities discussed: Current Exercise Habits: Structured exercise class, Type of exercise: strength training/weights;treadmill;stretching, Time (Minutes): > 60, Frequency (Times/Week): 3, Weekly Exercise (Minutes/Week): 0, Intensity: Moderate   Goals Addressed             This Visit's Progress    Patient Stated       Would like to increase exercise routine.       Depression Screen PHQ 2/9 Scores 09/07/2021 07/28/2020 07/26/2019 07/12/2018 07/10/2017 06/29/2016  PHQ - 2 Score 0 0 0 0 0 0  PHQ- 9 Score - - - 0 1 -    Fall Risk Fall Risk  09/07/2021 07/28/2020 07/26/2019 07/12/2018 07/14/2017  Falls in the past year? 0 0 0 No No  Number falls in past yr: 0 - - - -  Injury with Fall? 0 - - - -  Risk for fall due to : No Fall Risks - - - -  Follow up Falls prevention discussed - - - -    FALL RISK PREVENTION PERTAINING TO THE HOME:  Any stairs in or around the home? Yes  If so, are there any without handrails? No  Home free of loose throw rugs in walkways, pet beds, electrical cords, etc? Yes  Adequate lighting in your home to reduce risk of falls? Yes   ASSISTIVE DEVICES UTILIZED TO PREVENT FALLS:  Life alert? No  Use of a cane, walker or w/c? No  Grab bars in the bathroom? No  Shower chair or bench in shower? No  Elevated toilet seat or a handicapped toilet? No   TIMED UP AND GO:  Was the test performed? No .  Visit completed over the phone.   Cognitive Function: Normal cognitive status assessed  by this Nurse Health Advisor. No abnormalities found.   MMSE - Mini Mental State Exam 07/12/2018 07/10/2017 06/29/2016  Orientation to time 5 5 5   Orientation to Place 5 5 5   Registration 3 3 3   Attention/ Calculation 0 0 0  Recall 3 3 3   Language- name 2 objects 0 0 0  Language- repeat 1 1 1   Language- follow 3 step command 3 3 3   Language- read & follow direction 0 0 0  Write a sentence 0 0 0  Copy design 0  0 0  Total score 20 20 20         Immunizations Immunization History  Administered Date(s) Administered   Fluad Quad(high Dose 65+) 07/26/2019   Influenza, High Dose Seasonal PF 08/04/2016, 07/13/2020   Influenza-Unspecified 07/15/2018   PFIZER(Purple Top)SARS-COV-2 Vaccination 12/21/2019, 01/15/2020, 09/12/2020   Pneumococcal Conjugate-13 03/16/2015   Pneumococcal Polysaccharide-23 02/01/2010, 06/29/2016   Td 07/27/2008   Zoster, Live 07/27/2006    TDAP status: Up to date  Flu Vaccine status: Up to date per patient. Patient plans to bring vaccine information to next appointment.  Pneumococcal vaccine status: Up to date  Covid-19 vaccine status: Completed vaccines per patient.  Patient plans to bring vaccine information to next appointment.   Qualifies for Shingles Vaccine? Yes   Zostavax completed Yes   Shingrix Completed per patient. Patient states 1st dose was given 03/2021. Patient plans to bring vaccine information to next appointment.  Screening Tests Health Maintenance  Topic Date Due   Hepatitis C Screening  Never done   Zoster Vaccines- Shingrix (1 of 2) Never done   COVID-19 Vaccine (4 - Booster for Pfizer series) 11/07/2020   INFLUENZA VACCINE  06/14/2021   TETANUS/TDAP  11/14/2021   Pneumonia Vaccine 15+ Years old  Completed   HPV VACCINES  Aged Out    Health Maintenance  Health Maintenance Due  Topic Date Due   Hepatitis C Screening  Never done   Zoster Vaccines- Shingrix (1 of 2) Never done   COVID-19 Vaccine (4 - Booster for Pfizer series) 11/07/2020   INFLUENZA VACCINE  06/14/2021    Colorectal cancer screening: No longer required.   Lung Cancer Screening: (Low Dose CT Chest recommended if Age 65-80 years, 30 pack-year currently smoking OR have quit w/in 15years.) does not qualify.     Additional Screening:  Hepatitis C Screening: does not qualify  Vision Screening: Recommended annual ophthalmology exams for early detection of glaucoma and  other disorders of the eye. Is the patient up to date with their annual eye exam?  Yes  Who is the provider or what is the name of the office in which the patient attends annual eye exams? Dr. Wallace Going  Dental Screening: Recommended annual dental exams for proper oral hygiene  Community Resource Referral / Chronic Care Management: CRR required this visit?  No   CCM required this visit?  No      Plan:     I have personally reviewed and noted the following in the patient's chart:   Medical and social history Use of alcohol, tobacco or illicit drugs  Current medications and supplements including opioid prescriptions. Patient is not currently taking opioid prescriptions. Functional ability and status Nutritional status Physical activity Advanced directives List of other physicians Hospitalizations, surgeries, and ER visits in previous 12 months Vitals Screenings to include cognitive, depression, and falls Referrals and appointments  In addition, I have reviewed and discussed with patient certain preventive protocols, quality metrics, and best practice recommendations. A written personalized care plan for preventive services as well as general preventive health recommendations were provided to patient.   Due to this being a telephonic visit, the after visit summary with patients personalized plan was offered to patient via mail or my-chart. Patient would like to access on my-chart.   Loma Messing, LPN  09/07/2021  Nurse Health Advisor  Nurse Notes: None

## 2021-09-08 ENCOUNTER — Other Ambulatory Visit: Payer: Medicare Other

## 2021-09-15 ENCOUNTER — Ambulatory Visit (INDEPENDENT_AMBULATORY_CARE_PROVIDER_SITE_OTHER): Payer: Medicare Other | Admitting: Family Medicine

## 2021-09-15 ENCOUNTER — Encounter: Payer: Self-pay | Admitting: Family Medicine

## 2021-09-15 ENCOUNTER — Other Ambulatory Visit: Payer: Self-pay

## 2021-09-15 VITALS — BP 140/82 | HR 61 | Temp 97.7°F | Ht 72.0 in | Wt 232.1 lb

## 2021-09-15 DIAGNOSIS — Z7189 Other specified counseling: Secondary | ICD-10-CM | POA: Diagnosis not present

## 2021-09-15 DIAGNOSIS — I77811 Abdominal aortic ectasia: Secondary | ICD-10-CM

## 2021-09-15 DIAGNOSIS — T63481S Toxic effect of venom of other arthropod, accidental (unintentional), sequela: Secondary | ICD-10-CM

## 2021-09-15 DIAGNOSIS — S0572XS Avulsion of left eye, sequela: Secondary | ICD-10-CM | POA: Diagnosis not present

## 2021-09-15 DIAGNOSIS — E785 Hyperlipidemia, unspecified: Secondary | ICD-10-CM

## 2021-09-15 DIAGNOSIS — D751 Secondary polycythemia: Secondary | ICD-10-CM | POA: Diagnosis not present

## 2021-09-15 DIAGNOSIS — E538 Deficiency of other specified B group vitamins: Secondary | ICD-10-CM

## 2021-09-15 DIAGNOSIS — T782XXS Anaphylactic shock, unspecified, sequela: Secondary | ICD-10-CM

## 2021-09-15 DIAGNOSIS — Z789 Other specified health status: Secondary | ICD-10-CM | POA: Diagnosis not present

## 2021-09-15 DIAGNOSIS — F1090 Alcohol use, unspecified, uncomplicated: Secondary | ICD-10-CM

## 2021-09-15 DIAGNOSIS — Z1159 Encounter for screening for other viral diseases: Secondary | ICD-10-CM | POA: Diagnosis not present

## 2021-09-15 NOTE — Assessment & Plan Note (Signed)
Discussed use.  He quits cold [place] for lent.

## 2021-09-15 NOTE — Assessment & Plan Note (Signed)
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 Will continue to monitor (guidelines recommend rescreening in 10 yrs).

## 2021-09-15 NOTE — Assessment & Plan Note (Signed)
Has epi pen.

## 2021-09-15 NOTE — Assessment & Plan Note (Signed)
Advanced directive discussion - brings in today, will be scanned. Wife Bryson Ha then daughter Mitchell Heir are HCPOA. No prolonged life support if terminal condition.

## 2021-09-15 NOTE — Patient Instructions (Addendum)
Labs today  Send [place] dates of shingles shots. It is a 2 shot series.  You are doing well today  Return as needed or in 1 year for next wellness visit.   Health Maintenance After Age 78 After age 72, you are at a higher risk for certain long-term diseases and infections as well as injuries from falls. Falls are a major cause of broken bones and head injuries in people who are older than age 56. Getting regular preventive care can help to keep you healthy and well. Preventive care includes getting regular testing and making lifestyle changes as recommended by your health care provider. Talk with your health care provider about: Which screenings and tests you should have. A screening is a test that checks for a disease when you have no symptoms. A diet and exercise plan that is right for you. What should I know about screenings and tests to prevent falls? Screening and testing are the best ways to find a health problem early. Early diagnosis and treatment give you the best chance of managing medical conditions that are common after age 27. Certain conditions and lifestyle choices may make you more likely to have a fall. Your health care provider may recommend: Regular vision checks. Poor vision and conditions such as cataracts can make you more likely to have a fall. If you wear glasses, make sure to get your prescription updated if your vision changes. Medicine review. Work with your health care provider to regularly review all of the medicines you are taking, including over-the-counter medicines. Ask your health care provider about any side effects that may make you more likely to have a fall. Tell your health care provider if any medicines that you take make you feel dizzy or sleepy. Osteoporosis screening. Osteoporosis is a condition that causes the bones to get weaker. This can make the bones weak and cause them to break more easily. Blood pressure screening. Blood pressure changes and medicines to  control blood pressure can make you feel dizzy. Strength and balance checks. Your health care provider may recommend certain tests to check your strength and balance while standing, walking, or changing positions. Foot health exam. Foot pain and numbness, as well as not wearing proper footwear, can make you more likely to have a fall. Depression screening. You may be more likely to have a fall if you have a fear of falling, feel emotionally low, or feel unable to do activities that you used to do. Alcohol use screening. Using too much alcohol can affect your balance and may make you more likely to have a fall. What actions can I take to lower my risk of falls? General instructions Talk with your health care provider about your risks for falling. Tell your health care provider if: You fall. Be sure to tell your health care provider about all falls, even ones that seem minor. You feel dizzy, sleepy, or off-balance. Take over-the-counter and prescription medicines only as told by your health care provider. These include any supplements. Eat a healthy diet and maintain a healthy weight. A healthy diet includes low-fat dairy products, low-fat (lean) meats, and fiber from whole grains, beans, and lots of fruits and vegetables. Home safety Remove any tripping hazards, such as rugs, cords, and clutter. Install safety equipment such as grab bars in bathrooms and safety rails on stairs. Keep rooms and walkways well-lit. Activity  Follow a regular exercise program to stay fit. This will help you maintain your balance. Ask your health care  provider what types of exercise are appropriate for you. If you need a cane or walker, use it as recommended by your health care provider. Wear supportive shoes that have nonskid soles. Lifestyle Do not drink alcohol if your health care provider tells you not to drink. If you drink alcohol, limit how much you have: 0-1 drink a day for women. 0-2 drinks a day for  men. Be aware of how much alcohol is in your drink. In the [place] one drink equals one typical bottle of beer (12 oz), one-half glass of wine (5 oz), or one shot of hard liquor (1 oz). Do not use any products that contain nicotine or tobacco, such as cigarettes and e-cigarettes. If you need help quitting, ask your health care provider. Summary Having a healthy lifestyle and getting preventive care can help to protect your health and wellness after age 1. Screening and testing are the best way to find a health problem early and help you avoid having a fall. Early diagnosis and treatment give you the best chance for managing medical conditions that are more common for people who are older than age 10. Falls are a major cause of broken bones and head injuries in people who are older than age 44. Take precautions to prevent a fall at home. Work with your health care provider to learn what changes you can make to improve your health and wellness and to prevent falls. This information is not intended to replace advice given to you by your health care provider. Make sure you discuss any questions you have with your health care provider. Document Revised: 01/08/2021 Document Reviewed: 10/16/2020 Elsevier Patient Education  2022 [employer].

## 2021-09-15 NOTE — Assessment & Plan Note (Signed)
Chronic off meds. Update FLP.  The 10-year ASCVD risk score (Arnett DK, et al., 2019) is: 29.8%   Values used to calculate the score:     Age: 80 years     Sex: Male     Is Non-Hispanic African American: No     Diabetic: No     Tobacco smoker: No     Systolic Blood Pressure: 600 mmHg     Is BP treated: No     HDL Cholesterol: 84.3 mg/dL     Total Cholesterol: 205 mg/dL

## 2021-09-15 NOTE — Progress Notes (Signed)
Patient ID: Patrick Sawyer, male    DOB: [birth year], 80 y.o.   MRN: [id number]  This visit was conducted in person.  BP 140/82   Pulse 61   Temp 97.7 F (36.5 C) (Temporal)   Ht 6' (1.829 m)   Wt 232 lb 1 oz (105.3 kg)   SpO2 98%   BMI 31.47 kg/m    CC: CPE Subjective:   HPI: Patrick Sawyer is a 80 y.o. male presenting on 09/15/2021 for Annual Exam (Prt 2. )   Saw health advisor last week for medicare wellness visit. Note reviewed.   No results found.  Flowsheet Row Clinical Support from 09/07/2021 in [facility]  PHQ-2 Total Score 0       Fall Risk  09/07/2021 07/28/2020 07/26/2019 07/12/2018 07/14/2017  Falls in the past year? 0 0 0 No No  Number falls in past yr: 0 - - - -  Injury with Fall? 0 - - - -  Risk for fall due to : No Fall Risks - - - -  Follow up Falls prevention discussed - - - -        Preventative: COLONOSCOPY 01/2015 benign HP polyp, pt thinks rec rpt 5 yrs Tiffany Kocher) COLONOSCOPY 02/2020 - 1 HP, diverticulosis, int hem ([facility]) Prostate cancer screening - discussed, may consider stopping Lung cancer screening - quit smoking 1991. Not eligible.  Flu shot yearly  COVID vaccine - 12/2019, 01/2020, booster 08/2020, bivalent booster 07/2021 all Pfizer  Td 2009 Pneumovax 06/2016, prevnar 2016 Zostavax 2007 Shingrix - discussed  Advanced directive discussion - brings in today, will be scanned. Wife Bryson Ha then daughter Mitchell Heir are HCPOA. No prolonged life support if terminal condition.  Seat belt use discussed  Sunscreen use discussed. No changing moles. Sees derm yearly (Dr Nehemiah Massed).  Quit smoking 1991  Alcohol - drinking 2 shots bourbon and 2 glasses of wine/night. Bourbon helps sleep. Gives up alcohol for lent.  Dentist Q6 mo  Eye exam - yearly Bowel - no diarrhea/constipation. Known hemorrhoids.  Bladder - no incontinence.   Lives with wife Occupation: retired, was Optometrist Edu: BS  accounting Activity: exercising at home with free weights  Diet: good water, fruits/vegetables daily      Relevant past medical, surgical, family and social history reviewed and updated as indicated. Interim medical history since our last visit reviewed. Allergies and medications reviewed and updated. Outpatient Medications Prior to Visit  Medication Sig Dispense Refill   aspirin 81 MG EC tablet Take 81 mg by mouth daily. Swallow whole.     Cyanocobalamin (VITAMIN B-12 PO) Take 1,000 mcg by mouth daily.     glucosamine-chondroitin 500-400 MG tablet Take 2 tablets by mouth daily.     ketoconazole (NIZORAL) 2 % cream Apply to the feet and between the toes QHS. 60 g 6   Multiple Vitamins-Minerals (MULTIVITAMIN ADULT PO) Take 1 capsule by mouth daily.     Omega-3 Fatty Acids (FISH OIL) 1000 MG CAPS Take 1 capsule by mouth daily.     No facility-administered medications prior to visit.     Per HPI unless specifically indicated in ROS section below Review of Systems  Objective:  BP 140/82   Pulse 61   Temp 97.7 F (36.5 C) (Temporal)   Ht 6' (1.829 m)   Wt 232 lb 1 oz (105.3 kg)   SpO2 98%   BMI 31.47 kg/m   Wt Readings from Last 3 Encounters:  09/15/21 232 lb 1  oz (105.3 kg)  09/07/21 230 lb (104.3 kg)  06/14/21 237 lb 4 oz (107.6 kg)      Physical Exam Vitals and nursing note reviewed.  Constitutional:      General: He is not in acute distress.    Appearance: Normal appearance. He is well-developed. He is not ill-appearing.  HENT:     Head: Normocephalic and atraumatic.     Right Ear: Hearing, tympanic membrane, ear canal and external ear normal.     Left Ear: Hearing, tympanic membrane, ear canal and external ear normal.  Eyes:     General: No scleral icterus.    Extraocular Movements: Extraocular movements intact.     Conjunctiva/sclera: Conjunctivae normal.     Pupils: Pupils are equal, round, and reactive to light.  Neck:     Thyroid: No thyroid mass or  thyromegaly.     Vascular: No carotid bruit.  Cardiovascular:     Rate and Rhythm: Normal rate and regular rhythm.     Pulses: Normal pulses.          Radial pulses are 2+ on the right side and 2+ on the left side.     Heart sounds: Normal heart sounds. No murmur heard. Pulmonary:     Effort: Pulmonary effort is normal. No respiratory distress.     Breath sounds: Normal breath sounds. No wheezing, rhonchi or rales.  Abdominal:     General: Bowel sounds are normal. There is no distension.     Palpations: Abdomen is soft. There is no mass.     Tenderness: There is no abdominal tenderness. There is no guarding or rebound.     Hernia: No hernia is present.  Musculoskeletal:        General: Normal range of motion.     Cervical back: Normal range of motion and neck supple.     Right lower leg: No edema.     Left lower leg: No edema.  Lymphadenopathy:     Cervical: No cervical adenopathy.  Skin:    General: Skin is warm and dry.     Findings: No rash.  Neurological:     General: No focal deficit present.     Mental Status: He is alert and oriented to person, place, and time.  Psychiatric:        Mood and Affect: Mood normal.        Behavior: Behavior normal.        Thought Content: Thought content normal.        Judgment: Judgment normal.      Results for orders placed or performed during the hospital encounter of 61/60/73  Basic metabolic panel  Result Value Ref Range   Sodium 139 135 - 145 mmol/L   Potassium 4.4 3.5 - 5.1 mmol/L   Chloride 101 98 - 111 mmol/L   CO2 26 22 - 32 mmol/L   Glucose, Bld 107 (H) 70 - 99 mg/dL   BUN 12 8 - 23 mg/dL   Creatinine, Ser 0.99 0.61 - 1.24 mg/dL   Calcium 9.2 8.9 - 10.3 mg/dL   GFR, Estimated >60 >60 mL/min   Anion gap 12 5 - 15  CBC with Differential  Result Value Ref Range   WBC 6.9 4.0 - 10.5 K/uL   RBC 5.24 4.22 - 5.81 MIL/uL   Hemoglobin 18.2 (H) 13.0 - 17.0 g/dL   HCT 52.5 (H) 39.0 - 52.0 %   MCV 100.2 (H) 80.0 - 100.0 fL    MCH  34.7 (H) 26.0 - 34.0 pg   MCHC 34.7 30.0 - 36.0 g/dL   RDW 12.4 11.5 - 15.5 %   Platelets 266 150 - 400 K/uL   nRBC 0.0 0.0 - 0.2 %   Neutrophils Relative % 41 %   Neutro Abs 2.8 1.7 - 7.7 K/uL   Lymphocytes Relative 41 %   Lymphs Abs 2.8 0.7 - 4.0 K/uL   Monocytes Relative 12 %   Monocytes Absolute 0.8 0.1 - 1.0 K/uL   Eosinophils Relative 5 %   Eosinophils Absolute 0.3 0.0 - 0.5 K/uL   Basophils Relative 1 %   Basophils Absolute 0.1 0.0 - 0.1 K/uL   Immature Granulocytes 0 %   Abs Immature Granulocytes 0.03 0.00 - 0.07 K/uL  Troponin I (High Sensitivity)  Result Value Ref Range   Troponin I (High Sensitivity) 4 <18 ng/L  Troponin I (High Sensitivity)  Result Value Ref Range   Troponin I (High Sensitivity) 3 <18 ng/L    Assessment & Plan:  This visit occurred during the SARS-CoV-2 public health emergency.  Safety protocols were in place, including screening questions prior to the visit, additional usage of staff PPE, and extensive cleaning of exam room while observing appropriate contact time as indicated for disinfecting solutions.   Problem List Items Addressed This Visit     Advanced care planning/counseling discussion - Primary (Chronic)    Advanced directive discussion - brings in today, will be scanned. Wife Bryson Ha then daughter Mitchell Heir are HCPOA. No prolonged life support if terminal condition.       Abdominal aortic ectasia (HCC)    Mild ectasia 2.5cm by [place] 07/2020. Will continue to monitor (guidelines recommend rescreening in 10 yrs).      Alcohol use    Discussed use.  He quits cold [place] for lent.       Traumatic enucleation of left eye   HLD (hyperlipidemia)    Chronic off meds. Update FLP.  The 10-year ASCVD risk score (Arnett DK, et al., 2019) is: 29.8%   Values used to calculate the score:     Age: 40 years     Sex: Male     Is Non-Hispanic African American: No     Diabetic: No     Tobacco smoker: No     Systolic Blood Pressure: 027 mmHg      Is BP treated: No     HDL Cholesterol: 84.3 mg/dL     Total Cholesterol: 205 mg/dL       Low serum vitamin B12    Update B12 on 1046mcg daily.       Anaphylaxis due to hymenoptera venom    Has epi pen.      Other Visit Diagnoses     Polycythemia       Need for hepatitis C screening test            No orders of the defined types were placed in this encounter.  No orders of the defined types were placed in this encounter.   Patient instructions: Labs today  Send [place] dates of shingles shots. It is a 2 shot series.  You are doing well today  Return as needed or in 1 year for next wellness visit.   Follow up plan: Return in about 1 year (around 09/15/2022) for annual exam, prior fasting for blood work.  Ria Bush, MD

## 2021-09-15 NOTE — Assessment & Plan Note (Addendum)
Update B12 on 1061mcg daily.

## 2021-09-16 LAB — CBC WITH DIFFERENTIAL/PLATELET
Basophils Absolute: 0.1 10*3/uL (ref 0.0–0.1)
Basophils Relative: 1 % (ref 0.0–3.0)
Eosinophils Absolute: 0.2 10*3/uL (ref 0.0–0.7)
Eosinophils Relative: 3 % (ref 0.0–5.0)
HCT: 46.3 % (ref 39.0–52.0)
Hemoglobin: 15.4 g/dL (ref 13.0–17.0)
Lymphocytes Relative: 29 % (ref 12.0–46.0)
Lymphs Abs: 2.2 10*3/uL (ref 0.7–4.0)
MCHC: 33.3 g/dL (ref 30.0–36.0)
MCV: 102.9 fl — ABNORMAL HIGH (ref 78.0–100.0)
Monocytes Absolute: 0.7 10*3/uL (ref 0.1–1.0)
Monocytes Relative: 9.5 % (ref 3.0–12.0)
Neutro Abs: 4.4 10*3/uL (ref 1.4–7.7)
Neutrophils Relative %: 57.5 % (ref 43.0–77.0)
Platelets: 248 10*3/uL (ref 150.0–400.0)
RBC: 4.5 Mil/uL (ref 4.22–5.81)
RDW: 12.8 % (ref 11.5–15.5)
WBC: 7.6 10*3/uL (ref 4.0–10.5)

## 2021-09-16 LAB — COMPREHENSIVE METABOLIC PANEL
ALT: 34 U/L (ref 0–53)
AST: 29 U/L (ref 0–37)
Albumin: 4.6 g/dL (ref 3.5–5.2)
Alkaline Phosphatase: 61 U/L (ref 39–117)
BUN: 16 mg/dL (ref 6–23)
CO2: 28 mEq/L (ref 19–32)
Calcium: 9.7 mg/dL (ref 8.4–10.5)
Chloride: 103 mEq/L (ref 96–112)
Creatinine, Ser: 0.98 mg/dL (ref 0.40–1.50)
GFR: 73.93 mL/min (ref >60.00)
Glucose, Bld: 93 mg/dL (ref 70–99)
Potassium: 4.4 mEq/L (ref 3.5–5.1)
Sodium: 139 mEq/L (ref 135–145)
Total Bilirubin: 0.9 mg/dL (ref 0.2–1.2)
Total Protein: 7.2 g/dL (ref 6.0–8.3)

## 2021-09-16 LAB — LIPID PANEL
Cholesterol: 219 mg/dL — ABNORMAL HIGH (ref 0–200)
HDL: 101.9 mg/dL (ref >39.00)
LDL Cholesterol: 106 mg/dL — ABNORMAL HIGH (ref 0–99)
NonHDL: 117.03
Total CHOL/HDL Ratio: 2
Triglycerides: 53 mg/dL (ref 0.0–149.0)
VLDL: 10.6 mg/dL (ref 0.0–40.0)

## 2021-09-16 LAB — HEPATITIS C ANTIBODY
Hepatitis C Ab: NONREACTIVE
SIGNAL TO CUT-OFF: 0.06 (ref <1.00)

## 2021-09-16 LAB — VITAMIN B12: Vitamin B-12: 766 pg/mL (ref 211–911)

## 2021-09-16 LAB — PATHOLOGIST SMEAR REVIEW

## 2021-12-30 ENCOUNTER — Other Ambulatory Visit: Payer: Self-pay

## 2021-12-30 ENCOUNTER — Ambulatory Visit (INDEPENDENT_AMBULATORY_CARE_PROVIDER_SITE_OTHER): Payer: Medicare Other | Admitting: Dermatology

## 2021-12-30 DIAGNOSIS — Z86018 Personal history of other benign neoplasm: Secondary | ICD-10-CM | POA: Diagnosis not present

## 2021-12-30 DIAGNOSIS — D229 Melanocytic nevi, unspecified: Secondary | ICD-10-CM | POA: Diagnosis not present

## 2021-12-30 DIAGNOSIS — I8393 Asymptomatic varicose veins of bilateral lower extremities: Secondary | ICD-10-CM | POA: Diagnosis not present

## 2021-12-30 DIAGNOSIS — L814 Other melanin hyperpigmentation: Secondary | ICD-10-CM

## 2021-12-30 DIAGNOSIS — L821 Other seborrheic keratosis: Secondary | ICD-10-CM

## 2021-12-30 DIAGNOSIS — Z85828 Personal history of other malignant neoplasm of skin: Secondary | ICD-10-CM | POA: Diagnosis not present

## 2021-12-30 DIAGNOSIS — Z1283 Encounter for screening for malignant neoplasm of skin: Secondary | ICD-10-CM

## 2021-12-30 DIAGNOSIS — L578 Other skin changes due to chronic exposure to nonionizing radiation: Secondary | ICD-10-CM

## 2021-12-30 DIAGNOSIS — D1800 Hemangioma unspecified site: Secondary | ICD-10-CM | POA: Diagnosis not present

## 2021-12-30 DIAGNOSIS — L738 Other specified follicular disorders: Secondary | ICD-10-CM | POA: Diagnosis not present

## 2021-12-30 NOTE — Patient Instructions (Signed)

## 2021-12-30 NOTE — Progress Notes (Signed)
° °  Follow-Up Visit   Subjective  Patrick Sawyer is a 81 y.o. male who presents for the following: Annual Exam (History of BCC and dysplastic nevus - TBSE today). The patient presents for Total-Body Skin Exam (TBSE) for skin cancer screening and mole check.  The patient has spots, moles and lesions to be evaluated, some may be new or changing and the patient has concerns that these could be cancer.  The following portions of the chart were reviewed this encounter and updated as appropriate:   Tobacco   Allergies   Meds   Problems   Med Hx   Surg Hx   Fam Hx      Review of Systems:  No other skin or systemic complaints except as noted in HPI or Assessment and Plan.  Objective  Well appearing patient in no apparent distress; mood and affect are within normal limits.  A full examination was performed including scalp, head, eyes, ears, nose, lips, neck, chest, axillae, abdomen, back, buttocks, bilateral upper extremities, bilateral lower extremities, hands, feet, fingers, toes, fingernails, and toenails. All findings within normal limits unless otherwise noted below.  Nose Yellow papule  Legs Varicose veins   Assessment & Plan   History of Basal Cell Carcinoma of the Skin - No evidence of recurrence today - Recommend regular full body skin exams - Recommend daily broad spectrum sunscreen SPF 30+ to sun-exposed areas, reapply every 2 hours as needed.  - Call if any new or changing lesions are noted between office visits  History of Dysplastic Nevi - No evidence of recurrence today - Recommend regular full body skin exams - Recommend daily broad spectrum sunscreen SPF 30+ to sun-exposed areas, reapply every 2 hours as needed.  - Call if any new or changing lesions are noted between office visits  Lentigines - Scattered tan macules - Due to sun exposure - Benign-appearing, observe - Recommend daily broad spectrum sunscreen SPF 30+ to sun-exposed areas, reapply every 2 hours  as needed. - Call for any changes  Seborrheic Keratoses - Stuck-on, waxy, tan-brown papules and/or plaques  - Benign-appearing - Discussed benign etiology and prognosis. - Observe - Call for any changes  Melanocytic Nevi - Tan-brown and/or pink-flesh-colored symmetric macules and papules - Benign appearing on exam today - Observation - Call clinic for new or changing moles - Recommend daily use of broad spectrum spf 30+ sunscreen to sun-exposed areas.   Hemangiomas - Red papules - Discussed benign nature - Observe - Call for any changes  Actinic Damage - Chronic condition, secondary to cumulative UV/sun exposure - diffuse scaly erythematous macules with underlying dyspigmentation - Recommend daily broad spectrum sunscreen SPF 30+ to sun-exposed areas, reapply every 2 hours as needed.  - Staying in the shade or wearing long sleeves, sun glasses (UVA+UVB protection) and wide brim hats (4-inch brim around the entire circumference of the hat) are also recommended for sun protection.  - Call for new or changing lesions.  Skin cancer screening performed today.  Sebaceous hyperplasia Nose Benign  Asymptomatic varicose veins of both lower extremities Leg Benign  Skin cancer screening  Return in about 1 year (around 12/30/2022) for TBSE.  I, Ashok Cordia, CMA, am acting as scribe for Patrick Ser, MD . Documentation: I have reviewed the above documentation for accuracy and completeness, and I agree with the above.  Patrick Ser, MD

## 2022-01-08 ENCOUNTER — Encounter: Payer: Self-pay | Admitting: Dermatology

## 2022-03-01 DIAGNOSIS — H353112 Nonexudative age-related macular degeneration, right eye, intermediate dry stage: Secondary | ICD-10-CM | POA: Diagnosis not present

## 2022-03-09 DIAGNOSIS — Z23 Encounter for immunization: Secondary | ICD-10-CM | POA: Diagnosis not present

## 2022-06-17 ENCOUNTER — Encounter: Payer: Self-pay | Admitting: Family Medicine

## 2022-06-20 MED ORDER — EPINEPHRINE 0.3 MG/0.3ML IJ SOAJ: 0.3000 mg | INTRAMUSCULAR | 1 refills | Status: DC | PRN

## 2022-06-20 NOTE — Telephone Encounter (Signed)
EpiPen not on current med list  Last OV:  09/15/21, CPE Next OV:  09/20/22, CPE

## 2022-06-20 NOTE — Addendum Note (Signed)
Addended by: Ria Bush on: 06/20/2022 09:11 AM   Modules accepted: Orders

## 2022-07-17 DIAGNOSIS — Z23 Encounter for immunization: Secondary | ICD-10-CM | POA: Diagnosis not present

## 2022-08-09 DIAGNOSIS — Z23 Encounter for immunization: Secondary | ICD-10-CM | POA: Diagnosis not present

## 2022-08-14 ENCOUNTER — Other Ambulatory Visit: Payer: Self-pay

## 2022-08-14 ENCOUNTER — Encounter: Payer: Self-pay | Admitting: Emergency Medicine

## 2022-08-14 ENCOUNTER — Emergency Department
Admission: EM | Admit: 2022-08-14 | Discharge: 2022-08-14 | Disposition: A | Discharge status: Home / Self Care | Payer: Medicare Other | Attending: Emergency Medicine | Admitting: Emergency Medicine

## 2022-08-14 DIAGNOSIS — U071 COVID-19: Secondary | ICD-10-CM | POA: Diagnosis not present

## 2022-08-14 DIAGNOSIS — R509 Fever, unspecified: Secondary | ICD-10-CM | POA: Diagnosis present

## 2022-08-14 MED ORDER — NIRMATRELVIR/RITONAVIR (PAXLOVID)TABLET: 3.0000 | ORAL_TABLET | Freq: Two times a day (BID) | ORAL | 0 refills | Status: AC

## 2022-08-14 NOTE — ED Triage Notes (Signed)
Pt reports tested (+) for covid yesterday and is having fever, sore throat, and general bodyaches.

## 2022-08-14 NOTE — ED Provider Notes (Signed)
[facility] Provider Note    Event Date/Time   First MD Initiated Contact with Patient 08/14/22 [phone]     (approximate)   History   Fever, Sore Throat, and Generalized Body Aches   HPI  Patrick Sawyer is a 82 y.o. male with no significant past medical history and currently no daily medications presents to the emergency department after being diagnosed with COVID.  He reports that he has had all of his COVID boosters with the last 1 being received 5 days ago.  Patient developed fever, sore throat, general body aches 2 days ago.  COVID test was positive both yesterday and today.  No known COVID exposure.     Physical Exam   Triage Vital Signs: ED Triage Vitals  Enc Vitals Group     BP 08/14/22 0950 (!) 147/74     Pulse Rate 08/14/22 0950 80     Resp 08/14/22 0950 20     Temp 08/14/22 0950 98.8 F (37.1 C)     Temp Source 08/14/22 0950 Oral     SpO2 08/14/22 0950 95 %     Weight 08/14/22 0946 232 lb 2.3 oz (105.3 kg)     Height 08/14/22 0946 6' (1.829 m)     Head Circumference --      Peak Flow --      Pain Score 08/14/22 0946 3     Pain Loc --      Pain Edu? --      Excl. in [place]? --     Most recent vital signs: Vitals:   08/14/22 0950  BP: (!) 147/74  Pulse: 80  Resp: 20  Temp: 98.8 F (37.1 C)  SpO2: 95%     General: Awake, no distress.  CV:  Good peripheral perfusion.  Resp:  Normal effort.  Breath sounds clear to auscultation Abd:  No distention.  Other:  Mild erythema noted in the posterior oropharynx.  Tonsils flat/absent and without exudate.   ED Results / Procedures / Treatments   Labs (all labs ordered are listed, but only abnormal results are displayed) Labs Reviewed - No data to display   EKG  Not indicated   RADIOLOGY Not indicated   PROCEDURES:  Critical Care performed: No  Procedures   MEDICATIONS ORDERED IN ED: Medications - No data to display   IMPRESSION / MDM / [place]  / ED COURSE  I reviewed the triage vital signs and the nursing notes.                              Differential diagnosis includes, but is not limited to, COVID, viral URI  Patient's presentation is most consistent with acute, uncomplicated illness.  81 year old male presenting to the emergency department after testing positive for COVID yesterday and today.  See HPI for further details.  Exam is reassuring.  Patient has no history of kidney disease.  Labs reviewed from the last couple years and he has had GFR is greater than 60 consistently.  Plan will be to prescribe Paxlovid and have him follow the quarantine instructions.  He is to follow-up with his primary care provider if not improving over the week or sooner if he is getting worse.  ER return precautions advised.     FINAL CLINICAL IMPRESSION(S) / ED DIAGNOSES   Final diagnoses:  COVID     Rx / DC Orders   ED Discharge Orders  Ordered    nirmatrelvir/ritonavir EUA (PAXLOVID) 20 x 150 MG & 10 x '100MG'$  TABS  2 times daily        08/14/22 1008             Note:  This document was prepared using Dragon voice recognition software and may include unintentional dictation errors.   Victorino Dike, FNP 08/14/22 1303    Lucillie Garfinkel, MD 08/15/22 [phone]

## 2022-08-19 ENCOUNTER — Telehealth: Payer: Self-pay

## 2022-08-19 NOTE — Telephone Encounter (Signed)
        Patient  visited [facility] on 10/1    Telephone encounter attempt :  1st  A HIPAA compliant voice message was left requesting a return call.  Instructed patient to call back    [facility]  [phone] [address], [place], [place] [address] Phone: [phone] Email: [email or web address]

## 2022-08-24 ENCOUNTER — Telehealth: Payer: Self-pay

## 2022-08-24 NOTE — Chronic Care Management (AMB) (Addendum)
    Chronic Care Management Pharmacy Assistant   Name: Patrick Sawyer  MRN: [id number] DOB: 15-Dec-1940  Reason for Encounter: Hospital Follow Up Non CCM    Medications: Outpatient Encounter Medications as of 08/24/2022  Medication Sig   aspirin 81 MG EC tablet Take 81 mg by mouth daily. Swallow whole.   Cyanocobalamin (VITAMIN B-12 PO) Take 1,000 mcg by mouth daily.   EPINEPHrine (EPIPEN 2-PAK) 0.3 mg/0.3 mL IJ SOAJ injection Inject 0.3 mg into the muscle as needed for anaphylaxis.   glucosamine-chondroitin 500-400 MG tablet Take 2 tablets by mouth daily.   ketoconazole (NIZORAL) 2 % cream Apply to the feet and between the toes QHS.   Multiple Vitamins-Minerals (MULTIVITAMIN ADULT PO) Take 1 capsule by mouth daily.   Omega-3 Fatty Acids (FISH OIL) 1000 MG CAPS Take 1 capsule by mouth daily.   No facility-administered encounter medications on file as of 08/24/2022.     Reviewed hospital notes for details of recent visit. Has patient been contacted by Transitions of Care team? No Has patient seen PCP/specialist for hospital follow up (summarize OV if yes): No  Admitted to the ED on 08/14/22. Discharge date was 08/14/22.  Discharged from [facility] ED.  Discharge diagnosis (Principal Problem): COVID Patient was discharged to Home  Brief summary of hospital course:  testing positive for COVID yesterday and today. Plan will be to prescribe Paxlovid and have him follow the quarantine instructions  New?Medications Started at [facility] Discharge:?? -Started nirmatrelvir  EUA  Medications that remain the same after Hospital Discharge:??  -All other medications will remain the same.    Next CCM appt: none  Other upcoming appts: PCP appointment on 09/09/22 AW[facility] phonecall  09/20/22 PCP  Charlene Brooke, PharmD notified and will determine if action is needed.  Avel Sensor, [place]  [phone]    Pharmacist addendum: Patient has appropriate follow up  scheduled. No action needed at this time.  Charlene Brooke, PharmD, BCACP 08/30/22 9:50 AM

## 2022-09-06 DIAGNOSIS — H353112 Nonexudative age-related macular degeneration, right eye, intermediate dry stage: Secondary | ICD-10-CM | POA: Diagnosis not present

## 2022-09-06 DIAGNOSIS — H2512 Age-related nuclear cataract, left eye: Secondary | ICD-10-CM | POA: Diagnosis not present

## 2022-09-09 ENCOUNTER — Ambulatory Visit (INDEPENDENT_AMBULATORY_CARE_PROVIDER_SITE_OTHER): Payer: Medicare Other

## 2022-09-09 VITALS — Ht 72.0 in | Wt 232.0 lb

## 2022-09-09 DIAGNOSIS — Z0000 Encounter for general adult medical examination without abnormal findings: Secondary | ICD-10-CM | POA: Diagnosis not present

## 2022-09-09 NOTE — Progress Notes (Signed)
Subjective:   Patrick Sawyer is a 81 y.o. male who presents for Medicare Annual/Subsequent preventive examination.  Review of Systems    Virtual Visit via Telephone Note  I connected with  Domenick Bookbinder on 09/09/22 at  9:00 AM EDT by telephone and verified that I am speaking with the correct person using two identifiers.  Location: Patient: Home Provider: Office Persons participating in the virtual visit: patient/Nurse Health Advisor   I discussed the limitations, risks, security and privacy concerns of performing an evaluation and management service by telephone and the availability of in person appointments. The patient expressed understanding and agreed to proceed.  Interactive audio and video telecommunications were attempted between this nurse and patient, however failed, due to patient having technical difficulties OR patient did not have access to video capability.  We continued and completed visit with audio only.  Some vital signs may be absent or patient reported.   Criselda Peaches, LPN  Cardiac Risk Factors include: advanced age (>43mn, >>77women);male gender     Objective:    Today's Vitals   09/09/22 0903  Weight: 232 lb (105.2 kg)  Height: 6' (1.829 m)   Body mass index is 31.46 kg/m.     09/09/2022    9:11 AM 08/14/2022   10:04 AM 09/07/2021    9:09 AM 06/09/2021    9:33 AM 07/12/2018    9:13 AM 07/10/2017    8:30 AM 06/29/2016    9:37 AM  Advanced Directives  Does Patient Have a Medical Advance Directive? Yes No Yes No Yes Yes Yes  Type of AParamedicof ACandler[place]McA[place]Living will  H[place]alfantLiving will  HEarlingLiving will HBrickervilleLiving will [place]alstonLiving will  Does patient want to make changes to medical advance directive? No - Patient declined  Yes (MAU/Ambulatory/Procedural Areas - Information given)      Copy of [place]in Chart? Yes - validated most recent copy scanned in chart (See row information)  No - copy requested  No - copy requested No - copy requested   Would patient like information on creating a medical advance directive?  No - Patient declined  No - Patient declined       Current Medications (verified) Outpatient Encounter Medications as of 09/09/2022  Medication Sig   aspirin 81 MG EC tablet Take 81 mg by mouth daily. Swallow whole.   Cyanocobalamin (VITAMIN B-12 PO) Take 1,000 mcg by mouth daily.   EPINEPHrine (EPIPEN 2-PAK) 0.3 mg/0.3 mL IJ SOAJ injection Inject 0.3 mg into the muscle as needed for anaphylaxis.   glucosamine-chondroitin 500-400 MG tablet Take 2 tablets by mouth daily.   ketoconazole (NIZORAL) 2 % cream Apply to the feet and between the toes QHS.   Multiple Vitamins-Minerals (MULTIVITAMIN ADULT PO) Take 1 capsule by mouth daily.   Omega-3 Fatty Acids (FISH OIL) 1000 MG CAPS Take 1 capsule by mouth daily.   No facility-administered encounter medications on file as of 09/09/2022.    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   HLD (hyperlipidemia)    Obesity, Class I, BMI 30-34.9    Seasonal allergies    Traumatic enucleation of left eye    Past Surgical History:  Procedure Laterality Date   APPENDECTOMY  1959   COLONOSCOPY  01/2015   small polyps, rpt 5 yrs Tiffany Kocher)   COLONOSCOPY  06/2012   small polyps Vira Agar)   COLONOSCOPY  02/2020   1 HP, diverticulosis, int hem  (Pyrtle)   ENUCLEATION Left 1969   HEMORRHOID BANDING  04/2015   Dr Tamala Julian   INGUINAL HERNIA REPAIR Right    MENISCUS REPAIR Left 2005   MOHS SURGERY  04/2014   VARICOSE VEIN SURGERY  2006   Family History  Problem Relation Age of Onset   Alcohol abuse Father    Alcohol abuse Brother    Colon cancer Brother 38   Arthritis Mother        knee   Cancer Brother        lung (smoker)   Cancer Brother        colon   Hypertension Brother    CAD Brother        CABG (smoker, EtOH)   Diabetes Neg Hx    Colon polyps Neg Hx    Esophageal cancer Neg Hx    Stomach cancer Neg Hx    Rectal cancer Neg Hx    Social History   Socioeconomic History   Marital status: Married    Spouse name: Not on file   Number of children: Not on file   Years of education: Not on file   Highest education level: Not on file  Occupational History   Not on file  Tobacco Use   Smoking status: Former    Types: Cigarettes    Quit date: 11/14/1989    Years since quitting: 32.8   Smokeless tobacco: Never  Vaping Use   Vaping Use: Never used  Substance and Sexual Activity   Alcohol use: Yes    Alcohol/week: 28.0 standard drinks of alcohol    Types: 28 Shots of liquor per week    Comment: bourbon   Drug use: No   Sexual activity: Never  Other Topics Concern   Not on file  Social History Narrative   Lives with wife   Occupation: retired, was Optometrist   Edu: BS accounting   Activity: gym 3d/wk, stays busy with housework, traveling   Diet: good water, fruits/vegetables daily   Social Determinants of Health   Financial Resource Strain: [place]  (09/09/2022)   Overall Financial Resource Strain (CARDIA)    Difficulty of Paying Living Expenses: Not hard at all  Food Insecurity: No Food Insecurity (09/09/2022)   Hunger Vital Sign    Worried About Running Out of Food in the Last Year: Never true    [place] in the Last Year: Never true  Transportation Needs: No Transportation Needs (09/09/2022)    PRAPARE - Hydrologist (Medical): No    Lack of Transportation (Non-Medical): No  Physical Activity: Sufficiently Active (09/09/2022)   Exercise Vital Sign    Days of Exercise per Week: 3 days    Minutes of Exercise per Session: 140 min  Stress: No Stress Concern Present (09/09/2022)   [place] -  Occupational Stress Questionnaire    Feeling of Stress : Not at all  Social Connections: Moderately Isolated (09/09/2022)   Social Connection and Isolation Panel [NHANES]    Frequency of Communication with Friends and Family: More than three times a week    Frequency of Social Gatherings with Friends and Family: More than three times a week    Attends Religious Services: Never    Marine scientist or Organizations: No    Attends Music therapist: Never    Marital Status: Married    Tobacco Counseling Counseling given: Not Answered   Clinical Intake:  Pre-visit preparation completed: No  Pain : No/denies pain     BMI - recorded: 31.46 Nutritional Status: BMI > 30  Obese Nutritional Risks: None Diabetes: No  How often do you need to have someone help you when you read instructions, pamphlets, or other written materials from your doctor or pharmacy?: 1 - Never  Diabetic? No  Interpreter Needed?: No  Information entered by :: Rolene Arbour LPN   Activities of Daily Living    09/09/2022    9:09 AM  In your present state of health, do you have any difficulty performing the following activities:  Hearing? 1  Comment Wears hearing aid rt ear  Vision? 0  Difficulty concentrating or making decisions? 0  Walking or climbing stairs? 0  Dressing or bathing? 0  Doing errands, shopping? 0  Preparing Food and eating ? N  Using the Toilet? N  In the past six months, have you accidently leaked urine? N  Do you have problems with loss of bowel control? N  Managing your Medications? N  Managing your  Finances? N  Housekeeping or managing your Housekeeping? N    Patient Care Team: Ria Bush, MD as PCP - General (Family Medicine) Ralene Bathe, MD as Consulting Physician (Dermatology) Karren Burly Deirdre Peer, MD as Referring Physician (Ophthalmology) Manya Silvas, MD (Inactive) as Consulting Physician (Gastroenterology)  Indicate any recent Medical Services you may have received from other than [facility] providers in the past year (date may be approximate).     Assessment:   This is a routine wellness examination for [place].  Hearing/Vision screen Hearing Screening - Comments:: Denies hearing difficulties   Vision Screening - Comments:: Wears rx glasses - up to date with routine eye exams with  Dr Mordecai Rasmussen  Dietary issues and exercise activities discussed: Current Exercise Habits: Home exercise routine, Type of exercise: walking, Time (Minutes): > 60, Frequency (Times/Week): 3, Weekly Exercise (Minutes/Week): 0, Intensity: Moderate, Exercise limited by: None identified   Goals Addressed               This Visit's Progress     Patient Stated (pt-stated)        Would like to continue increasing my exercise routine.       Depression Screen    09/09/2022    9:07 AM 09/07/2021    9:15 AM 07/28/2020    8:25 AM 07/26/2019    9:33 AM 07/12/2018    9:13 AM 07/10/2017    8:17 AM 06/29/2016    9:07 AM  PHQ 2/9 Scores  PHQ - 2 Score 0 0 0 0 0 0 0  PHQ- 9 Score     0 1     Fall Risk    09/09/2022    9:10 AM 09/07/2021    9:13 AM 07/28/2020    8:24 AM 07/26/2019    9:33 AM 07/12/2018  9:13 AM  Fall Risk   Falls in the past year? 0 0 0 0 No  Number falls in past yr: 0 0     Injury with Fall? 0 0     Risk for fall due to : No Fall Risks No Fall Risks     Follow up Falls prevention discussed Falls prevention discussed       FALL RISK PREVENTION PERTAINING TO THE HOME:  Any stairs in or around the home? Yes  If so, are there any without handrails? No   Home free of loose throw rugs in walkways, pet beds, electrical cords, etc? Yes  Adequate lighting in your home to reduce risk of falls? Yes   ASSISTIVE DEVICES UTILIZED TO PREVENT FALLS:  Life alert? No  Use of a cane, walker or w/c? No  Grab bars in the bathroom? No  Shower chair or bench in shower? Yes  Elevated toilet seat or a handicapped toilet? No   TIMED UP AND GO:  Was the test performed? No . Audio Visit   Cognitive Function:    07/12/2018    9:46 AM 07/10/2017    8:20 AM 06/29/2016    9:35 AM  MMSE - Mini Mental State Exam  Orientation to time '5 5 5  '$ Orientation to Place '5 5 5  '$ Registration '3 3 3  '$ Attention/ Calculation 0 0 0  Recall '3 3 3  '$ Language- name 2 objects 0 0 0  Language- repeat '1 1 1  '$ Language- follow 3 step command '3 3 3  '$ Language- read & follow direction 0 0 0  Write a sentence 0 0 0  Copy design 0 0 0  Total score '20 20 20        '$ 09/09/2022    9:11 AM  6CIT Screen  What Year? 0 points  What month? 0 points  What time? 0 points  Count back from 20 0 points  Months in reverse 0 points  Repeat phrase 0 points  Total Score 0 points    Immunizations Immunization History  Administered Date(s) Administered   Fluad Quad(high Dose 65+) 07/26/2019   Influenza, High Dose Seasonal PF 08/04/2016, 07/13/2020, 08/03/2021   Influenza-Unspecified 07/15/2018   PFIZER(Purple Top)SARS-COV-2 Vaccination 12/21/2019, 01/15/2020, 09/12/2020   Pfizer Covid-19 Vaccine Bivalent Booster 17yr & up 08/10/2021   Pneumococcal Conjugate-13 03/16/2015   Pneumococcal Polysaccharide-23 02/01/2010, 06/29/2016   Td 07/27/2008   Zoster, Live 07/27/2006    TDAP status: Due, Education has been provided regarding the importance of this vaccine. Advised may receive this vaccine at local pharmacy or Health Dept. Aware to provide a copy of the vaccination record if obtained from local pharmacy or Health Dept. Verbalized acceptance and understanding.  Flu Vaccine status:  Up to date  Pneumococcal vaccine status: Up to date  Covid-19 vaccine status: Completed vaccines  Qualifies for Shingles Vaccine? Yes   Zostavax completed No   Shingrix Completed?: No.    Education has been provided regarding the importance of this vaccine. Patient has been advised to call insurance company to determine out of pocket expense if they have not yet received this vaccine. Advised may also receive vaccine at local pharmacy or Health Dept. Verbalized acceptance and understanding.  Screening Tests Health Maintenance  Topic Date Due   COVID-19 Vaccine (5 - Pfizer risk series) 09/25/2022 (Originally 10/05/2021)   Zoster Vaccines- Shingrix (1 of 2) 12/10/2022 (Originally 05/04/1960)   INFLUENZA VACCINE  02/12/2023 (Originally 06/14/2022)   TETANUS/TDAP  09/10/2023 (Originally 11/14/2021)  Medicare Annual Wellness (AWV)  09/10/2023   Pneumonia Vaccine 72+ Years old  Completed   Hepatitis C Screening  Completed   HPV VACCINES  Aged Out   COLONOSCOPY (Pts 45-37yr Insurance coverage will need to be confirmed)  Discontinued    Health Maintenance  There are no preventive care reminders to display for this patient.   Colorectal cancer screening: No longer required.   Lung Cancer Screening: (Low Dose CT Chest recommended if Age 79-80years, 30 pack-year currently smoking OR have quit w/in 15years.) does not qualify.     Additional Screening:  Hepatitis C Screening: does not qualify; Completed   Vision Screening: Recommended annual ophthalmology exams for early detection of glaucoma and other disorders of the eye. Is the patient up to date with their annual eye exam?  Yes  Who is the provider or what is the name of the office in which the patient attends annual eye exams? Dr BMordecai RasmussenIf pt is not established with a provider, would they like to be referred to a provider to establish care? No .   Dental Screening: Recommended annual dental exams for proper oral  hygiene  Community Resource Referral / Chronic Care Management:  CRR required this visit?  No   CCM required this visit?  No      Plan:     I have personally reviewed and noted the following in the patient's chart:   Medical and social history Use of alcohol, tobacco or illicit drugs  Current medications and supplements including opioid prescriptions. Patient is not currently taking opioid prescriptions. Functional ability and status Nutritional status Physical activity Advanced directives List of other physicians Hospitalizations, surgeries, and ER visits in previous 12 months Vitals Screenings to include cognitive, depression, and falls Referrals and appointments  In addition, I have reviewed and discussed with patient certain preventive protocols, quality metrics, and best practice recommendations. A written personalized care plan for preventive services as well as general preventive health recommendations were provided to patient.     BCriselda Peaches LPN   [id number]  Nurse Notes: None

## 2022-09-09 NOTE — Patient Instructions (Addendum)
Patrick Sawyer , Thank you for taking time to come for your Medicare Wellness Visit. I appreciate your ongoing commitment to your health goals. Please review the following plan we discussed and let me know if I can assist you in the future.   These are the goals we discussed:  Goals       Increase physical activity      Starting 07/12/2018, I will continue to exercise for 90-120 minutes 3 days per week.       Patient Stated (pt-stated)      Would like to continue increasing my exercise routine.        This is a list of the screening recommended for you and due dates:  Health Maintenance  Topic Date Due   COVID-19 Vaccine (5 - Pfizer risk series) 09/25/2022*   Zoster (Shingles) Vaccine (1 of 2) 12/10/2022*   Flu Shot  02/12/2023*   Tetanus Vaccine  09/10/2023*   Medicare Annual Wellness Visit  09/10/2023   Pneumonia Vaccine  Completed   Hepatitis C Screening: USPSTF Recommendation to screen - Ages 18-79 yo.  Completed   HPV Vaccine  Aged Out   Colon Cancer Screening  Discontinued  *Topic was postponed. The date shown is not the original due date.    Advanced directives: In chart  Conditions/risks identified: None  Next appointment: Follow up in one year for your annual wellness visit.    Preventive Care 68 Years and Older, Male  Preventive care refers to lifestyle choices and visits with your health care provider that can promote health and wellness. What does preventive care include? A yearly physical exam. This is also called an annual well check. Dental exams once or twice a year. Routine eye exams. Ask your health care provider how often you should have your eyes checked. Personal lifestyle choices, including: Daily care of your teeth and gums. Regular physical activity. Eating a healthy diet. Avoiding tobacco and drug use. Limiting alcohol use. Practicing safe sex. Taking low doses of aspirin every day. Taking vitamin and mineral supplements as recommended by  your health care provider. What happens during an annual well check? The services and screenings done by your health care provider during your annual well check will depend on your age, overall health, lifestyle risk factors, and family history of disease. Counseling  Your health care provider may ask you questions about your: Alcohol use. Tobacco use. Drug use. Emotional well-being. Home and relationship well-being. Sexual activity. Eating habits. History of falls. Memory and ability to understand (cognition). Work and work Statistician. Screening  You may have the following tests or measurements: Height, weight, and BMI. Blood pressure. Lipid and cholesterol levels. These may be checked every 5 years, or more frequently if you are over 57 years old. Skin check. Lung cancer screening. You may have this screening every year starting at age 58 if you have a 30-pack-year history of smoking and currently smoke or have quit within the past 15 years. Fecal occult blood test (FOBT) of the stool. You may have this test every year starting at age 42. Flexible sigmoidoscopy or colonoscopy. You may have a sigmoidoscopy every 5 years or a colonoscopy every 10 years starting at age 32. Prostate cancer screening. Recommendations will vary depending on your family history and other risks. Hepatitis C blood test. Hepatitis B blood test. Sexually transmitted disease (STD) testing. Diabetes screening. This is done by checking your blood sugar (glucose) after you have not eaten for a while (fasting). You may  have this done every 1-3 years. Abdominal aortic aneurysm (AAA) screening. You may need this if you are a current or former smoker. Osteoporosis. You may be screened starting at age 79 if you are at high risk. Talk with your health care provider about your test results, treatment options, and if necessary, the need for more tests. Vaccines  Your health care provider may recommend certain vaccines,  such as: Influenza vaccine. This is recommended every year. Tetanus, diphtheria, and acellular pertussis (Tdap, Td) vaccine. You may need a Td booster every 10 years. Zoster vaccine. You may need this after age 58. Pneumococcal 13-valent conjugate (PCV13) vaccine. One dose is recommended after age 79. Pneumococcal polysaccharide (PPSV23) vaccine. One dose is recommended after age 79. Talk to your health care provider about which screenings and vaccines you need and how often you need them. This information is not intended to replace advice given to you by your health care provider. Make sure you discuss any questions you have with your health care provider. Document Released: 11/27/2015 Document Revised: 07/20/2016 Document Reviewed: 09/01/2015 Elsevier Interactive Patient Education  2017 [place] Prevention in the Home Falls can cause injuries. They can happen to people of all ages. There are many things you can do to make your home safe and to help prevent falls. What can I do on the outside of my home? Regularly fix the edges of walkways and driveways and fix any cracks. Remove anything that might make you trip as you walk through a door, such as a raised step or threshold. Trim any bushes or trees on the path to your home. Use bright outdoor lighting. Clear any walking paths of anything that might make someone trip, such as rocks or tools. Regularly check to see if handrails are loose or broken. Make sure that both sides of any steps have handrails. Any raised decks and porches should have guardrails on the edges. Have any leaves, snow, or ice cleared regularly. Use sand or salt on walking paths during winter. Clean up any spills in your garage right away. This includes oil or grease spills. What can I do in the bathroom? Use night lights. Install grab bars by the toilet and in the tub and shower. Do not use towel bars as grab bars. Use non-skid mats or decals in the tub or  shower. If you need to sit down in the shower, use a plastic, non-slip stool. Keep the floor dry. Clean up any water that spills on the floor as soon as it happens. Remove soap buildup in the tub or shower regularly. Attach bath mats securely with double-sided non-slip rug tape. Do not have throw rugs and other things on the floor that can make you trip. What can I do in the bedroom? Use night lights. Make sure that you have a light by your bed that is easy to reach. Do not use any sheets or blankets that are too big for your bed. They should not hang down onto the floor. Have a firm chair that has side arms. You can use this for support while you get dressed. Do not have throw rugs and other things on the floor that can make you trip. What can I do in the kitchen? Clean up any spills right away. Avoid walking on wet floors. Keep items that you use a lot in easy-to-reach places. If you need to reach something above you, use a strong step stool that has a grab bar. Keep electrical cords  out of the way. Do not use floor polish or wax that makes floors slippery. If you must use wax, use non-skid floor wax. Do not have throw rugs and other things on the floor that can make you trip. What can I do with my stairs? Do not leave any items on the stairs. Make sure that there are handrails on both sides of the stairs and use them. Fix handrails that are broken or loose. Make sure that handrails are as long as the stairways. Check any carpeting to make sure that it is firmly attached to the stairs. Fix any carpet that is loose or worn. Avoid having throw rugs at the top or bottom of the stairs. If you do have throw rugs, attach them to the floor with carpet tape. Make sure that you have a light switch at the top of the stairs and the bottom of the stairs. If you do not have them, ask someone to add them for you. What else can I do to help prevent falls? Wear shoes that: Do not have high heels. Have  rubber bottoms. Are comfortable and fit you well. Are closed at the toe. Do not wear sandals. If you use a stepladder: Make sure that it is fully opened. Do not climb a closed stepladder. Make sure that both sides of the stepladder are locked into place. Ask someone to hold it for you, if possible. Clearly mark and make sure that you can see: Any grab bars or handrails. First and last steps. Where the edge of each step is. Use tools that help you move around (mobility aids) if they are needed. These include: Canes. Walkers. Scooters. Crutches. Turn on the lights when you go into a dark area. Replace any light bulbs as soon as they burn out. Set up your furniture so you have a clear path. Avoid moving your furniture around. If any of your floors are uneven, fix them. If there are any pets around you, be aware of where they are. Review your medicines with your doctor. Some medicines can make you feel dizzy. This can increase your chance of falling. Ask your doctor what other things that you can do to help prevent falls. This information is not intended to replace advice given to you by your health care provider. Make sure you discuss any questions you have with your health care provider. Document Released: 08/27/2009 Document Revised: 04/07/2016 Document Reviewed: 12/05/2014 Elsevier Interactive Patient Education  2017 [employer].

## 2022-09-20 ENCOUNTER — Ambulatory Visit (INDEPENDENT_AMBULATORY_CARE_PROVIDER_SITE_OTHER): Payer: Medicare Other | Admitting: Family Medicine

## 2022-09-20 ENCOUNTER — Encounter: Payer: Self-pay | Admitting: Family Medicine

## 2022-09-20 ENCOUNTER — Ambulatory Visit (INDEPENDENT_AMBULATORY_CARE_PROVIDER_SITE_OTHER)
Admission: RE | Admit: 2022-09-20 | Discharge: 2022-09-20 | Disposition: A | Discharge status: Home / Self Care | Payer: Medicare Other | Source: Ambulatory Visit | Attending: Family Medicine | Admitting: Family Medicine

## 2022-09-20 VITALS — BP 134/80 | HR 60 | Temp 97.8°F | Ht 71.0 in | Wt 232.0 lb

## 2022-09-20 DIAGNOSIS — E538 Deficiency of other specified B group vitamins: Secondary | ICD-10-CM | POA: Diagnosis not present

## 2022-09-20 DIAGNOSIS — G8929 Other chronic pain: Secondary | ICD-10-CM

## 2022-09-20 DIAGNOSIS — E785 Hyperlipidemia, unspecified: Secondary | ICD-10-CM

## 2022-09-20 DIAGNOSIS — S0572XS Avulsion of left eye, sequela: Secondary | ICD-10-CM

## 2022-09-20 DIAGNOSIS — Z7189 Other specified counseling: Secondary | ICD-10-CM | POA: Diagnosis not present

## 2022-09-20 DIAGNOSIS — I77811 Abdominal aortic ectasia: Secondary | ICD-10-CM | POA: Diagnosis not present

## 2022-09-20 DIAGNOSIS — M25511 Pain in right shoulder: Secondary | ICD-10-CM

## 2022-09-20 DIAGNOSIS — Z789 Other specified health status: Secondary | ICD-10-CM

## 2022-09-20 LAB — COMPREHENSIVE METABOLIC PANEL
ALT: 23 U/L (ref 0–53)
AST: 24 U/L (ref 0–37)
Albumin: 4.6 g/dL (ref 3.5–5.2)
Alkaline Phosphatase: 73 U/L (ref 39–117)
BUN: 14 mg/dL (ref 6–23)
CO2: 28 mEq/L (ref 19–32)
Calcium: 9.6 mg/dL (ref 8.4–10.5)
Chloride: 103 mEq/L (ref 96–112)
Creatinine, Ser: 0.94 mg/dL (ref 0.40–1.50)
GFR: 77.17 mL/min (ref >60.00)
Glucose, Bld: 95 mg/dL (ref 70–99)
Potassium: 4.4 mEq/L (ref 3.5–5.1)
Sodium: 139 mEq/L (ref 135–145)
Total Bilirubin: 1 mg/dL (ref 0.2–1.2)
Total Protein: 7.5 g/dL (ref 6.0–8.3)

## 2022-09-20 LAB — LIPID PANEL
Cholesterol: 236 mg/dL — ABNORMAL HIGH (ref 0–200)
HDL: 89.7 mg/dL (ref >39.00)
LDL Cholesterol: 132 mg/dL — ABNORMAL HIGH (ref 0–99)
NonHDL: 146.63
Total CHOL/HDL Ratio: 3
Triglycerides: 74 mg/dL (ref 0.0–149.0)
VLDL: 14.8 mg/dL (ref 0.0–40.0)

## 2022-09-20 LAB — CBC WITH DIFFERENTIAL/PLATELET
Basophils Absolute: 0.1 10*3/uL (ref 0.0–0.1)
Basophils Relative: 1.3 % (ref 0.0–3.0)
Eosinophils Absolute: 0.2 10*3/uL (ref 0.0–0.7)
Eosinophils Relative: 3.4 % (ref 0.0–5.0)
HCT: 46.7 % (ref 39.0–52.0)
Hemoglobin: 15.7 g/dL (ref 13.0–17.0)
Lymphocytes Relative: 29.2 % (ref 12.0–46.0)
Lymphs Abs: 2 10*3/uL (ref 0.7–4.0)
MCHC: 33.7 g/dL (ref 30.0–36.0)
MCV: 101.9 fl — ABNORMAL HIGH (ref 78.0–100.0)
Monocytes Absolute: 0.6 10*3/uL (ref 0.1–1.0)
Monocytes Relative: 9.5 % (ref 3.0–12.0)
Neutro Abs: 3.8 10*3/uL (ref 1.4–7.7)
Neutrophils Relative %: 56.6 % (ref 43.0–77.0)
Platelets: 245 10*3/uL (ref 150.0–400.0)
RBC: 4.58 Mil/uL (ref 4.22–5.81)
RDW: 13.3 % (ref 11.5–15.5)
WBC: 6.7 10*3/uL (ref 4.0–10.5)

## 2022-09-20 LAB — VITAMIN B12: Vitamin B-12: >1500 pg/mL — ABNORMAL HIGH (ref 211–911)

## 2022-09-20 NOTE — Assessment & Plan Note (Signed)
Advanced directive discussion - scanned 09/2021. Wife Bryson Ha then daughter Mitchell Heir are HCPOA. No prolonged life support if terminal condition.

## 2022-09-20 NOTE — Patient Instructions (Addendum)
Send me dates of latest COVID shot as well as shingrix series.  Labs today  You are doing well today R shoulder xray today.  Return in 1 year for next medicare wellness visit and prior fasting for labs.   Health Maintenance After Age 79 After age 81, you are at a higher risk for certain long-term diseases and infections as well as injuries from falls. Falls are a major cause of broken bones and head injuries in people who are older than age 71. Getting regular preventive care can help to keep you healthy and well. Preventive care includes getting regular testing and making lifestyle changes as recommended by your health care provider. Talk with your health care provider about: Which screenings and tests you should have. A screening is a test that checks for a disease when you have no symptoms. A diet and exercise plan that is right for you. What should I know about screenings and tests to prevent falls? Screening and testing are the best ways to find a health problem early. Early diagnosis and treatment give you the best chance of managing medical conditions that are common after age 20. Certain conditions and lifestyle choices may make you more likely to have a fall. Your health care provider may recommend: Regular vision checks. Poor vision and conditions such as cataracts can make you more likely to have a fall. If you wear glasses, make sure to get your prescription updated if your vision changes. Medicine review. Work with your health care provider to regularly review all of the medicines you are taking, including over-the-counter medicines. Ask your health care provider about any side effects that may make you more likely to have a fall. Tell your health care provider if any medicines that you take make you feel dizzy or sleepy. Strength and balance checks. Your health care provider may recommend certain tests to check your strength and balance while standing, walking, or changing  positions. Foot health exam. Foot pain and numbness, as well as not wearing proper footwear, can make you more likely to have a fall. Screenings, including: Osteoporosis screening. Osteoporosis is a condition that causes the bones to get weaker and break more easily. Blood pressure screening. Blood pressure changes and medicines to control blood pressure can make you feel dizzy. Depression screening. You may be more likely to have a fall if you have a fear of falling, feel depressed, or feel unable to do activities that you used to do. Alcohol use screening. Using too much alcohol can affect your balance and may make you more likely to have a fall. Follow these instructions at home: Lifestyle Do not drink alcohol if: Your health care provider tells you not to drink. If you drink alcohol: Limit how much you have to: 0-1 drink a day for women. 0-2 drinks a day for men. Know how much alcohol is in your drink. In the [place] one drink equals one 12 oz bottle of beer (355 mL), one 5 oz glass of wine (148 mL), or one 1 oz glass of hard liquor (44 mL). Do not use any products that contain nicotine or tobacco. These products include cigarettes, chewing tobacco, and vaping devices, such as e-cigarettes. If you need help quitting, ask your health care provider. Activity  Follow a regular exercise program to stay fit. This will help you maintain your balance. Ask your health care provider what types of exercise are appropriate for you. If you need a cane or walker, use it as recommended  by your health care provider. Wear supportive shoes that have nonskid soles. Safety  Remove any tripping hazards, such as rugs, cords, and clutter. Install safety equipment such as grab bars in bathrooms and safety rails on stairs. Keep rooms and walkways well-lit. General instructions Talk with your health care provider about your risks for falling. Tell your health care provider if: You fall. Be sure to tell your  health care provider about all falls, even ones that seem minor. You feel dizzy, tiredness (fatigue), or off-balance. Take over-the-counter and prescription medicines only as told by your health care provider. These include supplements. Eat a healthy diet and maintain a healthy weight. A healthy diet includes low-fat dairy products, low-fat (lean) meats, and fiber from whole grains, beans, and lots of fruits and vegetables. Stay current with your vaccines. Schedule regular health, dental, and eye exams. Summary Having a healthy lifestyle and getting preventive care can help to protect your health and wellness after age 73. Screening and testing are the best way to find a health problem early and help you avoid having a fall. Early diagnosis and treatment give you the best chance for managing medical conditions that are more common for people who are older than age 65. Falls are a major cause of broken bones and head injuries in people who are older than age 78. Take precautions to prevent a fall at home. Work with your health care provider to learn what changes you can make to improve your health and wellness and to prevent falls. This information is not intended to replace advice given to you by your health care provider. Make sure you discuss any questions you have with your health care provider. Document Revised: 03/22/2021 Document Reviewed: 03/22/2021 Elsevier Patient Education  [employer].

## 2022-09-20 NOTE — Progress Notes (Unsigned)
Patient ID: Patrick Sawyer, male    DOB: [birth year], 81 y.o.   MRN: [id number]  This visit was conducted in person.  BP 134/80   Pulse 60   Temp 97.8 F (36.6 C) (Temporal)   Ht '5\' 11"'$  (1.803 m)   Wt 232 lb (105.2 kg)   SpO2 98%   BMI 32.36 kg/m    CC: CPE Subjective:   HPI: Patrick Sawyer is a 81 y.o. male presenting on 09/20/2022 for Annual Exam (MCR prt 2. )   Saw health advisor last week for medicare wellness visit. Note reviewed.    No results found.  Flowsheet Row Clinical Support from 09/09/2022 in [facility]  PHQ-2 Total Score 0          09/09/2022    9:10 AM 09/07/2021    9:13 AM 07/28/2020    8:24 AM 07/26/2019    9:33 AM 07/12/2018    9:13 AM  Fall Risk   Falls in the past year? 0 0 0 0 No  Number falls in past yr: 0 0     Injury with Fall? 0 0     Risk for fall due to : No Fall Risks No Fall Risks     Follow up Falls prevention discussed Falls prevention discussed      COVID infection 08/2022 s/p ER evaluation treated with paxlovid. Symptoms fully resolved. Asks about RSV vaccine.   L shoulder pain 03/2022 after he woke up from nap. No inciting trauma/injury or falls. Notes ongoing discomfort to R shoulder but still has some residual discomfort as well as limited ROM due to pain.   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 Not eligible.  Flu shot yearly  COVID vaccine - 12/2019, 01/2020, booster 08/2020, bivalent booster 07/2021, again 03/2022 Td 2009 Pneumovax 06/2016, prevnar 2016 Zostavax 2007 Shingrix - completed will send me dates Advanced directive discussion - scanned 09/2021. Wife Patrick Sawyer then daughter Patrick Sawyer are HCPOA. No prolonged life support if terminal condition.  Seat belt use discussed  Sunscreen use  discussed. No changing moles. Sees derm yearly (Dr Patrick Sawyer).  Quit smoking 1991  Alcohol - drinking 2 shots bourbon and 2 glasses of wine/night. Bourbon helps sleep. Gives up alcohol for lent.  Dentist Q6 mo  Eye exam - yearly Bowel - no diarrhea/constipation.  Bladder - no incontinence.    Lives with wife Occupation: retired, was Optometrist Edu: BS accounting Activity: exercising at home with free weights  Diet: good water, fruits/vegetables daily      Relevant past medical, surgical, family and social history reviewed and updated as indicated. Interim medical history since our last visit reviewed. Allergies and medications reviewed and updated. Outpatient Medications Prior to Visit  Medication Sig Dispense Refill   aspirin 81 MG EC tablet Take 81 mg by mouth daily. Swallow whole.     Cyanocobalamin (VITAMIN B-12 PO) Take 1,000 mcg by mouth daily.     EPINEPHrine (EPIPEN 2-PAK) 0.3 mg/0.3 mL IJ SOAJ injection Inject 0.3 mg into the muscle as needed for anaphylaxis. 1 each 1   glucosamine-chondroitin 500-400 MG tablet Take 2 tablets by mouth daily.     ketoconazole (NIZORAL) 2 % cream Apply to the feet and between the toes QHS. 60 g 6   Multiple Vitamins-Minerals (MULTIVITAMIN ADULT  PO) Take 1 capsule by mouth daily.     Omega-3 Fatty Acids (FISH OIL) 1000 MG CAPS Take 1 capsule by mouth daily.     No facility-administered medications prior to visit.     Per HPI unless specifically indicated in ROS section below Review of Systems  Objective:  BP 134/80   Pulse 60   Temp 97.8 F (36.6 C) (Temporal)   Ht '5\' 11"'$  (1.803 m)   Wt 232 lb (105.2 kg)   SpO2 98%   BMI 32.36 kg/m   Wt Readings from Last 3 Encounters:  09/20/22 232 lb (105.2 kg)  09/09/22 232 lb (105.2 kg)  08/14/22 232 lb 2.3 oz (105.3 kg)      Physical Exam Vitals and nursing note reviewed.  Constitutional:      General: He is not in acute distress.    Appearance: Normal appearance. He is well-developed.  He is not ill-appearing.  HENT:     Head: Normocephalic and atraumatic.     Right Ear: Hearing, tympanic membrane, ear canal and external ear normal.     Left Ear: Hearing, tympanic membrane, ear canal and external ear normal.     Mouth/Throat:     Mouth: Mucous membranes are moist.     Pharynx: Oropharynx is clear. No oropharyngeal exudate or posterior oropharyngeal erythema.  Eyes:     General: No scleral icterus.    Extraocular Movements: Extraocular movements intact.     Conjunctiva/sclera: Conjunctivae normal.     Pupils: Pupils are equal, round, and reactive to light.  Neck:     Thyroid: No thyroid mass or thyromegaly.     Vascular: No carotid bruit.  Cardiovascular:     Rate and Rhythm: Normal rate and regular rhythm.     Pulses: Normal pulses.          Radial pulses are 2+ on the right side and 2+ on the left side.     Heart sounds: Normal heart sounds. No murmur heard. Pulmonary:     Effort: Pulmonary effort is normal. No respiratory distress.     Breath sounds: Normal breath sounds. No wheezing, rhonchi or rales.  Abdominal:     General: Bowel sounds are normal. There is no distension.     Palpations: Abdomen is soft. There is no mass.     Tenderness: There is no abdominal tenderness. There is no guarding or rebound.     Hernia: No hernia is present.  Musculoskeletal:        General: Normal range of motion.     Cervical back: Normal range of motion and neck supple.     Right lower leg: No edema.     Left lower leg: No edema.  Lymphadenopathy:     Cervical: No cervical adenopathy.  Skin:    General: Skin is warm and dry.     Findings: No rash.  Neurological:     General: No focal deficit present.     Mental Status: He is alert and oriented to person, place, and time.  Psychiatric:        Mood and Affect: Mood normal.        Behavior: Behavior normal.        Thought Content: Thought content normal.        Judgment: Judgment normal.       Results for orders  placed or performed in visit on 09/15/21  Pathologist smear review  Result Value Ref Range   Path Review  CBC with Differential/Platelet  Result Value Ref Range   WBC 7.6 4.0 - 10.5 K/uL   RBC 4.50 4.22 - 5.81 Mil/uL   Hemoglobin 15.4 13.0 - 17.0 g/dL   HCT 46.3 39.0 - 52.0 %   MCV 102.9 (H) 78.0 - 100.0 fl   MCHC 33.3 30.0 - 36.0 g/dL   RDW 12.8 11.5 - 15.5 %   Platelets 248.0 150.0 - 400.0 K/uL   Neutrophils Relative % 57.5 43.0 - 77.0 %   Lymphocytes Relative 29.0 12.0 - 46.0 %   Monocytes Relative 9.5 3.0 - 12.0 %   Eosinophils Relative 3.0 0.0 - 5.0 %   Basophils Relative 1.0 0.0 - 3.0 %   Neutro Abs 4.4 1.4 - 7.7 K/uL   Lymphs Abs 2.2 0.7 - 4.0 K/uL   Monocytes Absolute 0.7 0.1 - 1.0 K/uL   Eosinophils Absolute 0.2 0.0 - 0.7 K/uL   Basophils Absolute 0.1 0.0 - 0.1 K/uL  Vitamin B12  Result Value Ref Range   Vitamin B-12 766 211 - 911 pg/mL  Hepatitis C antibody  Result Value Ref Range   Hepatitis C Ab NON-REACTIVE NON-REACTIVE   SIGNAL TO CUT-OFF 0.06 <1.00  Comprehensive metabolic panel  Result Value Ref Range   Sodium 139 135 - 145 mEq/L   Potassium 4.4 3.5 - 5.1 mEq/L   Chloride 103 96 - 112 mEq/L   CO2 28 19 - 32 mEq/L   Glucose, Bld 93 70 - 99 mg/dL   BUN 16 6 - 23 mg/dL   Creatinine, Ser 0.98 0.40 - 1.50 mg/dL   Total Bilirubin 0.9 0.2 - 1.2 mg/dL   Alkaline Phosphatase 61 39 - 117 U/L   AST 29 0 - 37 U/L   ALT 34 0 - 53 U/L   Total Protein 7.2 6.0 - 8.3 g/dL   Albumin 4.6 3.5 - 5.2 g/dL   GFR 73.93 >60.00 mL/min   Calcium 9.7 8.4 - 10.5 mg/dL  Lipid panel  Result Value Ref Range   Cholesterol 219 (H) 0 - 200 mg/dL   Triglycerides 53.0 0.0 - 149.0 mg/dL   HDL 101.90 >39.00 mg/dL   VLDL 10.6 0.0 - 40.0 mg/dL   LDL Cholesterol 106 (H) 0 - 99 mg/dL   Total CHOL/HDL Ratio 2    NonHDL 117.03     Assessment & Plan:   Problem List Items Addressed This Visit   None    No orders of the defined types were placed in this encounter.  No orders of  the defined types were placed in this encounter.    Patient instructions: Send me dates of latest COVID shot as well as shingrix series.  Labs today  You are doing well today R shoulder xray today.  Return in 1 year for next medicare wellness visit and prior fasting for labs.   Follow up plan: No follow-ups on file.  Ria Bush, MD

## 2022-09-21 ENCOUNTER — Other Ambulatory Visit: Payer: Self-pay | Admitting: Family Medicine

## 2022-09-21 MED ORDER — VITAMIN B-12 1000 MCG PO TABS: 1000.0000 ug | ORAL_TABLET | ORAL | Status: DC

## 2022-09-21 NOTE — Assessment & Plan Note (Addendum)
Chronic only on fish oil.  Update lipid panel off statin.  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 The 10-year ASCVD risk score (Arnett DK, et al., 2019) is: 30.1%   Values used to calculate the score:     Age: 81 years     Sex: Male     Is Non-Hispanic African American: No     Diabetic: No     Tobacco smoker: No     Systolic Blood Pressure: 470 mmHg     Is BP treated: No     HDL Cholesterol: 89.7 mg/dL     Total Cholesterol: 236 mg/dL

## 2022-09-21 NOTE — Assessment & Plan Note (Addendum)
Reviewed latest studies - vascular aortic US 2021 - no aneurysm, largest diameter 2.5cm, consider repeating in 10 yrs, sooner if needed.

## 2022-09-21 NOTE — Assessment & Plan Note (Signed)
Right shoulder pain present for the past 6 months, no inciting traumatic injury.  Exam with significant crepitus, discomfort and range of motion limitation, as well as evidence of biceps and supraspinatus tendinopathy.  Anticipate component of frozen shoulder.  Check x-rays given chronicity of symptoms, consider referral to sports medicine/Ortho.

## 2022-09-21 NOTE — Assessment & Plan Note (Signed)
Discussed alcohol use.  He stops alcohol during lent.

## 2022-09-21 NOTE — Assessment & Plan Note (Signed)
Update levels on 1000 mcg replacement daily over-the-counter

## 2022-09-25 ENCOUNTER — Encounter: Payer: Self-pay | Admitting: Family Medicine

## 2022-09-26 NOTE — Telephone Encounter (Signed)
Replied via result note.

## 2022-10-23 NOTE — Progress Notes (Unsigned)
    Patrick Barrero T. Codi Kertz, MD, [place] [facility] [place], [address]  Phone: [phone]  FAX: [phone]  Patrick Sawyer - 81 y.o. male  MRN [id number]  Date of Birth: 07-22-1941  Date: 10/24/2022  PCP: Ria Bush, MD  Referral: Ria Bush, MD  No chief complaint on file.  Subjective:   Patrick Sawyer is a 81 y.o. very pleasant male patient with There is no height or weight on file to calculate BMI. who presents with the following:  81 year old patient presents with R shoulder pain:   Patient presents with right shoulder pain.  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    Review of Systems is noted in the HPI, as appropriate  Objective:   There were no vitals taken for this visit.  GEN: No acute distress; alert,appropriate. PULM: Breathing comfortably in no respiratory distress PSYCH: Normally interactive.   Laboratory and Imaging Data:  Assessment and Plan:   ***

## 2022-10-24 ENCOUNTER — Ambulatory Visit (INDEPENDENT_AMBULATORY_CARE_PROVIDER_SITE_OTHER): Payer: Medicare Other | Admitting: Family Medicine

## 2022-10-24 ENCOUNTER — Encounter: Payer: Self-pay | Admitting: Family Medicine

## 2022-10-24 VITALS — BP 140/70 | HR 61 | Temp 98.2°F | Ht 71.0 in | Wt 237.2 lb

## 2022-10-24 DIAGNOSIS — M7501 Adhesive capsulitis of right shoulder: Secondary | ICD-10-CM | POA: Diagnosis not present

## 2022-10-24 MED ORDER — TRIAMCINOLONE ACETONIDE 40 MG/ML IJ SUSP
40.0000 mg | Freq: Once | INTRAMUSCULAR | Status: AC
  Administered 2022-10-24: 40 mg via INTRA_ARTICULAR

## 2022-10-24 NOTE — Addendum Note (Signed)
Addended by: Carter Kitten on: 10/24/2022 10:24 AM   Modules accepted: Orders

## 2022-11-08 ENCOUNTER — Ambulatory Visit: Payer: Medicare Other

## 2022-11-21 ENCOUNTER — Ambulatory Visit: Payer: Medicare Other | Attending: Family Medicine

## 2022-11-21 DIAGNOSIS — M25511 Pain in right shoulder: Secondary | ICD-10-CM | POA: Insufficient documentation

## 2022-11-21 DIAGNOSIS — M7501 Adhesive capsulitis of right shoulder: Secondary | ICD-10-CM | POA: Diagnosis not present

## 2022-11-21 DIAGNOSIS — M25611 Stiffness of right shoulder, not elsewhere classified: Secondary | ICD-10-CM | POA: Diagnosis not present

## 2022-11-21 DIAGNOSIS — G8929 Other chronic pain: Secondary | ICD-10-CM | POA: Diagnosis not present

## 2022-11-21 NOTE — Therapy (Signed)
[facility] [address], [place], [address] Phone: [phone]   Fax:  [phone]  Physical Therapy Evaluation  Patient Details  Name: Patrick Sawyer MRN: [id number] Date of Birth: [birth year] Referring Provider (PT): Owens Loffler, MD   Encounter Date: 11/21/2022   PT End of Session - 11/21/22 0846     Visit Number 1    Number of Visits 17    Date for PT Re-Evaluation 01/19/23    Progress Note Due on Visit 10    PT Start Time 1950    PT Stop Time 0941    PT Time Calculation (min) 54 min    Activity Tolerance Patient tolerated treatment well    Behavior During Therapy [facility] for tasks assessed/performed             Past Medical History:  Diagnosis Date   AAA (abdominal aortic aneurysm) ([place])    stable over last 3 yrs   Alcohol use longstanding   quits cold [place] for lent   Allergy    seasonal   Basal cell carcinoma 03/03/2014   left posterior ear, superficial    Cervical arthritis    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    Dysplastic nevus 07/24/2009   left thigh    Hearing loss of both ears    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, 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    Subjective Assessment - 11/21/22 0852     Subjective R shoulder: 0/10 currently (pt sitting and resting), 2/10 currently  with R shoulder flexion (superior shoulder area), 3/10 at most for the past 3 months    Pertinent History Adhesive capsulitis of right shoulder. Started about May 2023 and has improved substantially since then. Almost has full ROM now. Has not yet had PT for his shoulder. Was given a handout for different exercises (pendulums, standing R shoulder flexion at wall, behind the back R shoulder IR stretch).  No neck pain. Pt is R hand dominant.    Patient Stated Goals Be able to lift up and reach up more comfortably.    Currently in Pain? Yes    Pain Score 2     Pain Location Shoulder    Pain Orientation Right    Pain Descriptors / Indicators Dull;Pressure    Pain Type Chronic pain    Pain Radiating Towards none    Pain Onset More than a month ago    Pain Frequency Occasional    Aggravating Factors  Lifting items to a shelf, reaching, reaching down to pick up something from the floor (R shoulder flexion)    Pain Relieving Factors rest, not moving his R arm.  [facility] PT Assessment - 11/21/22 0850       Assessment   Medical Diagnosis M75.01 (ICD-10-CM) - Adhesive capsulitis of right shoulder    Referring Provider (PT) Owens Loffler, MD    Onset Date/Surgical Date 10/24/22   Date PT referral signed.   Hand Dominance Right      Precautions   Precaution Comments No known precautions      Restrictions   Other Position/Activity Restrictions No known restrictions      Balance Screen   Has the patient fallen in the past 6 months No    Has the patient had a decrease in activity level because of a fear of falling?  No    Is the patient reluctant to leave their home because of a fear of falling?  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 R lateral shift      AROM   Right Shoulder Flexion 112 Degrees   120 AAROM, stiff end feel; 125 degrees AROM in supine    Right Shoulder ABduction 116 Degrees   AAROM 130 degrees, stiff end feel   Right Shoulder Internal Rotation --   FIR: thumb to T8 Spinous process   Right Shoulder External Rotation --   FER (functional ER): R middle finger to L C6 tranverse process.   Left Shoulder Flexion 140 Degrees   AAROM 145 degrees, stiff end feel   Left Shoulder ABduction 164 Degrees   181 degrees  AAROM   Left Shoulder Internal Rotation --   FIR: L thumb to T7 spinous process   Left Shoulder External Rotation --   Functional ER (FER): L middle finger to R superior medial scapular angle.   Cervical Flexion full    Cervical Extension very limited with neck discomfort    Cervical - Right Side Bend very limited with L lateral neck strain    Cervical - Left Side Bend very limited with R lateral neck strain    Cervical - Right Rotation very limited with L lateral neck tighness and strain    Cervical - Left Rotation very limited with R lateral neck strain      Strength   Overall Strength Comments lower trap: 4/5 R, middle trap 4-/5 R    Right Shoulder Flexion 4/5    Right Shoulder ABduction 4/5    Right Shoulder Internal Rotation 4+/5    Right Shoulder External Rotation 4/5      Palpation   Palpation comment decreased inferior and posterior glenohumeral joint mobility. R teres major and and pectoralis major muscle tension                        Objective measurements completed on examination: See above findings.        No latex allergies No blood pressure problems per pt.   The L posterior ear basal cell CA was removed. 2015   Manual therapy  Supine inferior glide and posterior glide R GH joint with shoulder in abduction  Supine STM R teres major muscle with shoulder in flexion  Supine STM R pect major with arm in abduction   After manual therapy R shoulder flexion AROM: 121 degrees   Therapeutic Exercise Standing R pectoralis stretch 30 seconds x 3  Improved exercise technique,  movement at target joints, use of target muscles after mod verbal, visual,  tactile cues.          Response to treatment Improved R shoulder flexion AROM after manual therapy.    Clinical impression Pt is a 82 year old male who came to physical therapy secondary to R shoulder adhesive capsulitis. He also presents with altered posture, limited R shoulder flexion, ER, and IR AROM with stiff end feel, R shoulder and scapular weakness, decreased R shoulder joint mobility, R teres major and pectoralis major muscle tension, and difficulty with activities which involve reaching secondary to pain. Pt will benefit from skilled physical therapy services to address the aforementioned deficits.         Home Exercise program Access Code: [email or web address] URL: [email or web address] Date: 11/21/2022 Prepared by: Joneen Boers  Exercises - Single Arm Doorway Pec Stretch at 60 Elevation  - 3 x daily - 7 x weekly - 1 sets - 3 reps - 30 seconds hold                   PT Education - 11/21/22 1003     Education Details ther-ex, HEP, POC    Person(s) Educated Patient    Methods Explanation;Demonstration;Tactile cues;Verbal cues;Handout    Comprehension Verbalized understanding;Returned demonstration              PT Short Term Goals - 11/21/22 0953       PT SHORT TERM GOAL #1   Title Pt will be independent with his initial HEP to improve ROM, strength, and function.    Baseline Pt has started his HEP (11/21/2022)    Time 3    Period Weeks    Status New    Target Date 12/15/22               PT Long Term Goals - 11/21/22 0954       PT LONG TERM GOAL #1   Title Pt will have a decrease in R shoulder pain to 1/10 or less at worst to promote ability to reach more comfortably.    Baseline 3/10 R shoulder pain at worst for the past 3 months (11/21/2022)    Time 8    Period Weeks    Status New    Target Date 01/19/23      PT LONG TERM GOAL #2   Title Pt will  improve R shoulder flexion, AROM to 140 at least degrees, abduction to at least 160 degrees to promote ability to reach more comfortably.    Baseline R shoulder AROM 112 degrees flexion, 116 degrees abduction (11/21/2022)    Time 8    Period Weeks    Status New    Target Date 01/19/23      PT LONG TERM GOAL #3   Title Pt will improve his R shoulder strength by at least 1/2 MMT to promote ability to lift items to a shelf, reach, pick up something from the floor more comfortably.    Baseline R lower trap 4/5, middle trap 4-/5, flexion 4/5, abduction 4/5, ER 4/5, IR 4+/5 (11/21/2022)    Time 8    Period Weeks    Status New    Target Date 01/19/23      PT LONG TERM GOAL #4   Title Pt will improve his R shoulder FOTO score by at least 10 points as a demonstration of improved function.    Baseline R shoulder FOTO 60 (11/21/2022)    Time 8    Period Weeks    Status New    Target Date  01/19/23                    Plan - 11/21/22 1005     Clinical Impression Statement Pt is a 82 year old male who came to physical therapy secondary to R shoulder adhesive capsulitis. He also presents with altered posture, limited R shoulder flexion, ER, and IR AROM with stiff end feel, R shoulder and scapular weakness, decreased R shoulder joint mobility, R teres major and pectoralis major muscle tension, and difficulty with activities which involve reaching secondary to pain. Pt will benefit from skilled physical therapy services to address the aforementioned deficits.    Personal Factors and Comorbidities Age;Time since onset of injury/illness/exacerbation;Comorbidity 2    Comorbidities AAA, hx of basal cell CA    Examination-Activity Limitations Reach Overhead;Lift    Stability/Clinical Decision Making Stable/Uncomplicated    Clinical Decision Making Low    Rehab Potential Fair    PT Frequency 2x / week    PT Duration 8 weeks    PT Treatment/Interventions Therapeutic activities;Neuromuscular  re-education;Therapeutic exercise;Patient/family education;Manual techniques;Dry needling;Spinal Manipulations;Joint Manipulations;Electrical Stimulation;Iontophoresis '4mg'$ /ml Dexamethasone    PT Next Visit Plan Posture, scapular, shoulder strengthening, stretches, manual techniques, modalities PRN    PT Home Exercise Plan Medbridge Access Code: [email or web address]    Consulted and Agree with Plan of Care Patient             Patient will benefit from skilled therapeutic intervention in order to improve the following deficits and impairments:  Pain, Postural dysfunction, Improper body mechanics, Impaired UE functional use, Decreased strength, Decreased range of motion  Visit Diagnosis: Chronic right shoulder pain - Plan: PT plan of care cert/re-cert  Stiffness of right shoulder joint - Plan: PT plan of care cert/re-cert     Problem List Patient Active Problem List   Diagnosis Date Noted   Chronic right shoulder pain 09/20/2022   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, [place], [address] Phone: [phone]   Fax:  [phone]  Name: Patrick Sawyer MRN: [id number] Date of Birth: 1941/06/15

## 2022-11-23 ENCOUNTER — Ambulatory Visit: Payer: Medicare Other

## 2022-11-23 DIAGNOSIS — M7501 Adhesive capsulitis of right shoulder: Secondary | ICD-10-CM | POA: Diagnosis not present

## 2022-11-23 DIAGNOSIS — M25511 Pain in right shoulder: Secondary | ICD-10-CM | POA: Diagnosis not present

## 2022-11-23 DIAGNOSIS — G8929 Other chronic pain: Secondary | ICD-10-CM

## 2022-11-23 DIAGNOSIS — M25611 Stiffness of right shoulder, not elsewhere classified: Secondary | ICD-10-CM | POA: Diagnosis not present

## 2022-11-23 NOTE — Therapy (Signed)
OUTPATIENT PHYSICAL THERAPY TREATMENT NOTE   Patient Name: Patrick Sawyer MRN: [id number] DOB:11/19/1940, 82 y.o., male Today's Date: 11/23/2022  PCP: Ria Bush, MD  REFERRING PROVIDER: Owens Loffler, MD    END OF SESSION:  PT End of Session - 11/23/22 0853     Visit Number 2    Number of Visits 17    Date for PT Re-Evaluation 01/19/23    Progress Note Due on Visit 10    PT Start Time [phone]   pt arrived late   PT Stop Time 0933    PT Time Calculation (min) 40 min    Activity Tolerance Patient tolerated treatment well    Behavior During Therapy [facility] for tasks assessed/performed             Past Medical History:  Diagnosis Date   AAA (abdominal aortic aneurysm) ([place])    stable over last 3 yrs   Alcohol use longstanding   quits cold [place] for lent   Allergy    seasonal   Basal cell carcinoma 03/03/2014   left posterior ear, superficial    Cervical arthritis    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    Dysplastic nevus 07/24/2009   left thigh    Hearing loss of both ears    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, 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   HLD (hyperlipidemia)    Obesity, Class I, BMI 30-34.9    Seasonal allergies    Traumatic enucleation of left eye    Past Surgical History:  Procedure Laterality Date   APPENDECTOMY  1959   COLONOSCOPY  01/2015   small polyps, rpt 5 yrs Tiffany Kocher)   COLONOSCOPY  06/2012   small polyps Vira Agar)   COLONOSCOPY  02/2020   1 HP, diverticulosis, int hem ([place])   ENUCLEATION Left 1969   HEMORRHOID BANDING  04/2015   Dr Carilyn Goodpasture HERNIA REPAIR Right    MENISCUS REPAIR Left 2005   [place]  04/2014   [place]  2006   Patient Active Problem List   Diagnosis Date Noted   Chronic right shoulder pain 09/20/2022   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   Skin lump of leg, left 06/14/2021   Varicose veins of both lower extremities without ulcer or inflammation 07/28/2020    Medicare annual wellness visit, subsequent 07/26/2019   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 Started about May 2023 and has improved substantially since then. Almost has full ROM now. Has not yet had PT for his shoulder. Was given a handout for different exercises (pendulums, standing R shoulder flexion at wall, behind the back R shoulder IR stretch). No neck pain. Pt is R hand dominant.   PRECAUTIONS: No known precautions   SUBJECTIVE:   SUBJECTIVE STATEMENT: R shoulder is doing good. No pain currently. Tightness with R shoulder flexion, no pain.  PAIN:  Are you having pain? See subjective.  TODAY'S TREATMENT:                                                                                                                                         DATE: 11/23/2022  No latex allergies No blood pressure problems per pt.    The L posterior ear basal cell CA was removed. 2015     Manual therapy Supine inferior glide and posterior glide R GH joint with shoulder in abduction   Supine STM R pect major with arm in abduction     Supine STM R teres major muscle with shoulder in flexion       Therapeutic Exercise  Seated R shoulder self inferior mob 10x3 with 5 second holds  Standing R shoulder IR isometrics, hand on abdomen 10x5 seconds for 3 sets to promote R subscapularis muscle strengthening.   Standing B shoulder ER red band  10x3  Standing chin tucks with B scapular retraction to promote thoracic extension and posterior R scapular tipping with R shoulder flexion 10x5 seconds for 2 sets    Improved exercise technique, movement at target joints, use of target muscles after mod verbal, visual, tactile cues.           Response to treatment Pt tolerated session well without aggravation of symptoms.      Clinical impression  Worked on improving inferior joint mobilization, thoracic extension as well as decreasing teres major and pectoralis muscle tension to improve ability to raise his R arm up more comfortably. Pt tolerated session well without aggravation of symptoms. Pt will benefit from continued skilled physical therapy services to decreased pain, improve ROM, strength and function.       PATIENT EDUCATION: Education details: there-ex, HEP Person educated: Patient Education method: Explanation, Demonstration, Tactile cues, Verbal cues, and Handouts Education comprehension: verbalized understanding and returned demonstration  HOME EXERCISE PROGRAM: Home Exercise program Access Code: [email or web address] URL: [email or web address] Date: 11/21/2022 Prepared by: Joneen Boers   Exercises - Single Arm Doorway Pec Stretch at 60 Elevation  - 3 x daily - 7 x weekly - 1 sets - 3 reps - 30 seconds hold - Seated Shoulder Inferior Glide  - 1 x daily - 7 x weekly - 3 sets - 10 reps - 5 seconds hold  - Isometric Shoulder Internal Rotation  - 1 x daily - 7 x weekly - 3 sets - 10 reps - 5 seconds hold - Shoulder External Rotation and Scapular Retraction with Resistance  - 1 x daily - 7 x weekly - 3 sets - 10 reps - Seated Passive Cervical Retraction  - 1 x daily - 7 x weekly - 3 sets - 10 reps - 5 seconds hold   PT Short Term Goals - 11/21/22 0953       PT SHORT TERM GOAL #1   Title Pt will be independent with his initial HEP to improve ROM, strength, and  function.    Baseline Pt has started his HEP  (11/21/2022)    Time 3    Period Weeks    Status New    Target Date 12/15/22              PT Long Term Goals - 11/21/22 0954       PT LONG TERM GOAL #1   Title Pt will have a decrease in R shoulder pain to 1/10 or less at worst to promote ability to reach more comfortably.    Baseline 3/10 R shoulder pain at worst for the past 3 months (11/21/2022)    Time 8    Period Weeks    Status New    Target Date 01/19/23      PT LONG TERM GOAL #2   Title Pt will improve R shoulder flexion, AROM to 140 at least degrees, abduction to at least 160 degrees to promote ability to reach more comfortably.    Baseline R shoulder AROM 112 degrees flexion, 116 degrees abduction (11/21/2022)    Time 8    Period Weeks    Status New    Target Date 01/19/23      PT LONG TERM GOAL #3   Title Pt will improve his R shoulder strength by at least 1/2 MMT to promote ability to lift items to a shelf, reach, pick up something from the floor more comfortably.    Baseline R lower trap 4/5, middle trap 4-/5, flexion 4/5, abduction 4/5, ER 4/5, IR 4+/5 (11/21/2022)    Time 8    Period Weeks    Status New    Target Date 01/19/23      PT LONG TERM GOAL #4   Title Pt will improve his R shoulder FOTO score by at least 10 points as a demonstration of improved function.    Baseline R shoulder FOTO 60 (11/21/2022)    Time 8    Period Weeks    Status New    Target Date 01/19/23              Plan - 11/23/22 0852     Clinical Impression Statement Worked on improving inferior joint mobilization, thoracic extension as well as decreasing teres major and pectoralis muscle tension to improve ability to raise his R arm up more comfortably. Pt tolerated session well without aggravation of symptoms. Pt will benefit from continued skilled physical therapy services to decreased pain, improve ROM, strength and function.    Personal Factors and Comorbidities Age;Time since onset of injury/illness/exacerbation;Comorbidity 2     Comorbidities AAA, hx of basal cell CA    Examination-Activity Limitations Reach Overhead;Lift    Stability/Clinical Decision Making Stable/Uncomplicated    Clinical Decision Making Low    Rehab Potential Fair    PT Frequency 2x / week    PT Duration 8 weeks    PT Treatment/Interventions Therapeutic activities;Neuromuscular re-education;Therapeutic exercise;Patient/family education;Manual techniques;Dry needling;Spinal Manipulations;Joint Manipulations;Electrical Stimulation;Iontophoresis '4mg'$ /ml Dexamethasone    PT Next Visit Plan Posture, scapular, shoulder strengthening, stretches, manual techniques, modalities PRN    PT Home Exercise Plan Medbridge Access Code: [email or web address]    Consulted and Agree with Plan of Care Patient              Joneen Boers PT, DPT   11/23/2022, 9:50 AM

## 2022-11-30 ENCOUNTER — Ambulatory Visit: Payer: Medicare Other

## 2022-11-30 DIAGNOSIS — M25611 Stiffness of right shoulder, not elsewhere classified: Secondary | ICD-10-CM

## 2022-11-30 DIAGNOSIS — G8929 Other chronic pain: Secondary | ICD-10-CM | POA: Diagnosis not present

## 2022-11-30 DIAGNOSIS — M25511 Pain in right shoulder: Secondary | ICD-10-CM | POA: Diagnosis not present

## 2022-11-30 DIAGNOSIS — M7501 Adhesive capsulitis of right shoulder: Secondary | ICD-10-CM | POA: Diagnosis not present

## 2022-11-30 NOTE — Therapy (Signed)
OUTPATIENT PHYSICAL THERAPY TREATMENT NOTE   Patient Name: Patrick Sawyer MRN: [id number] DOB:Apr 05, 1941, 82 y.o., male Today's Date: 11/30/2022  PCP: Ria Bush, MD  REFERRING PROVIDER: Owens Loffler, MD    END OF SESSION:  PT End of Session - 11/30/22 0847     Visit Number 3    Number of Visits 17    Date for PT Re-Evaluation 01/19/23    Progress Note Due on Visit 10    PT Start Time 5009    PT Stop Time 0928    PT Time Calculation (min) 41 min    Activity Tolerance Patient tolerated treatment well    Behavior During Therapy [facility] for tasks assessed/performed              Past Medical History:  Diagnosis Date   AAA (abdominal aortic aneurysm) ([place])    stable over last 3 yrs   Alcohol use longstanding   quits cold [place] for lent   Allergy    seasonal   Basal cell carcinoma 03/03/2014   left posterior ear, superficial    Cervical arthritis    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    Dysplastic nevus 07/24/2009   left thigh    Hearing loss of both ears    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, 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   HLD (hyperlipidemia)    Obesity, Class I, BMI 30-34.9    Seasonal allergies    Traumatic enucleation of left eye    Past Surgical History:  Procedure Laterality Date   APPENDECTOMY  1959   COLONOSCOPY  01/2015   small polyps, rpt 5 yrs Tiffany Kocher)   COLONOSCOPY  06/2012   small polyps Vira Agar)   COLONOSCOPY  02/2020   1 HP, diverticulosis, int hem ([place])   ENUCLEATION Left 1969   HEMORRHOID BANDING  04/2015   Dr Carilyn Goodpasture HERNIA REPAIR Right    MENISCUS REPAIR Left 2005   [place]  04/2014   [place]  2006   Patient Active Problem List   Diagnosis Date Noted   Chronic right shoulder pain 09/20/2022   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   Skin lump of leg, left 06/14/2021   Varicose veins of both lower extremities without ulcer or inflammation 07/28/2020   Medicare  annual wellness visit, subsequent 07/26/2019   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 Started about May 2023 and has improved substantially since then. Almost has full ROM now. Has not yet had PT for his shoulder. Was given a handout for different exercises (pendulums, standing R shoulder flexion at wall, behind the back R shoulder IR stretch). No neck pain. Pt is R hand dominant.   PRECAUTIONS: No known precautions   SUBJECTIVE:   SUBJECTIVE STATEMENT: R shoulder feels good, no pain.   PAIN:  Are you having pain? See subjective.     TODAY'S TREATMENT:  DATE: 11/30/2022  No latex allergies No blood pressure problems per pt.    The L posterior ear basal cell CA was removed. 2015        Therapeutic Exercise  Standing R shoulder flexion AROM at start of session 116 degrees  Seated R shoulder self inferior mob 10x3 with 5 second holds  Standing doorway pectoralis stretch 30 seconds x 3  Supine chin tucks with B scapular retraction promote thoracic extension and posterior R scapular tipping with R shoulder flexion 10x5 seconds for 2 sets  Supine B shoulder scaption to promote thoracic extension 10x5 seconds for 2 sets  Seated R teres major muscle stretch, R arm on table in scapular plane (similar to  prone chest stretch on chair position)  30 seconds x 3    Improved exercise technique, movement at target joints, use of target muscles after mod verbal, visual, tactile cues.        Manual therapy Supine inferior glide and posterior glide R GH joint with shoulder in abduction   Supine STM R pect major with arm in abduction     Supine STM R teres major muscle with shoulder in flexion          Response to treatment Pt tolerated session well without aggravation of symptoms.  121 degrees R shoulder flexion AROM after session.         Clinical impression Continued working on improving inferior glenohumeral joint mobilily, thoracic extension as well as decreasing teres major and pectoralis muscle tension to improve ability to raise his R arm up more comfortably. Pt tolerated session well without aggravation of symptoms. Improved R shoulder flexion AROM after session. Pt will benefit from continued skilled physical therapy services to decreased pain, improve ROM, strength and function.       PATIENT EDUCATION: Education details: there-ex, HEP Person educated: Patient Education method: Explanation, Demonstration, Tactile cues, Verbal cues, and Handouts Education comprehension: verbalized understanding and returned demonstration  HOME EXERCISE PROGRAM: Home Exercise program Access Code: [email or web address] URL: [email or web address] Date: 11/21/2022 Prepared by: Joneen Boers   Exercises - Single Arm Doorway Pec Stretch at 60 Elevation  - 3 x daily - 7 x weekly - 1 sets - 3 reps - 30 seconds hold - Seated Shoulder Inferior Glide  - 1 x daily - 7 x weekly - 3 sets - 10 reps - 5 seconds hold  - Isometric Shoulder Internal Rotation  - 1 x daily - 7 x weekly - 3 sets - 10 reps - 5 seconds hold - Shoulder External Rotation and Scapular Retraction with Resistance  - 1 x daily - 7 x weekly - 3 sets - 10 reps - Seated Passive Cervical Retraction  - 1 x daily - 7 x weekly - 3  sets - 10 reps - 5 seconds hold   PT Short Term Goals - 11/21/22 0953       PT SHORT TERM GOAL #1   Title Pt will be independent with his initial HEP to improve ROM, strength, and function.    Baseline Pt has started his HEP (11/21/2022)    Time 3    Period Weeks    Status New    Target Date 12/15/22              PT Long Term Goals - 11/21/22 0954       PT LONG TERM GOAL #1   Title Pt will have a decrease in R shoulder pain to 1/10 or less at worst to  promote ability to reach more comfortably.    Baseline 3/10 R shoulder pain at worst for the past 3 months (11/21/2022)    Time 8    Period Weeks    Status New    Target Date 01/19/23      PT LONG TERM GOAL #2   Title Pt will improve R shoulder flexion, AROM to 140 at least degrees, abduction to at least 160 degrees to promote ability to reach more comfortably.    Baseline R shoulder AROM 112 degrees flexion, 116 degrees abduction (11/21/2022)    Time 8    Period Weeks    Status New    Target Date 01/19/23      PT LONG TERM GOAL #3   Title Pt will improve his R shoulder strength by at least 1/2 MMT to promote ability to lift items to a shelf, reach, pick up something from the floor more comfortably.    Baseline R lower trap 4/5, middle trap 4-/5, flexion 4/5, abduction 4/5, ER 4/5, IR 4+/5 (11/21/2022)    Time 8    Period Weeks    Status New    Target Date 01/19/23      PT LONG TERM GOAL #4   Title Pt will improve his R shoulder FOTO score by at least 10 points as a demonstration of improved function.    Baseline R shoulder FOTO 60 (11/21/2022)    Time 8    Period Weeks    Status New    Target Date 01/19/23              Plan - 11/30/22 0843     Clinical Impression Statement Continued working on improving inferior glenohumeral joint mobilily, thoracic extension as well as decreasing teres major and pectoralis muscle tension to improve ability to raise his R arm up more comfortably. Pt tolerated session well without  aggravation of symptoms. Improved R shoulder flexion AROM after session. Pt will benefit from continued skilled physical therapy services to decreased pain, improve ROM, strength and function.    Personal Factors and Comorbidities Age;Time since onset of injury/illness/exacerbation;Comorbidity 2    Comorbidities AAA, hx of basal cell CA    Examination-Activity Limitations Reach Overhead;Lift    Stability/Clinical Decision Making Stable/Uncomplicated    Clinical Decision Making Low    Rehab Potential Fair    PT Frequency 2x / week    PT Duration 8 weeks    PT Treatment/Interventions Therapeutic activities;Neuromuscular re-education;Therapeutic exercise;Patient/family education;Manual techniques;Dry needling;Spinal Manipulations;Joint Manipulations;Electrical Stimulation;Iontophoresis '4mg'$ /ml Dexamethasone    PT Next Visit Plan Posture, scapular, shoulder strengthening, stretches, manual techniques, modalities PRN    PT Home Exercise Plan Medbridge Access Code: [email or web address]    Consulted and Agree with Plan of Care Patient              Joneen Boers PT, DPT   11/30/2022, 9:35 AM

## 2022-12-06 ENCOUNTER — Ambulatory Visit: Payer: Medicare Other

## 2022-12-06 DIAGNOSIS — G8929 Other chronic pain: Secondary | ICD-10-CM

## 2022-12-06 DIAGNOSIS — M25611 Stiffness of right shoulder, not elsewhere classified: Secondary | ICD-10-CM

## 2022-12-06 DIAGNOSIS — M7501 Adhesive capsulitis of right shoulder: Secondary | ICD-10-CM | POA: Diagnosis not present

## 2022-12-06 DIAGNOSIS — M25511 Pain in right shoulder: Secondary | ICD-10-CM | POA: Diagnosis not present

## 2022-12-06 NOTE — Therapy (Signed)
OUTPATIENT PHYSICAL THERAPY TREATMENT NOTE   Patient Name: Patrick Sawyer MRN: [id number] DOB:1941-07-04, 82 y.o., male Today's Date: 12/06/2022  PCP: Ria Bush, MD  REFERRING PROVIDER: Owens Loffler, MD    END OF SESSION:  PT End of Session - 12/06/22 4098     Visit Number 4    Number of Visits 17    Date for PT Re-Evaluation 01/19/23    Progress Note Due on Visit 10    PT Start Time 0852    PT Stop Time 0932    PT Time Calculation (min) 40 min    Activity Tolerance Patient tolerated treatment well    Behavior During Therapy [facility] for tasks assessed/performed               Past Medical History:  Diagnosis Date   AAA (abdominal aortic aneurysm) ([place])    stable over last 3 yrs   Alcohol use longstanding   quits cold [place] for lent   Allergy    seasonal   Basal cell carcinoma 03/03/2014   left posterior ear, superficial    Cervical arthritis    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    Dysplastic nevus 07/24/2009   left thigh    Hearing loss of both ears    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, 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   HLD (hyperlipidemia)    Obesity, Class I, BMI 30-34.9    Seasonal allergies    Traumatic enucleation of left eye    Past Surgical History:  Procedure Laterality Date   APPENDECTOMY  1959   COLONOSCOPY  01/2015   small polyps, rpt 5 yrs Tiffany Kocher)   COLONOSCOPY  06/2012   small polyps Vira Agar)   COLONOSCOPY  02/2020   1 HP, diverticulosis, int hem ([place])   ENUCLEATION Left 1969   HEMORRHOID BANDING  04/2015   Dr Carilyn Goodpasture HERNIA REPAIR Right    MENISCUS REPAIR Left 2005   [place]  04/2014   [place]  2006   Patient Active Problem List   Diagnosis Date Noted   Chronic right shoulder pain 09/20/2022   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   Skin lump of leg, left 06/14/2021   Varicose veins of both lower extremities without ulcer or inflammation 07/28/2020   Medicare  annual wellness visit, subsequent 07/26/2019   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 Started about May 2023 and has improved substantially since then. Almost has full ROM now. Has not yet had PT for his shoulder. Was given a handout for different exercises (pendulums, standing R shoulder flexion at wall, behind the back R shoulder IR stretch). No neck pain. Pt is R hand dominant.   PRECAUTIONS: No known precautions   SUBJECTIVE:   SUBJECTIVE STATEMENT: R shoulder feels good, no pain.   PAIN:  Are you having pain? See subjective.     TODAY'S TREATMENT:  DATE: 12/06/2022  No latex allergies No blood pressure problems per pt.    The L posterior ear basal cell CA was removed. 2015     Manual therapy   L S/L R scapular mobilization   Supine inferior glide and posterior lateral glide R GH joint with shoulder in abduction grade 3 to 3+  Supine STM R pect major with arm in abduction    Supine STM R teres major muscle with shoulder in flexion to decrease tension and improve fascial mobility      Therapeutic Exercise  Standing R shoulder flexion AROM at start of session 122 degrees  Seated R shoulder self inferior mob 10x2 with 10 second holds  Hooklying B shoulder flexion 10x5 seconds to promote  thoracic extension   Hooklying R elbow extension isometrics in 90 degrees flexion, PT manual resistance 10x5 seconds for 3 sets  Hooklying R shoulder ER isometrics in 90 degrees flexion and 90 degree elbow flexion, PT manual resistance 10x5 seconds       Improved exercise technique, movement at target joints, use of target muscles after mod verbal, visual, tactile cues.           Response to treatment Pt tolerated session well without aggravation of symptoms.   126 degrees R shoulder flexion AROM after session.         Clinical impression Continued working on improving R shoulder joint mobiliy and ER and posterior shoulder muscle activation and thoracic extension to promote better glenohumeral mechanics when raising his arm. Pt improved to 126 degrees R shoulder flexion AROM after session. Pt will benefit from continued skilled physical therapy services to decreased pain, improve ROM, strength and function.       PATIENT EDUCATION: Education details: there-ex, HEP Person educated: Patient Education method: Explanation, Demonstration, Tactile cues, Verbal cues, and Handouts Education comprehension: verbalized understanding and returned demonstration  HOME EXERCISE PROGRAM: Home Exercise program Access Code: [email or web address] URL: [email or web address] Date: 11/21/2022 Prepared by: Joneen Boers   Exercises - Single Arm Doorway Pec Stretch at 60 Elevation  - 3 x daily - 7 x weekly - 1 sets - 3 reps - 30 seconds hold - Seated Shoulder Inferior Glide  - 1 x daily - 7 x weekly - 3 sets - 10 reps - 5 seconds hold  - Isometric Shoulder Internal Rotation  - 1 x daily - 7 x weekly - 3 sets - 10 reps - 5 seconds hold - Shoulder External Rotation and Scapular Retraction with Resistance  - 1 x daily - 7 x weekly - 3 sets - 10 reps - Seated Passive Cervical Retraction  - 1 x daily - 7 x weekly - 3 sets - 10 reps - 5 seconds hold   PT Short Term Goals - 11/21/22 0953        PT SHORT TERM GOAL #1   Title Pt will be independent with his initial HEP to improve ROM, strength, and function.    Baseline Pt has started his HEP (11/21/2022)    Time 3    Period Weeks    Status New    Target Date 12/15/22              PT Long Term Goals - 11/21/22 0954       PT LONG TERM GOAL #1   Title Pt will have a decrease in R shoulder pain to 1/10 or less at worst to promote ability to reach more comfortably.    Baseline 3/10 R shoulder pain  at worst for the past 3 months (11/21/2022)    Time 8    Period Weeks    Status New    Target Date 01/19/23      PT LONG TERM GOAL #2   Title Pt will improve R shoulder flexion, AROM to 140 at least degrees, abduction to at least 160 degrees to promote ability to reach more comfortably.    Baseline R shoulder AROM 112 degrees flexion, 116 degrees abduction (11/21/2022)    Time 8    Period Weeks    Status New    Target Date 01/19/23      PT LONG TERM GOAL #3   Title Pt will improve his R shoulder strength by at least 1/2 MMT to promote ability to lift items to a shelf, reach, pick up something from the floor more comfortably.    Baseline R lower trap 4/5, middle trap 4-/5, flexion 4/5, abduction 4/5, ER 4/5, IR 4+/5 (11/21/2022)    Time 8    Period Weeks    Status New    Target Date 01/19/23      PT LONG TERM GOAL #4   Title Pt will improve his R shoulder FOTO score by at least 10 points as a demonstration of improved function.    Baseline R shoulder FOTO 60 (11/21/2022)    Time 8    Period Weeks    Status New    Target Date 01/19/23              Plan - 12/06/22 0846     Clinical Impression Statement Continued working on improving R shoulder joint mobiliy and ER and posterior shoulder muscle activation and thoracic extension to promote better glenohumeral mechanics when raising his arm. Pt improved to 126 degrees R shoulder flexion AROM after session. Pt will benefit from continued skilled physical therapy services to  decreased pain, improve ROM, strength and function.    Personal Factors and Comorbidities Age;Time since onset of injury/illness/exacerbation;Comorbidity 2    Comorbidities AAA, hx of basal cell CA    Examination-Activity Limitations Reach Overhead;Lift    Stability/Clinical Decision Making Stable/Uncomplicated    Clinical Decision Making Low    Rehab Potential Fair    PT Frequency 2x / week    PT Duration 8 weeks    PT Treatment/Interventions Therapeutic activities;Neuromuscular re-education;Therapeutic exercise;Patient/family education;Manual techniques;Dry needling;Spinal Manipulations;Joint Manipulations;Electrical Stimulation;Iontophoresis '4mg'$ /ml Dexamethasone    PT Next Visit Plan Posture, scapular, shoulder strengthening, stretches, manual techniques, modalities PRN    PT Home Exercise Plan Medbridge Access Code: [email or web address]    Consulted and Agree with Plan of Care Patient              Joneen Boers PT, DPT   12/06/2022, 3:08 PM

## 2022-12-07 ENCOUNTER — Ambulatory Visit: Payer: Medicare Other

## 2022-12-08 ENCOUNTER — Ambulatory Visit: Payer: Medicare Other

## 2022-12-13 ENCOUNTER — Ambulatory Visit: Payer: Medicare Other

## 2022-12-13 DIAGNOSIS — M7501 Adhesive capsulitis of right shoulder: Secondary | ICD-10-CM | POA: Diagnosis not present

## 2022-12-13 DIAGNOSIS — M25611 Stiffness of right shoulder, not elsewhere classified: Secondary | ICD-10-CM

## 2022-12-13 DIAGNOSIS — G8929 Other chronic pain: Secondary | ICD-10-CM | POA: Diagnosis not present

## 2022-12-13 DIAGNOSIS — M25511 Pain in right shoulder: Secondary | ICD-10-CM | POA: Diagnosis not present

## 2022-12-13 NOTE — Therapy (Signed)
OUTPATIENT PHYSICAL THERAPY TREATMENT NOTE   Patient Name: Patrick Sawyer MRN: [id number] DOB:27-Sep-1941, 82 y.o., male Today's Date: 12/13/2022  PCP: Ria Bush, MD  REFERRING PROVIDER: Owens Loffler, MD    END OF SESSION:  PT End of Session - 12/13/22 4481     Visit Number 5    Number of Visits 17    Date for PT Re-Evaluation 01/19/23    Progress Note Due on Visit 10    PT Start Time 0852    PT Stop Time 0933    PT Time Calculation (min) 41 min    Activity Tolerance Patient tolerated treatment well    Behavior During Therapy [facility] for tasks assessed/performed                Past Medical History:  Diagnosis Date   AAA (abdominal aortic aneurysm) ([place])    stable over last 3 yrs   Alcohol use longstanding   quits cold [place] for lent   Allergy    seasonal   Basal cell carcinoma 03/03/2014   left posterior ear, superficial    Cervical arthritis    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    Dysplastic nevus 07/24/2009   left thigh    Hearing loss of both ears    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, 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   HLD (hyperlipidemia)    Obesity, Class I, BMI 30-34.9    Seasonal allergies    Traumatic enucleation of left eye    Past Surgical History:  Procedure Laterality Date   APPENDECTOMY  1959   COLONOSCOPY  01/2015   small polyps, rpt 5 yrs Tiffany Kocher)   COLONOSCOPY  06/2012   small polyps Vira Agar)   COLONOSCOPY  02/2020   1 HP, diverticulosis, int hem (Pyrtle)   ENUCLEATION Left 1969   HEMORRHOID BANDING  04/2015   Dr Carilyn Goodpasture HERNIA REPAIR Right    MENISCUS REPAIR Left 2005   [place]  04/2014   [place]  2006   Patient Active Problem List   Diagnosis Date Noted   Chronic right shoulder pain 09/20/2022   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   Skin lump of leg, left 06/14/2021   Varicose veins of both lower extremities without ulcer or inflammation 07/28/2020   Medicare  annual wellness visit, subsequent 07/26/2019   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 Started about May 2023 and has improved substantially since then. Almost has full ROM now. Has not yet had PT for his shoulder. Was given a handout for different exercises (pendulums, standing R shoulder flexion at wall, behind the back R shoulder IR stretch). No neck pain. Pt is R hand dominant.   PRECAUTIONS: No known precautions   SUBJECTIVE:   SUBJECTIVE STATEMENT: R shoulder feels good, no pain. Feels a little tightness when he reaches up.   PAIN:  Are you having pain? See subjective.  TODAY'S TREATMENT:                                                                                                                                         DATE: 12/13/2022  No latex allergies No blood pressure problems per pt.    The L posterior ear basal cell CA was removed. 2015      Therapeutic Exercise  Standing R shoulder flexion AROM at start of session 122 degrees  Standing R shoulder IR isometrics, hand on abdomen to target subscapularis muscle   10x3 with 5 second holds. Improved R shoulder flexion AROM to 128  degrees   Standing triceps extension blue band 10x5 seconds for 3 sets  Standing R shoulder ER green band with 5 lb ankle weight at distal arm 10x  Good posterior glide of R humeral head palpated during motion.    Improved R shoulder  flexion AROM to 131 degrees afterwards   Supine open books 10x5 seconds for 3 sets    Improved exercise technique, movement at target joints, use of target muscles after mod verbal, visual, tactile cues.           Response to treatment Pt tolerated session well without aggravation of symptoms.          Clinical impression Worked on exercises to promote posterior glide of R humeral head, improving thoracic extension, scapular retraction and posterior tipping as well as improving pectoralis flexibility to promote ability to raise his R arm up farther. Pt able to achieve 131 degrees R shoulder flexion AROM today after treatment. No complain of pain during session. Pt will benefit from continued skilled physical therapy services to decreased pain, improve ROM, strength and function.       PATIENT EDUCATION: Education details: there-ex, HEP Person educated: Patient Education method: Explanation, Demonstration, Tactile cues, Verbal cues, and Handouts Education comprehension: verbalized understanding and returned demonstration  HOME EXERCISE PROGRAM: Home Exercise program Access Code: [email or web address] URL: [email or web address] Date: 11/21/2022 Prepared by: Joneen Boers   Exercises - Single Arm Doorway Pec Stretch at 60 Elevation  - 3 x daily - 7 x weekly - 1 sets - 3 reps - 30 seconds hold - Seated Shoulder Inferior Glide  - 1 x daily - 7 x weekly - 3 sets - 10 reps - 5 seconds hold  - Isometric Shoulder Internal Rotation  - 1 x daily - 7 x weekly - 3 sets - 10 reps - 5 seconds hold - Shoulder External Rotation and Scapular Retraction with Resistance  - 1 x daily - 7 x weekly - 3 sets - 10 reps. (Red band) (upgraded to green band on 12/13/2022)   - Seated Passive Cervical Retraction  - 1 x daily - 7 x weekly - 3 sets - 10 reps - 5 seconds hold   PT Short Term Goals - 11/21/22 3382       PT  SHORT TERM GOAL #1   Title Pt will be independent with his initial HEP to  improve ROM, strength, and function.    Baseline Pt has started his HEP (11/21/2022)    Time 3    Period Weeks    Status New    Target Date 12/15/22              PT Long Term Goals - 11/21/22 0954       PT LONG TERM GOAL #1   Title Pt will have a decrease in R shoulder pain to 1/10 or less at worst to promote ability to reach more comfortably.    Baseline 3/10 R shoulder pain at worst for the past 3 months (11/21/2022)    Time 8    Period Weeks    Status New    Target Date 01/19/23      PT LONG TERM GOAL #2   Title Pt will improve R shoulder flexion, AROM to 140 at least degrees, abduction to at least 160 degrees to promote ability to reach more comfortably.    Baseline R shoulder AROM 112 degrees flexion, 116 degrees abduction (11/21/2022)    Time 8    Period Weeks    Status New    Target Date 01/19/23      PT LONG TERM GOAL #3   Title Pt will improve his R shoulder strength by at least 1/2 MMT to promote ability to lift items to a shelf, reach, pick up something from the floor more comfortably.    Baseline R lower trap 4/5, middle trap 4-/5, flexion 4/5, abduction 4/5, ER 4/5, IR 4+/5 (11/21/2022)    Time 8    Period Weeks    Status New    Target Date 01/19/23      PT LONG TERM GOAL #4   Title Pt will improve his R shoulder FOTO score by at least 10 points as a demonstration of improved function.    Baseline R shoulder FOTO 60 (11/21/2022)    Time 8    Period Weeks    Status New    Target Date 01/19/23              Plan - 12/13/22 2229     Clinical Impression Statement Worked on exercises to promote posterior glide of R humeral head, improving thoracic extension, scapular retraction and posterior tipping as well as improving pectoralis flexibility to promote ability to raise his R arm up farther. Pt able to achieve 131 degrees R shoulder flexion AROM today after treatment. No complain of pain during session. Pt will benefit from continued skilled physical therapy  services to decreased pain, improve ROM, strength and function.    Personal Factors and Comorbidities Age;Time since onset of injury/illness/exacerbation;Comorbidity 2    Comorbidities AAA, hx of basal cell CA    Examination-Activity Limitations Reach Overhead;Lift    Stability/Clinical Decision Making Stable/Uncomplicated    Clinical Decision Making Low    Rehab Potential Fair    PT Frequency 2x / week    PT Duration 8 weeks    PT Treatment/Interventions Therapeutic activities;Neuromuscular re-education;Therapeutic exercise;Patient/family education;Manual techniques;Dry needling;Spinal Manipulations;Joint Manipulations;Electrical Stimulation;Iontophoresis '4mg'$ /ml Dexamethasone    PT Next Visit Plan Posture, scapular, shoulder strengthening, stretches, manual techniques, modalities PRN    PT Home Exercise Plan Medbridge Access Code: [email or web address]    Consulted and Agree with Plan of Care Patient               Joneen Boers PT, DPT  12/13/2022, 9:53 AM

## 2022-12-15 ENCOUNTER — Ambulatory Visit: Payer: Medicare Other | Attending: Family Medicine

## 2022-12-15 DIAGNOSIS — G8929 Other chronic pain: Secondary | ICD-10-CM | POA: Insufficient documentation

## 2022-12-15 DIAGNOSIS — M25611 Stiffness of right shoulder, not elsewhere classified: Secondary | ICD-10-CM | POA: Diagnosis not present

## 2022-12-15 DIAGNOSIS — M25511 Pain in right shoulder: Secondary | ICD-10-CM | POA: Insufficient documentation

## 2022-12-15 NOTE — Therapy (Signed)
OUTPATIENT PHYSICAL THERAPY TREATMENT NOTE   Patient Name: Patrick Sawyer MRN: [id number] DOB:06-09-1941, 82 y.o., male Today's Date: 12/15/2022  PCP: Ria Bush, MD  REFERRING PROVIDER: Owens Loffler, MD    END OF SESSION:  PT End of Session - 12/15/22 0801     Visit Number 6    Number of Visits 17    Date for PT Re-Evaluation 01/19/23    Progress Note Due on Visit 10    PT Start Time 0801    PT Stop Time 6734    PT Time Calculation (min) 46 min    Activity Tolerance Patient tolerated treatment well    Behavior During Therapy [facility] for tasks assessed/performed                Past Medical History:  Diagnosis Date   AAA (abdominal aortic aneurysm) ([place])    stable over last 3 yrs   Alcohol use longstanding   quits cold [place] for lent   Allergy    seasonal   Basal cell carcinoma 03/03/2014   left posterior ear, superficial    Cervical arthritis    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    Dysplastic nevus 07/24/2009   left thigh    Hearing loss of both ears    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, 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   HLD (hyperlipidemia)    Obesity, Class I, BMI 30-34.9    Seasonal allergies    Traumatic enucleation of left eye    Past Surgical History:  Procedure Laterality Date   APPENDECTOMY  1959   COLONOSCOPY  01/2015   small polyps, rpt 5 yrs Tiffany Kocher)   COLONOSCOPY  06/2012   small polyps Vira Agar)   COLONOSCOPY  02/2020   1 HP, diverticulosis, int hem (Pyrtle)   ENUCLEATION Left 1969   HEMORRHOID BANDING  04/2015   Dr Carilyn Goodpasture HERNIA REPAIR Right    MENISCUS REPAIR Left 2005   [place]  04/2014   [place]  2006   Patient Active Problem List   Diagnosis Date Noted   Chronic right shoulder pain 09/20/2022   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   Skin lump of leg, left 06/14/2021   Varicose veins of both lower extremities without ulcer or inflammation 07/28/2020   Medicare  annual wellness visit, subsequent 07/26/2019   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 Started about May 2023 and has improved substantially since then. Almost has full ROM now. Has not yet had PT for his shoulder. Was given a handout for different exercises (pendulums, standing R shoulder flexion at wall, behind the back R shoulder IR stretch). No neck pain. Pt is R hand dominant.   PRECAUTIONS: No known precautions   SUBJECTIVE:   SUBJECTIVE STATEMENT: R shoulder feels good, no pain.   PAIN:  Are you having pain? See subjective.     TODAY'S TREATMENT:  DATE: 12/15/2022  No latex allergies No blood pressure problems per pt.    The L posterior ear basal cell CA was removed. 2015     Therapeutic Exercise  Standing R shoulder flexion AROM at start of session 130 degrees  Standing triceps extension blue band 10x5 seconds for 3 sets   Standing R shoulder IR blue band with 5 lb ankle weight at distal arm 10x5 second holds, then 5x5 seconds   Standing R shoulder ER green band with 5 lb ankle weight at distal arm 10x3                Good posterior glide of R humeral head palpated during motion.    Standing R shoulder extension green band 10x3   Standing R shoulder IR isometrics, hand on abdomen to  target subscapularis muscle                 10x3 with 5 second holds. Improved R shoulder flexion AROM to 128  degrees   Standing R shoulder flexion 5x with PT assist for scapular retraction and posterior tipping  Seated manually resisted R scapular retraction targeting the lower trap muscle 10x5 seconds for 2 sets  Seated manually resisted R triceps extension isometrics with R arm in slight flexion, and R elbow < 90 degrees flexion 10x5 seconds         Improved exercise technique, movement at target joints, use of target muscles after mod verbal, visual, tactile cues.           Response to treatment Pt tolerated session well without aggravation of symptoms.   R shoulder flexion AROM after session remained at 130 degrees         Clinical impression Improved starting R shoulder flexion AROM compared to previous sessions. Continued working on exercises to promote posterior glide of R humeral head, improving scapular retraction and posterior tipping to promote ability to raise his R arm up farther.  No complain of pain during session. Pt will benefit from continued skilled physical therapy services to decreased pain, improve ROM, strength and function.         PATIENT EDUCATION: Education details: there-ex, HEP Person educated: Patient Education method: Explanation, Demonstration, Tactile cues, Verbal cues, and Handouts Education comprehension: verbalized understanding and returned demonstration  HOME EXERCISE PROGRAM: Home Exercise program Access Code: [email or web address] URL: [email or web address] Date: 11/21/2022 Prepared by: Joneen Boers   Exercises - Single Arm Doorway Pec Stretch at 60 Elevation  - 3 x daily - 7 x weekly - 1 sets - 3 reps - 30 seconds hold - Seated Shoulder Inferior Glide  - 1 x daily - 7 x weekly - 3 sets - 10 reps - 5 seconds hold  - Isometric Shoulder Internal Rotation  - 1 x daily - 7 x weekly - 3 sets - 10 reps - 5 seconds hold -  Shoulder External Rotation and Scapular Retraction with Resistance  - 1 x daily - 7 x weekly - 3 sets - 10 reps - Seated Passive Cervical Retraction  - 1 x daily - 7 x weekly - 3 sets - 10 reps - 5 seconds hold   PT Short Term Goals - 11/21/22 0953       PT SHORT TERM GOAL #1   Title Pt will be independent with his initial HEP to improve ROM, strength, and function.    Baseline Pt has started his HEP (11/21/2022)    Time 3    Period Weeks  Status New    Target Date 12/15/22              PT Long Term Goals - 11/21/22 0954       PT LONG TERM GOAL #1   Title Pt will have a decrease in R shoulder pain to 1/10 or less at worst to promote ability to reach more comfortably.    Baseline 3/10 R shoulder pain at worst for the past 3 months (11/21/2022)    Time 8    Period Weeks    Status New    Target Date 01/19/23      PT LONG TERM GOAL #2   Title Pt will improve R shoulder flexion, AROM to 140 at least degrees, abduction to at least 160 degrees to promote ability to reach more comfortably.    Baseline R shoulder AROM 112 degrees flexion, 116 degrees abduction (11/21/2022)    Time 8    Period Weeks    Status New    Target Date 01/19/23      PT LONG TERM GOAL #3   Title Pt will improve his R shoulder strength by at least 1/2 MMT to promote ability to lift items to a shelf, reach, pick up something from the floor more comfortably.    Baseline R lower trap 4/5, middle trap 4-/5, flexion 4/5, abduction 4/5, ER 4/5, IR 4+/5 (11/21/2022)    Time 8    Period Weeks    Status New    Target Date 01/19/23      PT LONG TERM GOAL #4   Title Pt will improve his R shoulder FOTO score by at least 10 points as a demonstration of improved function.    Baseline R shoulder FOTO 60 (11/21/2022)    Time 8    Period Weeks    Status New    Target Date 01/19/23              Plan - 12/15/22 0757     Clinical Impression Statement Improved starting R shoulder flexion AROM compared to previous  sessions. Continued working on exercises to promote posterior glide of R humeral head, improving scapular retraction and posterior tipping to promote ability to raise his R arm up farther.  No complain of pain during session. Pt will benefit from continued skilled physical therapy services to decreased pain, improve ROM, strength and function.    Personal Factors and Comorbidities Age;Time since onset of injury/illness/exacerbation;Comorbidity 2    Comorbidities AAA, hx of basal cell CA    Examination-Activity Limitations Reach Overhead;Lift    Stability/Clinical Decision Making Stable/Uncomplicated    Rehab Potential Fair    PT Frequency 2x / week    PT Duration 8 weeks    PT Treatment/Interventions Therapeutic activities;Neuromuscular re-education;Therapeutic exercise;Patient/family education;Manual techniques;Dry needling;Spinal Manipulations;Joint Manipulations;Electrical Stimulation;Iontophoresis '4mg'$ /ml Dexamethasone    PT Next Visit Plan Posture, scapular, shoulder strengthening, stretches, manual techniques, modalities PRN    PT Home Exercise Plan Medbridge Access Code: [email or web address]    Consulted and Agree with Plan of Care Patient              Joneen Boers PT, DPT   12/15/2022, 9:06 AM

## 2022-12-20 ENCOUNTER — Ambulatory Visit: Payer: Medicare Other

## 2022-12-20 DIAGNOSIS — M25611 Stiffness of right shoulder, not elsewhere classified: Secondary | ICD-10-CM

## 2022-12-20 DIAGNOSIS — M25511 Pain in right shoulder: Secondary | ICD-10-CM | POA: Diagnosis not present

## 2022-12-20 DIAGNOSIS — G8929 Other chronic pain: Secondary | ICD-10-CM

## 2022-12-20 NOTE — Therapy (Signed)
OUTPATIENT PHYSICAL THERAPY TREATMENT NOTE   Patient Name: Patrick Sawyer MRN: [id number] DOB:June 27, 1941, 82 y.o., male Today's Date: 12/20/2022  PCP: Ria Bush, MD  REFERRING PROVIDER: Owens Loffler, MD    END OF SESSION:  PT End of Session - 12/20/22 0849     Visit Number 7    Number of Visits 17    Date for PT Re-Evaluation 01/19/23    Progress Note Due on Visit 10    PT Start Time 0849    PT Stop Time 0930    PT Time Calculation (min) 41 min    Activity Tolerance Patient tolerated treatment well    Behavior During Therapy [facility] for tasks assessed/performed                 Past Medical History:  Diagnosis Date   AAA (abdominal aortic aneurysm) ([place])    stable over last 3 yrs   Alcohol use longstanding   quits cold [place] for lent   Allergy    seasonal   Basal cell carcinoma 03/03/2014   left posterior ear, superficial    Cervical arthritis    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    Dysplastic nevus 07/24/2009   left thigh    Hearing loss of both ears    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, 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   HLD (hyperlipidemia)    Obesity, Class I, BMI 30-34.9    Seasonal allergies    Traumatic enucleation of left eye    Past Surgical History:  Procedure Laterality Date   APPENDECTOMY  1959   COLONOSCOPY  01/2015   small polyps, rpt 5 yrs Tiffany Kocher)   COLONOSCOPY  06/2012   small polyps Vira Agar)   COLONOSCOPY  02/2020   1 HP, diverticulosis, int hem (Pyrtle)   ENUCLEATION Left 1969   HEMORRHOID BANDING  04/2015   Dr Carilyn Goodpasture HERNIA REPAIR Right    MENISCUS REPAIR Left 2005   [place]  04/2014   [place]  2006   Patient Active Problem List   Diagnosis Date Noted   Chronic right shoulder pain 09/20/2022   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   Skin lump of leg, left 06/14/2021   Varicose veins of both lower extremities without ulcer or inflammation 07/28/2020   Medicare  annual wellness visit, subsequent 07/26/2019   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 Started about May 2023 and has improved substantially since then. Almost has full ROM now. Has not yet had PT for his shoulder. Was given a handout for different exercises (pendulums, standing R shoulder flexion at wall, behind the back R shoulder IR stretch). No neck pain. Pt is R hand dominant.   PRECAUTIONS: No known precautions   SUBJECTIVE:   SUBJECTIVE STATEMENT: R shoulder feels good, no pain currently. Feels something pinching on top of his shoulder when he picks something up from the ground  PAIN:  Are you having pain? See subjective.     TODAY'S TREATMENT:                                                                                                                                         DATE: 12/20/2022  No latex allergies No blood pressure problems per pt.    The L posterior ear basal cell CA was removed. 2015    Manual therapy   Supine inferior glide and posterior, inferior lateral glide R GH joint with shoulder in flexion, then abduction grade 3 to 3+   Supine STM R pect major with arm in abduction    Supine STM R teres major muscle with shoulder in flexion to decrease tension and improve fascial mobility   126 degrees R shoulder flexion AROM afterwards   Supine R shoulder flexion and abduction stretch PROM with PT 10x5 seconds  each   Seated STM R upper trap muscle to decrease tension    Therapeutic Exercise  Standing R shoulder flexion AROM at start of session 120 degrees  Supine with R shoulder in 90 degrees flexion  Manually resisted isometric triceps extension 10x5 seconds   Manually resisted isometric shoulder ER and triceps extension 10x5 seconds fpr 2 sets  To promote posterior and inferior glide    Seated manually resisted R shoulder extension from end range flexion 8x     Improved exercise technique, movement at target joints, use of target muscles after mod verbal, visual, tactile cues.           Response to treatment Pt tolerated session well without aggravation of symptoms.   R shoulder flexion AROM after session improved to 128 degrees.        Clinical impression  Demonstrates decreased inferior R glenohumeral joint mobility when arm is in 90 degrees abduction. Humeral head tends to glide superior anterior during R shoulder flexion. Worked on promoting posterior and inferior glide when raising his arm to help address. R shoulder flexion AROM improved to 128 degrees. No complain of pain during session. Pt will benefit from continued skilled physical therapy services to decreased pain, improve ROM, strength and function.         PATIENT EDUCATION: Education details: there-ex, HEP Person educated: Patient Education method: Explanation, Demonstration, Tactile cues, Verbal cues, and Handouts Education comprehension: verbalized understanding and returned demonstration  HOME EXERCISE PROGRAM: Home Exercise program Access Code: [email or web address] URL: [email or web address] Date: 11/21/2022 Prepared by: Joneen Boers   Exercises - Single Arm Doorway Pec Stretch at 60 Elevation  - 3 x daily - 7 x weekly - 1 sets - 3 reps - 30 seconds hold - Seated Shoulder Inferior Glide  - 1 x daily - 7 x weekly - 3 sets - 10 reps - 5 seconds hold  - Isometric Shoulder Internal Rotation  -  1 x daily - 7 x weekly - 3 sets - 10  reps - 5 seconds hold - Shoulder External Rotation and Scapular Retraction with Resistance  - 1 x daily - 7 x weekly - 3 sets - 10 reps - Seated Passive Cervical Retraction  - 1 x daily - 7 x weekly - 3 sets - 10 reps - 5 seconds hold   PT Short Term Goals - 11/21/22 0953       PT SHORT TERM GOAL #1   Title Pt will be independent with his initial HEP to improve ROM, strength, and function.    Baseline Pt has started his HEP (11/21/2022)    Time 3    Period Weeks    Status New    Target Date 12/15/22              PT Long Term Goals - 11/21/22 0954       PT LONG TERM GOAL #1   Title Pt will have a decrease in R shoulder pain to 1/10 or less at worst to promote ability to reach more comfortably.    Baseline 3/10 R shoulder pain at worst for the past 3 months (11/21/2022)    Time 8    Period Weeks    Status New    Target Date 01/19/23      PT LONG TERM GOAL #2   Title Pt will improve R shoulder flexion, AROM to 140 at least degrees, abduction to at least 160 degrees to promote ability to reach more comfortably.    Baseline R shoulder AROM 112 degrees flexion, 116 degrees abduction (11/21/2022)    Time 8    Period Weeks    Status New    Target Date 01/19/23      PT LONG TERM GOAL #3   Title Pt will improve his R shoulder strength by at least 1/2 MMT to promote ability to lift items to a shelf, reach, pick up something from the floor more comfortably.    Baseline R lower trap 4/5, middle trap 4-/5, flexion 4/5, abduction 4/5, ER 4/5, IR 4+/5 (11/21/2022)    Time 8    Period Weeks    Status New    Target Date 01/19/23      PT LONG TERM GOAL #4   Title Pt will improve his R shoulder FOTO score by at least 10 points as a demonstration of improved function.    Baseline R shoulder FOTO 60 (11/21/2022)    Time 8    Period Weeks    Status New    Target Date 01/19/23              Plan - 12/20/22 0843     Clinical Impression Statement  Demonstrates decreased inferior R glenohumeral joint mobility when arm is in 90 degrees abduction. Humeral head tends to glide superior anterior during R shoulder flexion. Worked on promoting posterior and inferior glide when raising his arm to help address. R shoulder flexion AROM improved to 128 degrees. No complain of pain during session. Pt will benefit from continued skilled physical therapy services to decreased pain, improve ROM, strength and function.    Personal Factors and Comorbidities Age;Time since onset of injury/illness/exacerbation;Comorbidity 2    Comorbidities AAA, hx of basal cell CA    Examination-Activity Limitations Reach Overhead;Lift    Stability/Clinical Decision Making Stable/Uncomplicated    Rehab Potential Fair    PT Frequency 2x / week    PT Duration 8 weeks    PT Treatment/Interventions Therapeutic activities;Neuromuscular re-education;Therapeutic exercise;Patient/family education;Manual techniques;Dry needling;Spinal Manipulations;Joint Manipulations;Dealer  Stimulation;Iontophoresis '4mg'$ /ml Dexamethasone    PT Next Visit Plan Posture, scapular, shoulder strengthening, stretches, manual techniques, modalities PRN    PT Home Exercise Plan Medbridge Access Code: [email or web address]    Consulted and Agree with Plan of Care Patient              Joneen Boers PT, DPT   12/20/2022, 9:49 AM

## 2022-12-22 ENCOUNTER — Ambulatory Visit: Payer: Medicare Other

## 2022-12-22 DIAGNOSIS — M25511 Pain in right shoulder: Secondary | ICD-10-CM | POA: Diagnosis not present

## 2022-12-22 DIAGNOSIS — G8929 Other chronic pain: Secondary | ICD-10-CM | POA: Diagnosis not present

## 2022-12-22 DIAGNOSIS — M25611 Stiffness of right shoulder, not elsewhere classified: Secondary | ICD-10-CM | POA: Diagnosis not present

## 2022-12-22 NOTE — Therapy (Signed)
OUTPATIENT PHYSICAL THERAPY TREATMENT NOTE   Patient Name: Patrick Sawyer MRN: [id number] DOB:06-28-1941, 82 y.o., male Today's Date: 12/22/2022  PCP: Ria Bush, MD  REFERRING PROVIDER: Owens Loffler, MD    END OF SESSION:  PT End of Session - 12/22/22 0849     Visit Number 8    Number of Visits 17    Date for PT Re-Evaluation 01/19/23    Progress Note Due on Visit 10    PT Start Time 0849    PT Stop Time 0930    PT Time Calculation (min) 41 min    Activity Tolerance Patient tolerated treatment well    Behavior During Therapy [facility] for tasks assessed/performed                 Past Medical History:  Diagnosis Date   AAA (abdominal aortic aneurysm) ([place])    stable over last 3 yrs   Alcohol use longstanding   quits cold [place] for lent   Allergy    seasonal   Basal cell carcinoma 03/03/2014   left posterior ear, superficial    Cervical arthritis    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    Dysplastic nevus 07/24/2009   left thigh    Hearing loss of both ears    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, 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   HLD (hyperlipidemia)    Obesity, Class I, BMI 30-34.9    Seasonal allergies    Traumatic enucleation of left eye    Past Surgical History:  Procedure Laterality Date   APPENDECTOMY  1959   COLONOSCOPY  01/2015   small polyps, rpt 5 yrs Tiffany Kocher)   COLONOSCOPY  06/2012   small polyps Vira Agar)   COLONOSCOPY  02/2020   1 HP, diverticulosis, int hem (Pyrtle)   ENUCLEATION Left 1969   HEMORRHOID BANDING  04/2015   Dr Carilyn Goodpasture HERNIA REPAIR Right    MENISCUS REPAIR Left 2005   [place]  04/2014   [place]  2006   Patient Active Problem List   Diagnosis Date Noted   Chronic right shoulder pain 09/20/2022   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   Skin lump of leg, left 06/14/2021   Varicose veins of both lower extremities without ulcer or inflammation 07/28/2020   Medicare  annual wellness visit, subsequent 07/26/2019   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 Started about May 2023 and has improved substantially since then. Almost has full ROM now. Has not yet had PT for his shoulder. Was given a handout for different exercises (pendulums, standing R shoulder flexion at wall, behind the back R shoulder IR stretch). No neck pain. Pt is R hand dominant.   PRECAUTIONS: No known precautions   SUBJECTIVE:   SUBJECTIVE STATEMENT: No pain in R shoulder. Some pain at end ranges reporting weakness when picking weighted objects up overhead or off of the ground.  PAIN:  Are you having pain? See subjective.     TODAY'S TREATMENT:                                                                                                                                         DATE: 12/22/2022  No latex allergies No blood pressure problems per pt.    The L posterior ear basal cell CA was removed. 2015   Therapeutic Exercise  Standing R shoulder flexion AROM at start of session 125 degrees  Standing RUE ranger: 1x20, 1x20 with 3 CCW and CW circles for rhythmic stabilization  Standing blue ball walks on the wall for overhead strengthening/endurance for ADL completion: 2x20   Standing R shoulder ER with GTB: 3x8, good form/technique.   Scap retractions at [facility]: 3x15, 25#. Excellent form/technique with exercise after demo.     Manual therapy in supine and towel roll on cervical spine for comfort. 9 minutes total  R shoulder Grade 4 mobilizations AP and inferior 4x15 second bouts in neutral position to improve overhead mobility.   R shoulder Grade 4 mobilizations AP and inferior in varying ranges of abduction and flexion 4x15 second bouts to improve overhead mobility.     Standing R shoulder flexion AROM at end of session 142 degrees     Improved exercise technique, movement at target joints, use of target muscles after mod verbal, visual, tactile cues.           Response to treatment Pt tolerated session well without aggravation of symptoms.   R shoulder flexion AROM after session improved from 125 to 142 degrees        Clinical impression Continuing PT POC with improving R shoulder AROM and strength. Due to reports of weakness in overhead and forward reaching positions, additional exercises provided on overhead strengthening and rhythmic stabilizations to improve ADL completion. Pt remains with limitations primarily with inferior GHJ representative of pt's limitations in overhead mobility. Pt does improve R shoulder flexion prior to treatment at 125 degrees to 142 degrees post session. Pt will benefit from skilled Pt services to decrease pain, improve shoulder mobility, improve strength to  return to PLOF.     PATIENT EDUCATION: Education details: there-ex, HEP Person educated: Patient Education method: Explanation, Demonstration, Tactile cues, Verbal cues, and Handouts Education comprehension: verbalized understanding and returned demonstration  HOME EXERCISE PROGRAM: Home Exercise program Access Code: [email or web address] URL: [email or web address] Date: 11/21/2022 Prepared by: Joneen Boers   Exercises - Single Arm Doorway Pec Stretch at 60 Elevation  - 3 x daily - 7 x weekly - 1 sets - 3 reps - 30 seconds hold - Seated Shoulder Inferior Glide  - 1 x daily - 7 x weekly - 3 sets - 10  reps - 5 seconds hold  - Isometric Shoulder Internal Rotation  - 1 x daily - 7 x weekly - 3 sets - 10 reps -  5 seconds hold - Shoulder External Rotation and Scapular Retraction with Resistance  - 1 x daily - 7 x weekly - 3 sets - 10 reps - Seated Passive Cervical Retraction  - 1 x daily - 7 x weekly - 3 sets - 10 reps - 5 seconds hold   PT Short Term Goals - 11/21/22 0953       PT SHORT TERM GOAL #1   Title Pt will be independent with his initial HEP to improve ROM, strength, and function.    Baseline Pt has started his HEP (11/21/2022)    Time 3    Period Weeks    Status New    Target Date 12/15/22              PT Long Term Goals - 11/21/22 0954       PT LONG TERM GOAL #1   Title Pt will have a decrease in R shoulder pain to 1/10 or less at worst to promote ability to reach more comfortably.    Baseline 3/10 R shoulder pain at worst for the past 3 months (11/21/2022)    Time 8    Period Weeks    Status New    Target Date 01/19/23      PT LONG TERM GOAL #2   Title Pt will improve R shoulder flexion, AROM to 140 at least degrees, abduction to at least 160 degrees to promote ability to reach more comfortably.    Baseline R shoulder AROM 112 degrees flexion, 116 degrees abduction (11/21/2022)    Time 8    Period Weeks    Status New    Target Date 01/19/23      PT LONG TERM GOAL #3   Title Pt will improve his R shoulder strength by at least 1/2 MMT to promote ability to lift items to a shelf, reach, pick up something from the floor more comfortably.    Baseline R lower trap 4/5, middle trap 4-/5, flexion 4/5, abduction 4/5, ER 4/5, IR 4+/5 (11/21/2022)    Time 8    Period Weeks    Status New    Target Date 01/19/23      PT LONG TERM GOAL #4   Title Pt will improve his R shoulder FOTO score by at least 10 points as a demonstration of improved function.    Baseline R shoulder FOTO 60 (11/21/2022)    Time 8    Period Weeks    Status New    Target Date 01/19/23               Plan - 12/22/22 0941     Clinical Impression Statement Continuing PT POC with improving R shoulder AROM and strength. Due to reports of weakness in overhead and forward reaching positions, additional exercises provided on overhead strengthening and rhythmic stabilizations to improve ADL completion. Pt remains with limitations primarily with inferior GHJ representative of pt's limitations in overhead mobility. Pt does improve R shoulder flexion prior to treatment at 125 degrees to 142 degrees post session. Pt will benefit from skilled Pt services to decrease pain, improve shoulder mobility, improve strength to  return to PLOF.    Personal Factors and Comorbidities Age;Time since onset of injury/illness/exacerbation;Comorbidity 2    Comorbidities AAA, hx of basal cell CA    Examination-Activity Limitations Reach Overhead;Lift    Stability/Clinical Decision Making Stable/Uncomplicated    Rehab Potential Fair    PT Frequency 2x / week    PT Duration 8 weeks  PT Treatment/Interventions Therapeutic activities;Neuromuscular re-education;Therapeutic exercise;Patient/family education;Manual techniques;Dry needling;Spinal Manipulations;Joint Manipulations;Electrical Stimulation;Iontophoresis '4mg'$ /ml Dexamethasone    PT Next Visit Plan Posture, scapular, shoulder strengthening, stretches, manual techniques, modalities PRN    PT Home Exercise Plan Medbridge Access Code: [email or web address]    Consulted and Agree with Plan of Care Patient              Salem Caster. Fairly IV, PT, DPT Physical Therapist- [facility]  12/22/2022, 9:54 AM

## 2022-12-27 ENCOUNTER — Ambulatory Visit: Payer: Medicare Other

## 2022-12-27 DIAGNOSIS — G8929 Other chronic pain: Secondary | ICD-10-CM

## 2022-12-27 DIAGNOSIS — M25611 Stiffness of right shoulder, not elsewhere classified: Secondary | ICD-10-CM

## 2022-12-27 DIAGNOSIS — M25511 Pain in right shoulder: Secondary | ICD-10-CM | POA: Diagnosis not present

## 2022-12-27 NOTE — Therapy (Signed)
OUTPATIENT PHYSICAL THERAPY TREATMENT NOTE   Patient Name: Patrick Sawyer MRN: [id number] DOB:02/24/1941, 82 y.o., male Today's Date: 12/27/2022  PCP: Ria Bush, MD  REFERRING PROVIDER: Owens Loffler, MD    END OF SESSION:  PT End of Session - 12/27/22 0844     Visit Number 9    Number of Visits 17    Date for PT Re-Evaluation 01/19/23    Progress Note Due on Visit 10    PT Start Time [id number]    PT Stop Time 0929    PT Time Calculation (min) 45 min    Activity Tolerance Patient tolerated treatment well    Behavior During Therapy [facility] for tasks assessed/performed                  Past Medical History:  Diagnosis Date   AAA (abdominal aortic aneurysm) ([place])    stable over last 3 yrs   Alcohol use longstanding   quits cold [place] for lent   Allergy    seasonal   Basal cell carcinoma 03/03/2014   left posterior ear, superficial    Cervical arthritis    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    Dysplastic nevus 07/24/2009   left thigh    Hearing loss of both ears    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, 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   HLD (hyperlipidemia)    Obesity, Class I, BMI 30-34.9    Seasonal allergies    Traumatic enucleation of left eye    Past Surgical History:  Procedure Laterality Date   APPENDECTOMY  1959   COLONOSCOPY  01/2015   small polyps, rpt 5 yrs Tiffany Kocher)   COLONOSCOPY  06/2012   small polyps Vira Agar)   COLONOSCOPY  02/2020   1 HP, diverticulosis, int hem (Pyrtle)   ENUCLEATION Left 1969   HEMORRHOID BANDING  04/2015   Dr Carilyn Goodpasture HERNIA REPAIR Right    MENISCUS REPAIR Left 2005   [place]  04/2014   [place]  2006   Patient Active Problem List   Diagnosis Date Noted   Chronic right shoulder pain 09/20/2022   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   Skin lump of leg, left 06/14/2021   Varicose veins of both lower extremities without ulcer or inflammation 07/28/2020    Medicare annual wellness visit, subsequent 07/26/2019   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 Started about May 2023 and has improved substantially since then. Almost has full ROM now. Has not yet had PT for his shoulder. Was given a handout for different exercises (pendulums, standing R shoulder flexion at wall, behind the back R shoulder IR stretch). No neck pain. Pt is R hand dominant.   PRECAUTIONS: No known precautions   SUBJECTIVE:   SUBJECTIVE STATEMENT: R shoulder is good, no pain. R shoulder is doing good.   PAIN:  Are you having pain? See subjective.  TODAY'S TREATMENT:                                                                                                                                         DATE: 12/27/2022  No latex allergies No blood pressure problems per pt.    The L posterior ear basal cell CA was removed. 2015  Last PT session 01/12/2023  Manual therapy  in supine and towel roll on cervical spine for comfort   R shoulder Grade 4 mobilizations AP and inferior lateral in varying ranges of abduction and flexion to improve overhead mobility.   Supine STM R teres major muscle with shoulder in flexion to decrease tension and improve fascial mobility       Therapeutic Exercise  Standing R shoulder flexion AROM at start of session 125 degrees  Supine B shoulder flexion with dowel, 5 lbs 10x5 seconds  for 2 sets  Then just 5 lbs dumbbell 10x5 seconds  Supine R shoulder scaption AAROM with dowel 10x5 seconds to decrease stiffness   Supine R shoulder circles with 3 lbs 10x3 clockwise and counter clockwise in 90 degrees flexion    Standing R shoulder flexion AROM after aforementioned exercises: 135 degrees   Standing R shoulder ER with GTB: 3x8, good form/technique.   Standing blue ball walks on the wall for overhead strengthening/endurance for ADL completion: x20      Improved exercise technique, movement at target joints, use of target muscles after mod verbal, visual, tactile cues.           Response to treatment Pt tolerated session well without aggravation of symptoms.   R shoulder flexion AROM after session improved to 140 degrees.        Clinical impression  Continued with R glenohumeral joint mobilization, manual techniques to decrease R teres major muscle tightness, as well as AAROM exercises to decrease stiffness and improve ability to raise his arm up with less difficulty. Pt able to achieve 140 degrees R shoulder flexion at end of session. Pt will benefit from continued skilled physical therapy services to decreased pain, improve ROM, strength and function.         PATIENT EDUCATION: Education details: there-ex, HEP Person educated: Patient Education method: Explanation, Demonstration, Tactile cues, Verbal cues, and Handouts Education comprehension: verbalized understanding and returned demonstration  HOME EXERCISE PROGRAM: Home Exercise program Access Code: [email or web address] URL: [email or web address] Date: 11/21/2022 Prepared by: Joneen Boers   Exercises - Single Arm Doorway Pec Stretch at 60 Elevation  - 3 x daily - 7 x weekly - 1 sets - 3 reps - 30 seconds hold - Seated Shoulder Inferior Glide  - 1 x daily - 7 x weekly - 3 sets - 10 reps - 5 seconds hold  - Isometric Shoulder Internal Rotation  - 1 x daily - 7 x weekly - 3 sets - 10 reps  - 5 seconds hold -  Shoulder External Rotation and Scapular Retraction with Resistance  - 1 x daily - 7 x weekly - 3 sets - 10 reps - Seated Passive Cervical Retraction  - 1 x daily - 7 x weekly - 3 sets - 10 reps - 5 seconds hold - Supine Shoulder Scaption with Dowel  - 2 x daily - 7 x weekly - 3 sets - 10 reps - 5 seconds hold - Supine Shoulder Flexion PROM  - 2 x daily - 7 x weekly - 3 sets - 10 reps - 5 seconds hold  PT Short Term Goals - 11/21/22 0953       PT SHORT TERM GOAL #1   Title Pt will be independent with his initial HEP to improve ROM, strength, and function.    Baseline Pt has started his HEP (11/21/2022)    Time 3    Period Weeks    Status New    Target Date 12/15/22              PT Long Term Goals - 11/21/22 0954       PT LONG TERM GOAL #1   Title Pt will have a decrease in R shoulder pain to 1/10 or less at worst to promote ability to reach more comfortably.    Baseline 3/10 R shoulder pain at worst for the past 3 months (11/21/2022)    Time 8    Period Weeks    Status New    Target Date 01/19/23      PT LONG TERM GOAL #2   Title Pt will improve R shoulder flexion, AROM to 140 at least degrees, abduction to at least 160 degrees to promote ability to reach more comfortably.    Baseline R shoulder AROM 112 degrees flexion, 116 degrees abduction (11/21/2022)    Time 8    Period Weeks    Status New    Target Date 01/19/23      PT LONG TERM GOAL #3   Title Pt will improve his R shoulder strength by at least 1/2 MMT to promote ability to lift items to a shelf, reach, pick up something from the floor more comfortably.    Baseline R lower trap 4/5, middle trap 4-/5, flexion 4/5, abduction 4/5, ER 4/5, IR 4+/5 (11/21/2022)    Time 8    Period Weeks    Status New    Target Date 01/19/23      PT LONG TERM GOAL #4   Title Pt will improve his R shoulder FOTO score by at least 10 points as a demonstration of improved function.    Baseline R shoulder FOTO 60 (11/21/2022)     Time 8    Period Weeks    Status New    Target Date 01/19/23              Plan - 12/27/22 0842     Clinical Impression Statement Continued with R glenohumeral joint mobilization, manual techniques to decrease R teres major muscle tightness, as well as AAROM exercises to decrease stiffness and improve ability to raise his arm up with less difficulty. Pt able to achieve 140 degrees R shoulder flexion at end of session. Pt will benefit from continued skilled physical therapy services to decreased pain, improve ROM, strength and function.    Personal Factors and Comorbidities Age;Time since onset of injury/illness/exacerbation;Comorbidity 2    Comorbidities AAA, hx of basal cell CA    Examination-Activity Limitations Reach Overhead;Lift    Stability/Clinical Decision Making  Stable/Uncomplicated    Rehab Potential Fair    PT Frequency 2x / week    PT Duration 8 weeks    PT Treatment/Interventions Therapeutic activities;Neuromuscular re-education;Therapeutic exercise;Patient/family education;Manual techniques;Dry needling;Spinal Manipulations;Joint Manipulations;Electrical Stimulation;Iontophoresis 77m/ml Dexamethasone    PT Next Visit Plan Posture, scapular, shoulder strengthening, stretches, manual techniques, modalities PRN    PT Home Exercise Plan Medbridge Access Code: [email or web address]   Consulted and Agree with Plan of Care Patient              MJoneen BoersPT, DPT   12/27/2022, 11:09 AM

## 2022-12-29 ENCOUNTER — Ambulatory Visit: Payer: Medicare Other

## 2022-12-29 ENCOUNTER — Encounter: Payer: Self-pay | Admitting: Physical Therapy

## 2022-12-29 DIAGNOSIS — M25511 Pain in right shoulder: Secondary | ICD-10-CM | POA: Diagnosis not present

## 2022-12-29 DIAGNOSIS — M25611 Stiffness of right shoulder, not elsewhere classified: Secondary | ICD-10-CM | POA: Diagnosis not present

## 2022-12-29 DIAGNOSIS — G8929 Other chronic pain: Secondary | ICD-10-CM

## 2022-12-29 NOTE — Therapy (Signed)
OUTPATIENT PHYSICAL THERAPY TREATMENT NOTE/PROGRESS NOTE  Dates of Reporting Period: 11/21/22 - 12/29/22   Patient Name: Patrick Sawyer MRN: [id number] DOB:July 11, 1941, 82 y.o., male Today's Date: 12/29/2022  PCP: Ria Bush, MD  REFERRING PROVIDER: Owens Loffler, MD    END OF SESSION:  PT End of Session - 12/29/22 0847     Visit Number 10    Number of Visits 17    Date for PT Re-Evaluation 01/19/23    Progress Note Due on Visit 10    PT Start Time 0845    PT Stop Time 0930    PT Time Calculation (min) 45 min    Activity Tolerance Patient tolerated treatment well    Behavior During Therapy [facility] for tasks assessed/performed                  Past Medical History:  Diagnosis Date   AAA (abdominal aortic aneurysm) ([place])    stable over last 3 yrs   Alcohol use longstanding   quits cold [place] for lent   Allergy    seasonal   Basal cell carcinoma 03/03/2014   left posterior ear, superficial    Cervical arthritis    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    Dysplastic nevus 07/24/2009   left thigh    Hearing loss of both ears    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, 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   HLD (hyperlipidemia)    Obesity, Class I, BMI 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    Seasonal allergies    Traumatic enucleation of left eye    Past Surgical History:  Procedure Laterality Date   APPENDECTOMY  1959   COLONOSCOPY  01/2015   small polyps, rpt 5 yrs Tiffany Kocher)   COLONOSCOPY  06/2012   small polyps Vira Agar)   COLONOSCOPY  02/2020   1 HP, diverticulosis, int hem (Pyrtle)   ENUCLEATION Left 1969   HEMORRHOID BANDING  04/2015   Dr Carilyn Goodpasture HERNIA REPAIR Right    MENISCUS REPAIR Left 2005   [place]  04/2014   [place]  2006   Patient Active Problem List   Diagnosis Date Noted   Chronic right shoulder pain 09/20/2022   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   Skin lump of leg, left 06/14/2021   Varicose veins of both lower  extremities without ulcer or inflammation 07/28/2020   Medicare annual wellness visit, subsequent 07/26/2019   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 Started about May 2023 and has improved substantially since then. Almost has full ROM now. Has not yet had PT for his shoulder. Was given a handout for different exercises (pendulums, standing R shoulder flexion at wall, behind the back R shoulder IR stretch). No neck pain. Pt is R hand dominant.   PRECAUTIONS: No known precautions   SUBJECTIVE:   SUBJECTIVE STATEMENT: R shoulder does not have any pain. Reports improvement in using R shoulder overhead.  PAIN:  Are you having pain? See subjective.     TODAY'S TREATMENT:                                                                                                                                         DATE: 12/29/2022  No latex allergies No blood pressure problems per pt.    The L posterior ear basal cell CA was removed. 2015  Last PT session 01/12/2023     Standing R shoulder flexion AROM at start of session 130 degrees    Therapeutic Exercise   UBE 2.5 minutes forward, 2.5 minutes backward, Level 4 for UE warm up.   Review of goals. See updated goals section for details.   Standing RUE ranger: 1x20, 1x20 with 3 CCW and CW circles for rhythmic stabilization   Standing R shoulder ER with GTB: 1x8, good form/technique.  Progressed 3x8 with blue TB.    Scap retractions at [facility]: 1x15, 35#. Progressed to 3x8, 45#. Excellent form/technique with exercise  Alternating shoulder flexion with 5# DB's. Extensive effort on second and third set to elevate R shoulder overhead reporting R shoulder fatigue.   Discussion with gym equipment performing unilateral exercise to improve R shoulder strength to assist in avoiding L shoulder compensations with BUE exercises like resistance machines.        Improved exercise technique, movement at target joints, use of target muscles after mod verbal, visual, tactile cues.           Response to treatment Pt tolerated session well without aggravation of symptoms.   R shoulder flexion AROM after session improved to 149 degrees.        Clinical impression Pt on 10th visit leading to progress note. Pt has achieved FOTO and pain goals with absence of R shoulder pain. Deferred strength assessment via MMT due to poor interrater reliability. Although pt has not achieved R shoulder AROM goals his AROM before mobility exercise demonstrates excellent improvements from evaluation. Pt does demonstrate functional weakness of UE's overhead though comparing LUE to RUE. Pt will benefit from continued, skilled PT services to progress AROM and strength overhead to return to PLOF. Patient's condition has the potential to improve in response to therapy. Maximum improvement is yet to be obtained. The anticipated improvement is attainable and reasonable in a generally predictable time.     PATIENT EDUCATION: Education details: there-ex, HEP Person educated: Patient Education method: Explanation, Demonstration, Tactile cues, Verbal cues, and Handouts Education comprehension: verbalized understanding and returned demonstration  HOME EXERCISE PROGRAM: Home Exercise program Access Code: [email or web address] URL: [email or web address] Date: 11/21/2022 Prepared by: Joneen Boers    Exercises - Single Arm Doorway Pec Stretch at 60 Elevation  - 3 x daily - 7 x weekly - 1 sets - 3 reps - 30 seconds hold - Seated Shoulder Inferior Glide  - 1 x daily - 7 x weekly - 3 sets -  10 reps - 5 seconds hold  - Isometric Shoulder Internal Rotation  - 1 x daily - 7 x weekly - 3 sets - 10 reps - 5 seconds hold - Shoulder External Rotation and Scapular Retraction with Resistance  - 1 x daily - 7 x weekly - 3 sets - 10 reps - Seated Passive Cervical Retraction  - 1 x daily - 7 x weekly - 3 sets - 10 reps - 5 seconds hold - Supine Shoulder Scaption with Dowel  - 2 x daily - 7 x weekly - 3 sets - 10 reps - 5 seconds hold - Supine Shoulder Flexion PROM  - 2 x daily - 7 x weekly - 3 sets - 10 reps - 5 seconds hold   PT Short Term Goals - 12/29/22 0855       PT SHORT TERM GOAL #1   Title Pt will be independent with his initial HEP to improve ROM, strength, and function.    Baseline Pt has started his HEP (11/21/2022); 12/29/2022: completes HEP intermittently. Mainly focuses on gym work outs 3x/week    Time 3    Period Weeks    Status On-going    Target Date 01/12/23              PT Long Term Goals - 12/29/22 0856       PT LONG TERM GOAL #1   Title Pt will have a decrease in R shoulder pain to 1/10 or less at worst to promote ability to reach more comfortably.    Baseline 3/10 R shoulder pain at worst for the past 3 months (11/21/2022); 12/29/2022: denies pain with R shoulder, reports discomfort at end ranges.    Time 8    Period Weeks    Status Achieved    Target Date 12/29/22      PT LONG TERM GOAL #2   Title Pt will improve R shoulder flexion, AROM to 140 at least degrees, abduction to at least 160 degrees to promote ability to reach more comfortably.    Baseline R shoulder AROM 112 degrees flexion, 116 degrees abduction (11/21/2022); R shoulder flexion:130 degrees , R shoulder abduction: 135 degrees abduction    Time 8    Period Weeks    Status On-going    Target Date 01/19/23       PT LONG TERM GOAL #3   Title Pt will improve his R shoulder strength by at least 1/2 MMT to promote ability to lift items to a shelf, reach, pick up something from the floor more comfortably.    Baseline R lower trap 4/5, middle trap 4-/5, flexion 4/5, abduction 4/5, ER 4/5, IR 4+/5 (11/21/2022); 12/29/2022 deferred due to poor interrate reliability.    Time 8    Period Weeks    Status On-going    Target Date 01/19/23      PT LONG TERM GOAL #4   Title Pt will improve his R shoulder FOTO score by at least 10 points as a demonstration of improved function.    Baseline R shoulder FOTO 60 (11/21/2022); 12/29/2022: 72    Time 8    Period Weeks    Status Achieved    Target Date 12/29/22                 Salem Caster. Fairly IV, PT, DPT Physical Therapist- [facility]  12/29/2022, 9:36 AM

## 2023-01-03 ENCOUNTER — Ambulatory Visit: Payer: Medicare Other

## 2023-01-03 DIAGNOSIS — M25611 Stiffness of right shoulder, not elsewhere classified: Secondary | ICD-10-CM | POA: Diagnosis not present

## 2023-01-03 DIAGNOSIS — G8929 Other chronic pain: Secondary | ICD-10-CM | POA: Diagnosis not present

## 2023-01-03 DIAGNOSIS — M25511 Pain in right shoulder: Secondary | ICD-10-CM | POA: Diagnosis not present

## 2023-01-03 NOTE — Therapy (Signed)
OUTPATIENT PHYSICAL THERAPY TREATMENT NOTE   Patient Name: Patrick Sawyer MRN: [id number] DOB:January 02, 1941, 82 y.o., male Today's Date: 01/03/2023  PCP: Ria Bush, MD  REFERRING PROVIDER: Owens Loffler, MD    END OF SESSION:  PT End of Session - 01/03/23 0847     Visit Number 11    Number of Visits 17    Date for PT Re-Evaluation 01/19/23    Progress Note Due on Visit 10    PT Start Time [id number]    PT Stop Time 0937    PT Time Calculation (min) 50 min    Activity Tolerance Patient tolerated treatment well    Behavior During Therapy [facility] for tasks assessed/performed                   Past Medical History:  Diagnosis Date   AAA (abdominal aortic aneurysm) ([place])    stable over last 3 yrs   Alcohol use longstanding   quits cold [place] for lent   Allergy    seasonal   Basal cell carcinoma 03/03/2014   left posterior ear, superficial    Cervical arthritis    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    Dysplastic nevus 07/24/2009   left thigh    Hearing loss of both ears    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, 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   HLD (hyperlipidemia)    Obesity, Class I, BMI 30-34.9    Seasonal allergies    Traumatic enucleation of left eye    Past Surgical History:  Procedure Laterality Date   APPENDECTOMY  1959   COLONOSCOPY  01/2015   small polyps, rpt 5 yrs Tiffany Kocher)   COLONOSCOPY  06/2012   small polyps Vira Agar)   COLONOSCOPY  02/2020   1 HP, diverticulosis, int hem ([place])   ENUCLEATION Left 1969   HEMORRHOID BANDING  04/2015   Dr Carilyn Goodpasture HERNIA REPAIR Right    MENISCUS REPAIR Left 2005   [place]  04/2014   [place]  2006   Patient Active Problem List   Diagnosis Date Noted   Chronic right shoulder pain 09/20/2022   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   Skin lump of leg, left 06/14/2021   Varicose veins of both lower extremities without ulcer or inflammation 07/28/2020    Medicare annual wellness visit, subsequent 07/26/2019   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 Started about May 2023 and has improved substantially since then. Almost has full ROM now. Has not yet had PT for his shoulder. Was given a handout for different exercises (pendulums, standing R shoulder flexion at wall, behind the back R shoulder IR stretch). No neck pain. Pt is R hand dominant.   PRECAUTIONS: No known precautions   SUBJECTIVE:   SUBJECTIVE STATEMENT: R shoulder is good, no pain.   PAIN:  Are you having pain? See subjective.     TODAY'S TREATMENT:  DATE: 01/03/2023  No latex allergies No blood pressure problems per pt.    The L posterior ear basal cell CA was removed. 2015  Last PT session 01/12/2023  Manual therapy  Supine STM R teres major muscle with shoulder in flexion to decrease tension and improve fascial mobility  Supine STM R pectoralis major muscle to decrease tension and scapular protraction.     Therapeutic Exercise  Standing R shoulder flexion AROM at start of session 125 degrees  Manually resisted prone lower trap, middle trap raise, standing R shoulder flexion, abduction, ER, and IR 1x each way  Prone R lower trap raise 10x5 seconds, then 5x5 seconds   Standing RUE  ranger:   Flexion 10x5 seconds for 2 sets  Scaption 10x5 seconds for 2 sets  Scap retractions at OMEGA: 3x8, 45 lbs     Shoulder circles with 2 kg ball on wall at 90 degrees flexion 20x each direction  Standing R shoulder ER 10x2 with blue TB.    Alternating shoulder flexion with 5# DB's 5x R and L   R shoulder shrug compensation.     Improved exercise technique, movement at target joints, use of target muscles after mod verbal, visual, tactile cues.           Response to treatment Pt tolerated session well without aggravation of symptoms.         Clinical impression  Continued working on improving R shoulder strength and ROM to improve function. Improved R shoulder strength since initial evaluation. Pt tolerated session well without aggravation of symptoms. Pt will benefit from continued skilled physical therapy services to decreased pain, improve ROM, strength and function.         PATIENT EDUCATION: Education details: there-ex, HEP Person educated: Patient Education method: Explanation, Demonstration, Tactile cues, Verbal cues, and Handouts Education comprehension: verbalized understanding and returned demonstration  HOME EXERCISE PROGRAM: Home Exercise program Access Code: [email or web address] URL: [email or web address] Date: 11/21/2022 Prepared by: Joneen Boers   Exercises - Single Arm Doorway Pec Stretch at 60 Elevation  - 3 x daily - 7 x weekly - 1 sets - 3 reps - 30 seconds hold - Seated Shoulder Inferior Glide  - 1 x daily - 7 x weekly - 3 sets - 10 reps - 5 seconds hold  - Isometric Shoulder Internal Rotation  - 1 x daily - 7 x weekly - 3 sets - 10 reps - 5 seconds hold - Shoulder External Rotation and Scapular Retraction with Resistance  - 1 x daily - 7 x weekly - 3 sets - 10 reps - Seated Passive Cervical Retraction  - 1 x daily - 7 x weekly - 3 sets - 10 reps - 5 seconds hold - Supine Shoulder Scaption with Dowel  - 2 x daily - 7 x weekly - 3  sets - 10 reps - 5 seconds hold - Supine Shoulder Flexion PROM  - 2 x daily - 7 x weekly - 3 sets - 10 reps - 5 seconds hold  PT Short Term Goals - 12/29/22 0855       PT SHORT TERM GOAL #1   Title Pt will be independent with his initial HEP to improve ROM, strength, and function.    Baseline Pt has started his HEP (11/21/2022); 12/29/2022: completes HEP intermittently. Mainly focuses on gym work outs 3x/week    Time 3    Period Weeks    Status On-going    Target Date 01/12/23  PT Long Term Goals - 01/03/23 0849       PT LONG TERM GOAL #1   Title Pt will have a decrease in R shoulder pain to 1/10 or less at worst to promote ability to reach more comfortably.    Baseline 3/10 R shoulder pain at worst for the past 3 months (11/21/2022); 12/29/2022: denies pain with R shoulder, reports discomfort at end ranges.    Time 8    Period Weeks    Status Achieved    Target Date 12/29/22      PT LONG TERM GOAL #2   Title Pt will improve R shoulder flexion, AROM to 140 at least degrees, abduction to at least 160 degrees to promote ability to reach more comfortably.    Baseline R shoulder AROM 112 degrees flexion, 116 degrees abduction (11/21/2022); R shoulder flexion:130 degrees , R shoulder abduction: 135 degrees abduction    Time 8    Period Weeks    Status On-going    Target Date 01/19/23      PT LONG TERM GOAL #3   Title Pt will improve his R shoulder strength by at least 1/2 MMT to promote ability to lift items to a shelf, reach, pick up something from the floor more comfortably.    Baseline R lower trap 4/5, middle trap 4-/5, flexion 4/5, abduction 4/5, ER 4/5, IR 4+/5 (11/21/2022); 12/29/2022 deferred due to poor interrate reliability.; R lower trap 4/5, R middle trap 4/5, flexion 4+/5, abduction 4+/5, ER 4+/5, IR 5/5 (01/03/2023)    Time 8    Period Weeks    Status Partially Met    Target Date 01/19/23      PT LONG TERM GOAL #4   Title Pt will improve his R shoulder FOTO  score by at least 10 points as a demonstration of improved function.    Baseline R shoulder FOTO 60 (11/21/2022); 12/29/2022: 72    Time 8    Period Weeks    Status Achieved    Target Date 12/29/22              Plan - 01/03/23 0845     Clinical Impression Statement Continued working on improving R shoulder strength and ROM to improve function. Improved R shoulder strength since initial evaluation. Pt tolerated session well without aggravation of symptoms. Pt will benefit from continued skilled physical therapy services to decreased pain, improve ROM, strength and function.    Personal Factors and Comorbidities Age;Time since onset of injury/illness/exacerbation;Comorbidity 2    Comorbidities AAA, hx of basal cell CA    Examination-Activity Limitations Reach Overhead;Lift    Stability/Clinical Decision Making Stable/Uncomplicated    Clinical Decision Making Low    Rehab Potential Fair    PT Frequency 2x / week    PT Duration 8 weeks    PT Treatment/Interventions Therapeutic activities;Neuromuscular re-education;Therapeutic exercise;Patient/family education;Manual techniques;Dry needling;Spinal Manipulations;Joint Manipulations;Electrical Stimulation;Iontophoresis 3m/ml Dexamethasone    PT Next Visit Plan Posture, scapular, shoulder strengthening, stretches, manual techniques, modalities PRN    PT Home Exercise Plan Medbridge Access Code: [email or web address]   Consulted and Agree with Plan of Care Patient              MJoneen BoersPT, DPT   01/03/2023, 9:59 AM

## 2023-01-05 ENCOUNTER — Ambulatory Visit (INDEPENDENT_AMBULATORY_CARE_PROVIDER_SITE_OTHER): Payer: Medicare Other | Admitting: Dermatology

## 2023-01-05 ENCOUNTER — Ambulatory Visit: Payer: Medicare Other

## 2023-01-05 VITALS — BP 116/69 | HR 51

## 2023-01-05 DIAGNOSIS — D229 Melanocytic nevi, unspecified: Secondary | ICD-10-CM | POA: Diagnosis not present

## 2023-01-05 DIAGNOSIS — L814 Other melanin hyperpigmentation: Secondary | ICD-10-CM

## 2023-01-05 DIAGNOSIS — Z86018 Personal history of other benign neoplasm: Secondary | ICD-10-CM | POA: Diagnosis not present

## 2023-01-05 DIAGNOSIS — G8929 Other chronic pain: Secondary | ICD-10-CM

## 2023-01-05 DIAGNOSIS — Z85828 Personal history of other malignant neoplasm of skin: Secondary | ICD-10-CM | POA: Diagnosis not present

## 2023-01-05 DIAGNOSIS — B353 Tinea pedis: Secondary | ICD-10-CM | POA: Diagnosis not present

## 2023-01-05 DIAGNOSIS — Z79899 Other long term (current) drug therapy: Secondary | ICD-10-CM | POA: Diagnosis not present

## 2023-01-05 DIAGNOSIS — Z7189 Other specified counseling: Secondary | ICD-10-CM

## 2023-01-05 DIAGNOSIS — M25511 Pain in right shoulder: Secondary | ICD-10-CM | POA: Diagnosis not present

## 2023-01-05 DIAGNOSIS — Z1283 Encounter for screening for malignant neoplasm of skin: Secondary | ICD-10-CM | POA: Diagnosis not present

## 2023-01-05 DIAGNOSIS — M25611 Stiffness of right shoulder, not elsewhere classified: Secondary | ICD-10-CM | POA: Diagnosis not present

## 2023-01-05 DIAGNOSIS — L578 Other skin changes due to chronic exposure to nonionizing radiation: Secondary | ICD-10-CM

## 2023-01-05 MED ORDER — TERBINAFINE HCL 250 MG PO TABS
250.0000 mg | ORAL_TABLET | Freq: Every day | ORAL | 0 refills | Status: DC
Start: 2023-01-05 — End: 2023-02-16

## 2023-01-05 NOTE — Patient Instructions (Signed)
Due to recent changes in healthcare laws, you may see results of your pathology and/or laboratory studies on MyChart before the doctors have had a chance to review them. We understand that in some cases there may be results that are confusing or concerning to you. Please understand that not all results are received at the same time and often the doctors may need to interpret multiple results in order to provide you with the best plan of care or course of treatment. Therefore, we ask that you please give us 2 business days to thoroughly review all your results before contacting the office for clarification. Should we see a critical lab result, you will be contacted sooner.   If You Need Anything After Your Visit  If you have any questions or concerns for your doctor, please call our main line at [phone] and press option 4 to reach your doctor's medical assistant. If no one answers, please leave a voicemail as directed and we will return your call as soon as possible. Messages left after 4 pm will be answered the following business day.   You may also send us a message via MyChart. We typically respond to MyChart messages within 1-2 business days.  For prescription refills, please ask your pharmacy to contact our office. Our fax number is [phone].  If you have an urgent issue when the clinic is closed that cannot wait until the next business day, you can page your doctor at the number below.    Please note that while we do our best to be available for urgent issues outside of office hours, we are not available 24/7.   If you have an urgent issue and are unable to reach us, you may choose to seek medical care at your doctor's office, retail clinic, urgent care center, or emergency room.  If you have a medical emergency, please immediately call 911 or go to the emergency department.  Pager Numbers  - Dr. Kowalski: [phone]  - Dr. Moye: [phone]  - Dr. Stewart:  [phone]  In the event of inclement weather, please call our main line at [phone] for an update on the status of any delays or closures.  Dermatology Medication Tips: Please keep the boxes that topical medications come in in order to help keep track of the instructions about where and how to use these. Pharmacies typically print the medication instructions only on the boxes and not directly on the medication tubes.   If your medication is too expensive, please contact our office at [phone] option 4 or send us a message through MyChart.   We are unable to tell what your co-pay for medications will be in advance as this is different depending on your insurance coverage. However, we may be able to find a substitute medication at lower cost or fill out paperwork to get insurance to cover a needed medication.   If a prior authorization is required to get your medication covered by your insurance company, please allow us 1-2 business days to complete this process.  Drug prices often vary depending on where the prescription is filled and some pharmacies may offer cheaper prices.  The website www.goodrx.com contains coupons for medications through different pharmacies. The prices here do not account for what the cost may be with help from insurance (it may be cheaper with your insurance), but the website can give you the price if you did not use any insurance.  - You can print the associated coupon and take it with   your prescription to the pharmacy.  - You may also stop by our office during regular business hours and pick up a GoodRx coupon card.  - If you need your prescription sent electronically to a different pharmacy, notify our office through [facility] MyChart or by phone at [phone] option 4.     Si Usted Necesita Algo Despus de Su Visita  Tambin puede enviarnos un mensaje a travs de MyChart. Por lo general respondemos a los mensajes de MyChart en el transcurso de 1 a 2  das hbiles.  Para renovar recetas, por favor pida a su farmacia que se ponga en contacto con nuestra oficina. Nuestro nmero de fax es el [phone].  Si tiene un asunto urgente cuando la clnica est cerrada y que no puede esperar hasta el siguiente da hbil, puede llamar/localizar a su doctor(a) al nmero que aparece a continuacin.   Por favor, tenga en cuenta que aunque hacemos todo lo posible para estar disponibles para asuntos urgentes fuera del horario de oficina, no estamos disponibles las 24 horas del da, los 7 das de la semana.   Si tiene un problema urgente y no puede comunicarse con nosotros, puede optar por buscar atencin mdica  en el consultorio de su doctor(a), en una clnica privada, en un centro de atencin urgente o en una sala de emergencias.  Si tiene una emergencia mdica, por favor llame inmediatamente al 911 o vaya a la sala de emergencias.  Nmeros de bper  - Dr. Kowalski: [phone]  - Dra. Moye: [phone]  - Dra. Stewart: [phone]  En caso de inclemencias del tiempo, por favor llame a nuestra lnea principal al [phone] para una actualizacin sobre el estado de cualquier retraso o cierre.  Consejos para la medicacin en dermatologa: Por favor, guarde las cajas en las que vienen los medicamentos de uso tpico para ayudarle a seguir las instrucciones sobre dnde y cmo usarlos. Las farmacias generalmente imprimen las instrucciones del medicamento slo en las cajas y no directamente en los tubos del medicamento.   Si su medicamento es muy caro, por favor, pngase en contacto con nuestra oficina llamando al [phone] y presione la opcin 4 o envenos un mensaje a travs de MyChart.   No podemos decirle cul ser su copago por los medicamentos por adelantado ya que esto es diferente dependiendo de la cobertura de su seguro. Sin embargo, es posible que podamos encontrar un medicamento sustituto a menor costo o llenar un formulario para que el  seguro cubra el medicamento que se considera necesario.   Si se requiere una autorizacin previa para que su compaa de seguros cubra su medicamento, por favor permtanos de 1 a 2 das hbiles para completar este proceso.  Los precios de los medicamentos varan con frecuencia dependiendo del lugar de dnde se surte la receta y alguna farmacias pueden ofrecer precios ms baratos.  El sitio web www.goodrx.com tiene cupones para medicamentos de diferentes farmacias. Los precios aqu no tienen en cuenta lo que podra costar con la ayuda del seguro (puede ser ms barato con su seguro), pero el sitio web puede darle el precio si no utiliz ningn seguro.  - Puede imprimir el cupn correspondiente y llevarlo con su receta a la farmacia.  - Tambin puede pasar por nuestra oficina durante el horario de atencin regular y recoger una tarjeta de cupones de GoodRx.  - Si necesita que su receta se enve electrnicamente a una farmacia diferente, informe a nuestra oficina a travs de MyChart de [place]   o por telfono llamando al [phone] y presione la opcin 4.  

## 2023-01-05 NOTE — Progress Notes (Signed)
Follow-Up Visit   Subjective  Patrick Sawyer is a 82 y.o. male who presents for the following: Follow-up. The patient presents for Total-Body Skin Exam (TBSE) for skin cancer screening and mole check.  The patient has spots, moles and lesions to be evaluated, some may be new or changing and the patient has concerns that these could be cancer.   The following portions of the chart were reviewed this encounter and updated as appropriate:   Tobacco  Allergies  Meds  Problems  Med Hx  Surg Hx  Fam Hx     Review of Systems:  No other skin or systemic complaints except as noted in HPI or Assessment and Plan.  Objective  Well appearing patient in no apparent distress; mood and affect are within normal limits.  A full examination was performed including scalp, head, eyes, ears, nose, lips, neck, chest, axillae, abdomen, back, buttocks, bilateral upper extremities, bilateral lower extremities, hands, feet, fingers, toes, fingernails, and toenails. All findings within normal limits unless otherwise noted below.  feet Scaling and maceration web spaces and over distal and lateral soles.    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 Condition is symptomatic / bothersome to patient. Not to goal.  Labs reviewed from 09/20/2022 WNL  Start Terbinafine 250 mg take 1 tablet daily   Terbinafine Counseling Terbinafine is an anti-fungal medicine that can be applied to the skin (over the counter) or taken by mouth (prescription) to treat fungal infections. The pill version is often used to treat fungal infections of the nails or scalp. While most people do not have any side effects from taking terbinafine pills, some possible side effects of the medicine can include taste changes, headache, loss of smell, vision changes, nausea, vomiting, or diarrhea.   Rare side effects can include irritation of the  liver, allergic reaction, or decrease in blood counts (which may show up as not feeling well or developing an infection). If you are concerned about any of these side effects, please stop the medicine and call your doctor, or in the case of an emergency such as feeling very unwell, seek immediate medical care.    Related Medications terbinafine (LAMISIL) 250 MG tablet Take 1 tablet (250 mg total) by mouth daily.  Lentigines - Scattered tan macules - Due to sun exposure - Benign-appearing, observe - Recommend daily broad spectrum sunscreen SPF 30+ to sun-exposed areas, reapply every 2 hours as needed. - Call for any changes  Seborrheic Keratoses - Stuck-on, waxy, tan-brown papules and/or plaques  - Benign-appearing - Discussed benign etiology and prognosis. - Observe - Call for any changes  Melanocytic Nevi - Tan-brown and/or pink-flesh-colored symmetric macules and papules - Benign appearing on exam today - Observation - Call clinic for new or changing moles - Recommend daily use of broad spectrum spf 30+ sunscreen to sun-exposed areas.   Hemangiomas - Red papules - Discussed benign nature - Observe - Call for any changes  Actinic Damage - Chronic condition, secondary to cumulative UV/sun exposure - diffuse scaly erythematous macules with underlying dyspigmentation - Recommend daily broad spectrum sunscreen SPF 30+ to sun-exposed areas, reapply every 2 hours as needed.  - Staying in the shade or wearing long sleeves, sun glasses (UVA+UVB protection) and wide brim hats (4-inch brim around the entire circumference of the hat) are also recommended for sun protection.  - Call for new or changing lesions.  History  of Dysplastic Nevi Left thigh 2010 - No evidence of recurrence today - Recommend regular full body skin exams - Recommend daily broad spectrum sunscreen SPF 30+ to sun-exposed areas, reapply every 2 hours as needed.  - Call if any new or changing lesions are noted  between office visits   History of Basal Cell Carcinoma of the Skin Left posterior ear 2015 - No evidence of recurrence today - Recommend regular full body skin exams - Recommend daily broad spectrum sunscreen SPF 30+ to sun-exposed areas, reapply every 2 hours as needed.  - Call if any new or changing lesions are noted between office visits   Skin cancer screening performed today.   Return in about 6 weeks (around 02/16/2023) for Tinea pedis and TBSE in 12 months .  IMarye Round, CMA, am acting as scribe for Sarina Ser, MD .  Documentation: I have reviewed the above documentation for accuracy and completeness, and I agree with the above.  Sarina Ser, MD

## 2023-01-05 NOTE — Therapy (Signed)
OUTPATIENT PHYSICAL THERAPY TREATMENT NOTE   Patient Name: Patrick Sawyer MRN: [id number] DOB:1941/01/14, 82 y.o., male Today's Date: 01/05/2023  PCP: Ria Bush, MD  REFERRING PROVIDER: Owens Loffler, MD    END OF SESSION:  PT End of Session - 01/05/23 0841     Visit Number 12    Number of Visits 17    Date for PT Re-Evaluation 01/19/23    Progress Note Due on Visit 22    PT Start Time 0841    PT Stop Time 0930    PT Time Calculation (min) 49 min    Activity Tolerance Patient tolerated treatment well    Behavior During Therapy [facility] for tasks assessed/performed                    Past Medical History:  Diagnosis Date   AAA (abdominal aortic aneurysm) ([place])    stable over last 3 yrs   Alcohol use longstanding   quits cold [place] for lent   Allergy    seasonal   Basal cell carcinoma 03/03/2014   left posterior ear, superficial    Cervical arthritis    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    Dysplastic nevus 07/24/2009   left thigh    Hearing loss of both ears    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, 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   HLD (hyperlipidemia)    Obesity, Class I, BMI 30-34.9    Seasonal allergies    Traumatic enucleation of left eye    Past Surgical History:  Procedure Laterality Date   APPENDECTOMY  1959   COLONOSCOPY  01/2015   small polyps, rpt 5 yrs Tiffany Kocher)   COLONOSCOPY  06/2012   small polyps Vira Agar)   COLONOSCOPY  02/2020   1 HP, diverticulosis, int hem (Pyrtle)   ENUCLEATION Left 1969   HEMORRHOID BANDING  04/2015   Dr Carilyn Goodpasture HERNIA REPAIR Right    MENISCUS REPAIR Left 2005   [place]  04/2014   [place]  2006   Patient Active Problem List   Diagnosis Date Noted   Chronic right shoulder pain 09/20/2022   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   Skin lump of leg, left 06/14/2021   Varicose veins of both lower extremities without ulcer or inflammation 07/28/2020    Medicare annual wellness visit, subsequent 07/26/2019   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 Started about May 2023 and has improved substantially since then. Almost has full ROM now. Has not yet had PT for his shoulder. Was given a handout for different exercises (pendulums, standing R shoulder flexion at wall, behind the back R shoulder IR stretch). No neck pain. Pt is R hand dominant.   PRECAUTIONS: No known precautions   SUBJECTIVE:   SUBJECTIVE STATEMENT: R shoulder is good, no pain. Feeling well   PAIN:  Are you having pain? See subjective.     TODAY'S  TREATMENT:                                                                                                                                         DATE: 01/05/2023  No latex allergies No blood pressure problems per pt.    The L posterior ear basal cell CA was removed. 2015  Last PT session 01/12/2023  Manual therapy  Seated with R arm propped in abduction on table  Posterior inferior shoulder joint glide grade 3+ to 4 to promote joint mobility and decrease stiffness  141 degrees R shoulder flexion afterwards    Therapeutic Exercise  Standing RUE ranger:   Flexion 10x5 seconds for 2 sets  Scaption 10x5 seconds for 2 sets  Abduction 10x5 seconds for 2 sets  Shoulder circles with 2 kg ball on wall at 90 degrees flexion 20x each direction  Scap retractions at OMEGA: 3x8,  45 lbs    Prone R lower trap raise 10x5 seconds, then 5x5 seconds   Seated R shoulder inferior glide self 10x2 with 10 seconds   Standing R shoulder ER 10x2 with blue TB.    Improved exercise technique, movement at target joints, use of target muscles after mod verbal, visual, tactile cues.           Response to treatment Pt tolerated session well without aggravation of symptoms.         Clinical impression  Improved R shoulder AROM after treatment to decrease inferior posterior glenohumeral joint stiffness. Continued working in improving thoracic extension as well as R scapular and shoulder strength to promote better glenohumeral movement when raising his R arm up. Pt tolerated session well without aggravation of symptoms. Pt will benefit from continued skilled physical therapy services to decreased pain, improve ROM, strength and function.         PATIENT EDUCATION: Education details: there-ex, HEP Person educated: Patient Education method: Explanation, Demonstration, Tactile cues, Verbal cues, and Handouts Education comprehension: verbalized understanding and returned demonstration  HOME EXERCISE PROGRAM: Home Exercise program Access Code: [email or web address] URL: [email or web address] Date: 11/21/2022 Prepared by: Joneen Boers   Exercises - Single Arm Doorway Pec Stretch at 60 Elevation  - 3 x daily - 7 x weekly - 1 sets - 3 reps - 30 seconds hold - Seated Shoulder Inferior Glide  - 1 x daily - 7 x weekly - 3 sets - 10 reps - 5 seconds hold  - Isometric Shoulder Internal Rotation  - 1 x daily - 7 x weekly - 3 sets - 10 reps - 5 seconds hold - Shoulder External Rotation and Scapular Retraction with Resistance  - 1 x daily - 7 x weekly - 3 sets - 10 reps - Seated Passive Cervical Retraction  - 1 x daily - 7 x weekly - 3 sets - 10 reps - 5 seconds hold - Supine Shoulder Scaption with Dowel  - 2 x  daily - 7 x weekly - 3 sets - 10 reps - 5 seconds hold - Supine  Shoulder Flexion PROM  - 2 x daily - 7 x weekly - 3 sets - 10 reps - 5 seconds hold     PT Short Term Goals - 12/29/22 0855       PT SHORT TERM GOAL #1   Title Pt will be independent with his initial HEP to improve ROM, strength, and function.    Baseline Pt has started his HEP (11/21/2022); 12/29/2022: completes HEP intermittently. Mainly focuses on gym work outs 3x/week    Time 3    Period Weeks    Status On-going    Target Date 01/12/23              PT Long Term Goals - 01/03/23 0849       PT LONG TERM GOAL #1   Title Pt will have a decrease in R shoulder pain to 1/10 or less at worst to promote ability to reach more comfortably.    Baseline 3/10 R shoulder pain at worst for the past 3 months (11/21/2022); 12/29/2022: denies pain with R shoulder, reports discomfort at end ranges.    Time 8    Period Weeks    Status Achieved    Target Date 12/29/22      PT LONG TERM GOAL #2   Title Pt will improve R shoulder flexion, AROM to 140 at least degrees, abduction to at least 160 degrees to promote ability to reach more comfortably.    Baseline R shoulder AROM 112 degrees flexion, 116 degrees abduction (11/21/2022); R shoulder flexion:130 degrees , R shoulder abduction: 135 degrees abduction    Time 8    Period Weeks    Status On-going    Target Date 01/19/23      PT LONG TERM GOAL #3   Title Pt will improve his R shoulder strength by at least 1/2 MMT to promote ability to lift items to a shelf, reach, pick up something from the floor more comfortably.    Baseline R lower trap 4/5, middle trap 4-/5, flexion 4/5, abduction 4/5, ER 4/5, IR 4+/5 (11/21/2022); 12/29/2022 deferred due to poor interrate reliability.; R lower trap 4/5, R middle trap 4/5, flexion 4+/5, abduction 4+/5, ER 4+/5, IR 5/5 (01/03/2023)    Time 8    Period Weeks    Status Partially Met    Target Date 01/19/23      PT LONG TERM GOAL #4   Title Pt will improve his R shoulder FOTO score by at least 10 points as a  demonstration of improved function.    Baseline R shoulder FOTO 60 (11/21/2022); 12/29/2022: 72    Time 8    Period Weeks    Status Achieved    Target Date 12/29/22              Plan - 01/05/23 0837     Clinical Impression Statement Improved R shoulder AROM after treatment to decrease inferior posterior glenohumeral joint stiffness. Continued working in improving thoracic extension as well as R scapular and shoulder strength to promote better glenohumeral movement when raising his R arm up. Pt tolerated session well without aggravation of symptoms. Pt will benefit from continued skilled physical therapy services to decreased pain, improve ROM, strength and function.    Personal Factors and Comorbidities Age;Time since onset of injury/illness/exacerbation;Comorbidity 2    Comorbidities AAA, hx of basal cell CA    Examination-Activity Limitations Reach Overhead;Lift  Stability/Clinical Decision Making Stable/Uncomplicated    Clinical Decision Making Low    Rehab Potential Fair    PT Frequency 2x / week    PT Duration 8 weeks    PT Treatment/Interventions Therapeutic activities;Neuromuscular re-education;Therapeutic exercise;Patient/family education;Manual techniques;Dry needling;Spinal Manipulations;Joint Manipulations;Electrical Stimulation;Iontophoresis 74m/ml Dexamethasone    PT Next Visit Plan Posture, scapular, shoulder strengthening, stretches, manual techniques, modalities PRN    PT Home Exercise Plan Medbridge Access Code: [email or web address]   Consulted and Agree with Plan of Care Patient              MJoneen BoersPT, DPT   01/05/2023, 1:15 PM

## 2023-01-10 ENCOUNTER — Ambulatory Visit: Payer: Medicare Other

## 2023-01-10 DIAGNOSIS — M25511 Pain in right shoulder: Secondary | ICD-10-CM | POA: Diagnosis not present

## 2023-01-10 DIAGNOSIS — G8929 Other chronic pain: Secondary | ICD-10-CM

## 2023-01-10 DIAGNOSIS — M25611 Stiffness of right shoulder, not elsewhere classified: Secondary | ICD-10-CM | POA: Diagnosis not present

## 2023-01-10 NOTE — Therapy (Signed)
OUTPATIENT PHYSICAL THERAPY TREATMENT NOTE   Patient Name: Patrick Sawyer MRN: [id number] DOB:1941-04-09, 82 y.o., male Today's Date: 01/10/2023  PCP: Ria Bush, MD  REFERRING PROVIDER: Owens Loffler, MD    END OF SESSION:  PT End of Session - 01/10/23 0844     Visit Number 13    Number of Visits 17    Date for PT Re-Evaluation 01/19/23    Progress Note Due on Visit 28    PT Start Time 0842    PT Stop Time 0926    PT Time Calculation (min) 44 min    Activity Tolerance Patient tolerated treatment well    Behavior During Therapy [facility] for tasks assessed/performed                     Past Medical History:  Diagnosis Date   AAA (abdominal aortic aneurysm) ([place])    stable over last 3 yrs   Alcohol use longstanding   quits cold [place] for lent   Allergy    seasonal   Basal cell carcinoma 03/03/2014   left posterior ear, superficial    Cervical arthritis    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    Dysplastic nevus 07/24/2009   left thigh    Hearing loss of both ears    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, 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   HLD (hyperlipidemia)    Obesity, Class I, BMI 30-34.9    Seasonal allergies    Traumatic enucleation of left eye    Past Surgical History:  Procedure Laterality Date   APPENDECTOMY  1959   COLONOSCOPY  01/2015   small polyps, rpt 5 yrs Tiffany Kocher)   COLONOSCOPY  06/2012   small polyps Vira Agar)   COLONOSCOPY  02/2020   1 HP, diverticulosis, int hem (Pyrtle)   ENUCLEATION Left 1969   HEMORRHOID BANDING  04/2015   Dr Carilyn Goodpasture HERNIA REPAIR Right    MENISCUS REPAIR Left 2005   [place]  04/2014   [place]  2006   Patient Active Problem List   Diagnosis Date Noted   Chronic right shoulder pain 09/20/2022   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   Skin lump of leg, left 06/14/2021   Varicose veins of both lower extremities without ulcer or inflammation 07/28/2020    Medicare annual wellness visit, subsequent 07/26/2019   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 Started about May 2023 and has improved substantially since then. Almost has full ROM now. Has not yet had PT for his shoulder. Was given a handout for different exercises (pendulums, standing R shoulder flexion at wall, behind the back R shoulder IR stretch). No neck pain. Pt is R hand dominant.   PRECAUTIONS: No known precautions   SUBJECTIVE:   SUBJECTIVE STATEMENT: R shoulder is good, no pain. Feels a little grab when he reaches up.   PAIN:  Are you having pain?  See subjective.     TODAY'S TREATMENT:                                                                                                                                         DATE: 01/10/2023  No latex allergies No blood pressure problems per pt.    The L posterior ear basal cell CA was removed. 2015  Last PT session 01/12/2023  Manual therapy  Seated with R arm propped in abduction on table  Posterior inferior shoulder joint glide grade 3+ to 4 to promote joint mobility and decrease stiffness  Then while maintaining the aforementioned pressure:    mobilization with movement with R shoulder ER 10x    Then with yellow band resistance 10x3   Therapeutic Exercise  R shoulder AROM at start of session  Flexion: 131 degrees  Abduction: 140 degrees  Standing RUE ranger:    Flexion 10x5 seconds for 2 sets   Scaption 10x5 seconds for 2 sets   Abduction 10x5 seconds for 2 sets  Scap retractions at OMEGA: 3x8, 45 lbs    Prone R lower trap raise 10x5 seconds,      Improved exercise technique, movement at target joints, use of target muscles after mod verbal, visual, tactile cues.           Response to treatment Pt tolerated session well without aggravation of symptoms.         Clinical impression  Improved R shoulder AROM starting levels compared to previous sessions. Continued working in improving R shoulder joint mobility as well as R scapular and shoulder strength to promote better glenohumeral movement when raising his R arm up. Pt tolerated session well without aggravation of symptoms. Pt will benefit from continued skilled physical therapy services to decrease pain, improve ROM, strength and function.         PATIENT EDUCATION: Education details: there-ex, HEP Person educated: Patient Education method: Explanation, Demonstration, Tactile cues, Verbal cues, and Handouts Education comprehension: verbalized understanding and returned demonstration  HOME EXERCISE PROGRAM: Home Exercise program Access Code: [email or web address] URL: [email or web address] Date: 11/21/2022 Prepared by: Joneen Boers   Exercises - Single Arm Doorway Pec Stretch at 60 Elevation  - 3 x daily - 7 x weekly - 1 sets - 3 reps - 30 seconds hold - Seated Shoulder Inferior Glide  - 1 x daily - 7 x weekly - 3 sets - 10 reps - 5 seconds hold  - Isometric Shoulder Internal Rotation  - 1 x daily - 7 x weekly - 3 sets - 10 reps - 5 seconds hold - Shoulder External Rotation and Scapular Retraction with Resistance  - 1 x daily - 7 x weekly - 3 sets - 10 reps - Seated Passive Cervical Retraction  - 1 x daily - 7 x weekly - 3 sets - 10 reps - 5 seconds hold - Supine Shoulder Scaption with Dowel  -  2 x daily - 7 x weekly - 3 sets - 10 reps - 5 seconds hold - Supine  Shoulder Flexion PROM  - 2 x daily - 7 x weekly - 3 sets - 10 reps - 5 seconds hold     PT Short Term Goals - 12/29/22 0855       PT SHORT TERM GOAL #1   Title Pt will be independent with his initial HEP to improve ROM, strength, and function.    Baseline Pt has started his HEP (11/21/2022); 12/29/2022: completes HEP intermittently. Mainly focuses on gym work outs 3x/week    Time 3    Period Weeks    Status On-going    Target Date 01/12/23              PT Long Term Goals - 01/03/23 0849       PT LONG TERM GOAL #1   Title Pt will have a decrease in R shoulder pain to 1/10 or less at worst to promote ability to reach more comfortably.    Baseline 3/10 R shoulder pain at worst for the past 3 months (11/21/2022); 12/29/2022: denies pain with R shoulder, reports discomfort at end ranges.    Time 8    Period Weeks    Status Achieved    Target Date 12/29/22      PT LONG TERM GOAL #2   Title Pt will improve R shoulder flexion, AROM to 140 at least degrees, abduction to at least 160 degrees to promote ability to reach more comfortably.    Baseline R shoulder AROM 112 degrees flexion, 116 degrees abduction (11/21/2022); R shoulder flexion:130 degrees , R shoulder abduction: 135 degrees abduction    Time 8    Period Weeks    Status On-going    Target Date 01/19/23      PT LONG TERM GOAL #3   Title Pt will improve his R shoulder strength by at least 1/2 MMT to promote ability to lift items to a shelf, reach, pick up something from the floor more comfortably.    Baseline R lower trap 4/5, middle trap 4-/5, flexion 4/5, abduction 4/5, ER 4/5, IR 4+/5 (11/21/2022); 12/29/2022 deferred due to poor interrate reliability.; R lower trap 4/5, R middle trap 4/5, flexion 4+/5, abduction 4+/5, ER 4+/5, IR 5/5 (01/03/2023)    Time 8    Period Weeks    Status Partially Met    Target Date 01/19/23      PT LONG TERM GOAL #4   Title Pt will improve his R shoulder FOTO score by at least 10 points as a  demonstration of improved function.    Baseline R shoulder FOTO 60 (11/21/2022); 12/29/2022: 72    Time 8    Period Weeks    Status Achieved    Target Date 12/29/22              Plan - 01/10/23 0841     Clinical Impression Statement Improved R shoulder AROM starting levels compared to previous sessions. Continued working in improving R shoulder joint mobility as well as R scapular and shoulder strength to promote better glenohumeral movement when raising his R arm up. Pt tolerated session well without aggravation of symptoms. Pt will benefit from continued skilled physical therapy services to decrease pain, improve ROM, strength and function.    Personal Factors and Comorbidities Age;Time since onset of injury/illness/exacerbation;Comorbidity 2    Comorbidities AAA, hx of basal cell CA    Examination-Activity Limitations Reach  Overhead;Lift    Stability/Clinical Decision Making Stable/Uncomplicated    Rehab Potential Fair    PT Frequency 2x / week    PT Duration 8 weeks    PT Treatment/Interventions Therapeutic activities;Neuromuscular re-education;Therapeutic exercise;Patient/family education;Manual techniques;Dry needling;Spinal Manipulations;Joint Manipulations;Electrical Stimulation;Iontophoresis '4mg'$ /ml Dexamethasone    PT Next Visit Plan Posture, scapular, shoulder strengthening, stretches, manual techniques, modalities PRN    PT Home Exercise Plan Medbridge Access Code: [email or web address]    Consulted and Agree with Plan of Care Patient              Joneen Boers PT, DPT   01/10/2023, 12:09 PM

## 2023-01-12 ENCOUNTER — Ambulatory Visit: Payer: Medicare Other

## 2023-01-12 DIAGNOSIS — M25611 Stiffness of right shoulder, not elsewhere classified: Secondary | ICD-10-CM | POA: Diagnosis not present

## 2023-01-12 DIAGNOSIS — G8929 Other chronic pain: Secondary | ICD-10-CM | POA: Diagnosis not present

## 2023-01-12 DIAGNOSIS — M25511 Pain in right shoulder: Secondary | ICD-10-CM | POA: Diagnosis not present

## 2023-01-12 NOTE — Therapy (Signed)
OUTPATIENT PHYSICAL THERAPY TREATMENT NOTE And Discharge Summary     Patient Name: Patrick Sawyer MRN: [id number] DOB:June 14, 1941, 82 y.o., male Today's Date: 01/12/2023  PCP: Ria Bush, MD  REFERRING PROVIDER: Owens Loffler, MD    END OF SESSION:  PT End of Session - 01/12/23 0848     Visit Number 14    Number of Visits 17    Date for PT Re-Evaluation 01/19/23    Progress Note Due on Visit 46    PT Start Time 0848    PT Stop Time 0931    PT Time Calculation (min) 43 min    Activity Tolerance Patient tolerated treatment well    Behavior During Therapy [facility] for tasks assessed/performed                      Past Medical History:  Diagnosis Date   AAA (abdominal aortic aneurysm) ([place])    stable over last 3 yrs   Alcohol use longstanding   quits cold [place] for lent   Allergy    seasonal   Basal cell carcinoma 03/03/2014   left posterior ear, superficial    Cervical arthritis    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    Dysplastic nevus 07/24/2009   left thigh    Hearing loss of both ears    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, 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   HLD (hyperlipidemia)    Obesity, Class I, BMI 30-34.9    Seasonal allergies    Traumatic enucleation of left eye    Past Surgical History:  Procedure Laterality Date   APPENDECTOMY  1959   COLONOSCOPY  01/2015   small polyps, rpt 5 yrs Tiffany Kocher)   COLONOSCOPY  06/2012   small polyps Vira Agar)   COLONOSCOPY  02/2020   1 HP, diverticulosis, int hem ([place])   ENUCLEATION Left 1969   HEMORRHOID BANDING  04/2015   Dr Carilyn Goodpasture HERNIA REPAIR Right    MENISCUS REPAIR Left 2005   [place] SURGERY  04/2014   [place]  2006   Patient Active Problem List   Diagnosis Date Noted   Chronic right shoulder pain 09/20/2022   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   Skin lump of leg, left 06/14/2021   Varicose veins of both lower extremities without  ulcer or inflammation 07/28/2020   Medicare annual wellness visit, subsequent 07/26/2019   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 Started about May 2023 and has improved substantially since then. Almost has full ROM now. Has not yet had PT for his shoulder. Was given a handout for different exercises (pendulums, standing R shoulder flexion at wall, behind the back R shoulder IR stretch). No neck pain. Pt is R hand dominant.   PRECAUTIONS: No known precautions   SUBJECTIVE:   SUBJECTIVE STATEMENT: R shoulder is good, no pain.  PAIN:  Are you having pain? See subjective.  TODAY'S TREATMENT:                                                                                                                                         DATE: 01/12/2023  No latex allergies No blood pressure problems per pt.    The L posterior ear basal cell CA was removed. 2015  Last PT session 01/12/2023  Manual therapy  Seated with R arm propped in abduction on table  Posterior inferior shoulder joint glide grade 3+ to 4 to promote joint mobility and decrease stiffness  Then while maintaining the aforementioned pressure:    mobilization with movement with R shoulder ER with yellow band resistance 10x2   Therapeutic Exercise  R shoulder AROM at start of session  Flexion: 130 degrees  Abduction: 140 degrees  Standing RUE ranger:   Flexion 10x5 seconds  for 2 sets   Scaption 10x5 seconds for 2 sets   Abduction 10x5 seconds for 2 sets   Prone R lower trap raise 10x5 seconds for 2 sets  Supine B shoulder horizontal abduction 10x5 seconds to promote pectoralis stretch and thoracic extension.    Improved exercise technique, movement at target joints, use of target muscles after mod verbal, visual, tactile cues.           Response to treatment Pt tolerated session well without aggravation of symptoms.         Clinical impression  Pt demonstrates overall decreased R shoulder pain, improved R shoulder AROM, strength, and function since initial evaluation. Pt has made very good progress with PT towards goals. Skilled physical therapy services discharged with pt continuing progress with his exercises at home.        Improved R shoulder AROM starting levels compared to previous sessions. Continued working in improving R shoulder joint mobility as well as R scapular and shoulder strength to promote better glenohumeral movement when raising his R arm up. Pt tolerated session well without aggravation of symptoms. Pt will benefit from continued skilled physical therapy services to decrease pain, improve ROM, strength and function.         PATIENT EDUCATION: Education details: there-ex, HEP Person educated: Patient Education method: Explanation, Demonstration, Tactile cues, Verbal cues, and Handouts Education comprehension: verbalized understanding and returned demonstration  HOME EXERCISE PROGRAM: Home Exercise program Access Code: [email or web address] URL: [email or web address] Date: 11/21/2022 Prepared by: Joneen Boers   Exercises - Single Arm Doorway Pec Stretch at 60 Elevation  - 3 x daily - 7 x weekly - 1 sets - 3 reps - 30 seconds hold - Seated Shoulder Inferior Glide  - 1 x daily - 7 x weekly - 3 sets - 10 reps - 5 seconds hold  - Isometric Shoulder Internal Rotation  - 1 x daily - 7 x weekly - 3 sets - 10 reps - 5  seconds hold - Shoulder External Rotation and Scapular Retraction with Resistance  -  1 x daily - 7 x weekly - 3 sets - 10 reps - Seated Passive Cervical Retraction  - 1 x daily - 7 x weekly - 3 sets - 10 reps - 5 seconds hold - Supine Shoulder Scaption with Dowel  - 2 x daily - 7 x weekly - 3 sets - 10 reps - 5 seconds hold - Supine Shoulder Flexion PROM  - 2 x daily - 7 x weekly - 3 sets - 10 reps - 5 seconds hold     PT Short Term Goals - 01/12/23 [id number]       PT SHORT TERM GOAL #1   Title Pt will be independent with his initial HEP to improve ROM, strength, and function.    Baseline Pt has started his HEP (11/21/2022); 12/29/2022: completes HEP intermittently. Mainly focuses on gym work outs 3x/week; Performing his HEP, no questions (01/12/2023)    Time 3    Period Weeks    Status Achieved    Target Date 01/12/23              PT Long Term Goals - 01/12/23 0904       PT LONG TERM GOAL #1   Title Pt will have a decrease in R shoulder pain to 1/10 or less at worst to promote ability to reach more comfortably.    Baseline 3/10 R shoulder pain at worst for the past 3 months (11/21/2022); 12/29/2022: denies pain with R shoulder, reports discomfort at end ranges.    Time 8    Period Weeks    Status Achieved    Target Date 12/29/22      PT LONG TERM GOAL #2   Title Pt will improve R shoulder flexion, AROM to 140 at least degrees, abduction to at least 160 degrees to promote ability to reach more comfortably.    Baseline R shoulder AROM 112 degrees flexion, 116 degrees abduction (11/21/2022); R shoulder flexion:130 degrees , R shoulder abduction: 135 degrees abduction; (01/12/2023) R shoulder flexion 130 degrees, R shoulder abduction 140 degrees    Time 8    Period Weeks    Status Partially Met    Target Date 01/19/23      PT LONG TERM GOAL #3   Title Pt will improve his R shoulder strength by at least 1/2 MMT to promote ability to lift items to a shelf, reach, pick up something from the  floor more comfortably.    Baseline R lower trap 4/5, middle trap 4-/5, flexion 4/5, abduction 4/5, ER 4/5, IR 4+/5 (11/21/2022); 12/29/2022 deferred due to poor interrate reliability.; R lower trap 4/5, R middle trap 4/5, flexion 4+/5, abduction 4+/5, ER 4+/5, IR 5/5 (01/03/2023); R lower trap 4+/5 (01/12/2023)    Time 8    Period Weeks    Status Achieved    Target Date 01/19/23      PT LONG TERM GOAL #4   Title Pt will improve his R shoulder FOTO score by at least 10 points as a demonstration of improved function.    Baseline R shoulder FOTO 60 (11/21/2022); 12/29/2022: 72; 70 (01/12/2023)    Time 8    Period Weeks    Status Achieved    Target Date 12/29/22              Plan - 01/12/23 0848     Clinical Impression Statement Pt demonstrates overall decreased R shoulder pain, improved R shoulder AROM, strength, and function since initial evaluation. Pt has made very good progress  with PT towards goals. Skilled physical therapy services discharged with pt continuing progress with his exercises at home.    Personal Factors and Comorbidities Age;Time since onset of injury/illness/exacerbation;Comorbidity 2    Comorbidities AAA, hx of basal cell CA    Examination-Activity Limitations Reach Overhead;Lift    Stability/Clinical Decision Making Stable/Uncomplicated    Clinical Decision Making Low    Rehab Potential --    PT Frequency --    PT Duration --    PT Treatment/Interventions Therapeutic activities;Neuromuscular re-education;Therapeutic exercise;Patient/family education;Manual techniques    PT Next Visit Plan Continue progress with his exercises at home    PT [place] Access Code: [email or web address]    Consulted and Agree with Plan of Care Patient             Thank you for your referral.  Joneen Boers PT, DPT   01/12/2023, 2:16 PM

## 2023-01-13 ENCOUNTER — Encounter: Payer: Self-pay | Admitting: Dermatology

## 2023-02-16 ENCOUNTER — Encounter: Payer: Self-pay | Admitting: Dermatology

## 2023-02-16 ENCOUNTER — Ambulatory Visit (INDEPENDENT_AMBULATORY_CARE_PROVIDER_SITE_OTHER): Payer: Medicare Other | Admitting: Dermatology

## 2023-02-16 VITALS — BP 129/74

## 2023-02-16 DIAGNOSIS — Z79899 Other long term (current) drug therapy: Secondary | ICD-10-CM

## 2023-02-16 DIAGNOSIS — B353 Tinea pedis: Secondary | ICD-10-CM | POA: Diagnosis not present

## 2023-02-16 DIAGNOSIS — B351 Tinea unguium: Secondary | ICD-10-CM | POA: Diagnosis not present

## 2023-02-16 MED ORDER — TERBINAFINE HCL 250 MG PO TABS: 250.0000 mg | ORAL_TABLET | Freq: Every day | ORAL | 1 refills | Status: DC

## 2023-02-16 NOTE — Patient Instructions (Addendum)
Terbinafine Counseling  Terbinafine is an anti-fungal medicine that can be applied to the skin (over the counter) or taken by mouth (prescription) to treat fungal infections. The pill version is often used to treat fungal infections of the nails or scalp. While most people do not have any side effects from taking terbinafine pills, some possible side effects of the medicine can include taste changes, headache, loss of smell, vision changes, nausea, vomiting, or diarrhea.   Rare side effects can include irritation of the liver, allergic reaction, or decrease in blood counts (which may show up as not feeling well or developing an infection). If you are concerned about any of these side effects, please stop the medicine and call your doctor, or in the case of an emergency such as feeling very unwell, seek immediate medical care.    Due to recent changes in healthcare laws, you may see results of your pathology and/or laboratory studies on MyChart before the doctors have had a chance to review them. We understand that in some cases there may be results that are confusing or concerning to you. Please understand that not all results are received at the same time and often the doctors may need to interpret multiple results in order to provide you with the best plan of care or course of treatment. Therefore, we ask that you please give us 2 business days to thoroughly review all your results before contacting the office for clarification. Should we see a critical lab result, you will be contacted sooner.   If You Need Anything After Your Visit  If you have any questions or concerns for your doctor, please call our main line at [phone] and press option 4 to reach your doctor's medical assistant. If no one answers, please leave a voicemail as directed and we will return your call as soon as possible. Messages left after 4 pm will be answered the following business day.   You may also send us a message via  MyChart. We typically respond to MyChart messages within 1-2 business days.  For prescription refills, please ask your pharmacy to contact our office. Our fax number is [phone].  If you have an urgent issue when the clinic is closed that cannot wait until the next business day, you can page your doctor at the number below.    Please note that while we do our best to be available for urgent issues outside of office hours, we are not available 24/7.   If you have an urgent issue and are unable to reach us, you may choose to seek medical care at your doctor's office, retail clinic, urgent care center, or emergency room.  If you have a medical emergency, please immediately call 911 or go to the emergency department.  Pager Numbers  - Dr. Kowalski: [phone]  - Dr. Moye: [phone]  - Dr. Stewart: [phone]  In the event of inclement weather, please call our main line at [phone] for an update on the status of any delays or closures.  Dermatology Medication Tips: Please keep the boxes that topical medications come in in order to help keep track of the instructions about where and how to use these. Pharmacies typically print the medication instructions only on the boxes and not directly on the medication tubes.   If your medication is too expensive, please contact our office at [phone] option 4 or send us a message through MyChart.   We are unable to tell what your co-pay for medications will be   in advance as this is different depending on your insurance coverage. However, we may be able to find a substitute medication at lower cost or fill out paperwork to get insurance to cover a needed medication.   If a prior authorization is required to get your medication covered by your insurance company, please allow us 1-2 business days to complete this process.  Drug prices often vary depending on where the prescription is filled and some pharmacies may offer cheaper  prices.  The website www.goodrx.com contains coupons for medications through different pharmacies. The prices here do not account for what the cost may be with help from insurance (it may be cheaper with your insurance), but the website can give you the price if you did not use any insurance.  - You can print the associated coupon and take it with your prescription to the pharmacy.  - You may also stop by our office during regular business hours and pick up a GoodRx coupon card.  - If you need your prescription sent electronically to a different pharmacy, notify our office through [place] MyChart or by phone at [phone] option 4.     Si Usted Necesita Algo Despus de Su Visita  Tambin puede enviarnos un mensaje a travs de MyChart. Por lo general respondemos a los mensajes de MyChart en el transcurso de 1 a 2 das hbiles.  Para renovar recetas, por favor pida a su farmacia que se ponga en contacto con nuestra oficina. Nuestro nmero de fax es el [phone].  Si tiene un asunto urgente cuando la clnica est cerrada y que no puede esperar hasta el siguiente da hbil, puede llamar/localizar a su doctor(a) al nmero que aparece a continuacin.   Por favor, tenga en cuenta que aunque hacemos todo lo posible para estar disponibles para asuntos urgentes fuera del horario de oficina, no estamos disponibles las 24 horas del da, los 7 das de la semana.   Si tiene un problema urgente y no puede comunicarse con nosotros, puede optar por buscar atencin mdica  en el consultorio de su doctor(a), en una clnica privada, en un centro de atencin urgente o en una sala de emergencias.  Si tiene una emergencia mdica, por favor llame inmediatamente al 911 o vaya a la sala de emergencias.  Nmeros de bper  - Dr. Kowalski: [phone]  - Dra. Moye: [phone]  - Dra. Stewart: [phone]  En caso de inclemencias del tiempo, por favor llame a nuestra lnea principal al [phone]  para una actualizacin sobre el estado de cualquier retraso o cierre.  Consejos para la medicacin en dermatologa: Por favor, guarde las cajas en las que vienen los medicamentos de uso tpico para ayudarle a seguir las instrucciones sobre dnde y cmo usarlos. Las farmacias generalmente imprimen las instrucciones del medicamento slo en las cajas y no directamente en los tubos del medicamento.   Si su medicamento es muy caro, por favor, pngase en contacto con nuestra oficina llamando al [phone] y presione la opcin 4 o envenos un mensaje a travs de MyChart.   No podemos decirle cul ser su copago por los medicamentos por adelantado ya que esto es diferente dependiendo de la cobertura de su seguro. Sin embargo, es posible que podamos encontrar un medicamento sustituto a menor costo o llenar un formulario para que el seguro cubra el medicamento que se considera necesario.   Si se requiere una autorizacin previa para que su compaa de seguros cubra su medicamento, por favor permtanos de 1 a   2 das hbiles para completar este proceso.  Los precios de los medicamentos varan con frecuencia dependiendo del lugar de dnde se surte la receta y alguna farmacias pueden ofrecer precios ms baratos.  El sitio web www.goodrx.com tiene cupones para medicamentos de diferentes farmacias. Los precios aqu no tienen en cuenta lo que podra costar con la ayuda del seguro (puede ser ms barato con su seguro), pero el sitio web puede darle el precio si no utiliz ningn seguro.  - Puede imprimir el cupn correspondiente y llevarlo con su receta a la farmacia.  - Tambin puede pasar por nuestra oficina durante el horario de atencin regular y recoger una tarjeta de cupones de GoodRx.  - Si necesita que su receta se enve electrnicamente a una farmacia diferente, informe a nuestra oficina a travs de MyChart de [place] o por telfono llamando al [phone] y presione la opcin 4.  

## 2023-02-16 NOTE — Progress Notes (Signed)
   Follow-Up Visit   Subjective  Patrick Sawyer is a 82 y.o. 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  Objective  Well appearing patient in no apparent distress; mood and affect are within normal limits. A focused examination was performed of the following areas: Bil feet toenails  Relevant exam findings are noted in the Assessment and Plan.   Assessment & Plan   TINEA UNGUIUM Exam: Thickened toenails with subungal debris c/w onychomycosis  Chronic and persistent condition with duration or expected duration over one year. Condition is symptomatic/ bothersome to patient. Not currently at goal.  Treatment Plan: Recommend 2 more months of Lamisil  Cont Lamisil 250mg  1 po qd #30, 1rf  Long-term use of high-risk medication  Medication management  Tinea pedis of both feet  Tinea unguium  Terbinafine Counseling  Terbinafine is an anti-fungal medicine that can be applied to the skin (over the counter) or taken by mouth (prescription) to treat fungal infections. The pill version is often used to treat fungal infections of the nails or scalp. While most people do not have any side effects from taking terbinafine pills, some possible side effects of the medicine can include taste changes, headache, loss of smell, vision changes, nausea, vomiting, or diarrhea.   Rare side effects can include irritation of the liver, allergic reaction, or decrease in blood counts (which may show up as not feeling well or developing an infection). If you are concerned about any of these side effects, please stop the medicine and call your doctor, or in the case of an emergency such as feeling very unwell, seek  immediate medical care.    Long term medication management.  Patient is using long term (months to years) prescription medication  to control their dermatologic condition.  These medications require periodic monitoring to evaluate for efficacy and side effects and may require periodic laboratory monitoring.  Return in about 10 months (around 12/19/2023) for TBSE, Hx of BCC, Hx of Dysplastic nevi, Hx of AKs.  I, Ardis Rowan, RMA, am acting as scribe for Armida Sans, MD .   Documentation: I have reviewed the above documentation for accuracy and completeness, and I agree with the above.  Armida Sans, MD

## 2023-02-26 ENCOUNTER — Encounter: Payer: Self-pay | Admitting: Dermatology

## 2023-03-13 DIAGNOSIS — H2511 Age-related nuclear cataract, right eye: Secondary | ICD-10-CM | POA: Diagnosis not present

## 2023-03-13 DIAGNOSIS — H40001 Preglaucoma, unspecified, right eye: Secondary | ICD-10-CM | POA: Diagnosis not present

## 2023-03-13 DIAGNOSIS — H353112 Nonexudative age-related macular degeneration, right eye, intermediate dry stage: Secondary | ICD-10-CM | POA: Diagnosis not present

## 2023-03-13 DIAGNOSIS — Q111 Other anophthalmos: Secondary | ICD-10-CM | POA: Diagnosis not present

## 2023-03-14 ENCOUNTER — Ambulatory Visit (INDEPENDENT_AMBULATORY_CARE_PROVIDER_SITE_OTHER): Payer: Medicare Other | Admitting: Family Medicine

## 2023-03-14 ENCOUNTER — Encounter: Payer: Self-pay | Admitting: Family Medicine

## 2023-03-14 VITALS — BP 136/78 | HR 57 | Temp 97.4°F | Ht 71.0 in | Wt 232.1 lb

## 2023-03-14 DIAGNOSIS — H6121 Impacted cerumen, right ear: Secondary | ICD-10-CM | POA: Diagnosis not present

## 2023-03-14 NOTE — Progress Notes (Signed)
Ph: [phone]       Fax: [phone]   Patient ID: Patrick Sawyer, male    DOB: Sep 12, 1941, 82 y.o.   MRN: [id number]  This visit was conducted in person.  BP 136/78   Pulse (!) 57   Temp (!) 97.4 F (36.3 C) (Temporal)   Ht 5\' 11"  (1.803 m)   Wt 232 lb 2 oz (105.3 kg)   SpO2 96%   BMI 32.37 kg/m    CC: check ear wax Subjective:   HPI: Patrick Sawyer is a 82 y.o. male presenting on 03/14/2023 for Cerumen Impaction (C/o a lot of earwax in R ear. Started  03/10/23. Denies any pain. )   Several day h/o R ear hearing impairment due to cerumen impaction.  Tried OTC ear drops without benefit.  No ear pain, drainage.  No nausea, dizziness.  No significant URI symptoms, fevers/chills.   Notes some congestion attributed to allergies.   Chronic tinnitus.  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      Relevant past medical, surgical, family and social history reviewed and updated as indicated. Interim medical history since our last visit reviewed. Allergies and medications reviewed and updated. Outpatient Medications Prior to Visit  Medication Sig Dispense Refill   aspirin 81 MG EC tablet Take 81 mg by mouth daily. Swallow whole.     cyanocobalamin (VITAMIN B12) 1000 MCG tablet Take 1 tablet (1,000 mcg total) by mouth 2 (two) times a week.     EPINEPHrine (EPIPEN 2-PAK) 0.3 mg/0.3 mL IJ SOAJ injection Inject 0.3 mg into the muscle as needed for anaphylaxis. 1 each 1   glucosamine-chondroitin 500-400 MG tablet Take 2 tablets by mouth daily.     Multiple Vitamins-Minerals (MULTIVITAMIN ADULT PO) Take 1 capsule by mouth daily.     Omega-3 Fatty Acids (FISH OIL) 1000 MG CAPS Take 1 capsule by mouth daily.     terbinafine (LAMISIL) 250 MG tablet Take 1 tablet (250 mg total) by mouth daily. 1 po qd for tinea unguium 30 tablet 1   No facility-administered medications prior to visit.     Per HPI unless specifically indicated in ROS section below Review of  Systems  Objective:  BP 136/78   Pulse (!) 57   Temp (!) 97.4 F (36.3 C) (Temporal)   Ht 5\' 11"  (1.803 m)   Wt 232 lb 2 oz (105.3 kg)   SpO2 96%   BMI 32.37 kg/m   Wt Readings from Last 3 Encounters:  03/14/23 232 lb 2 oz (105.3 kg)  10/24/22 237 lb 4 oz (107.6 kg)  09/20/22 232 lb (105.2 kg)      Physical Exam Vitals and nursing note reviewed.  Constitutional:      Appearance: Normal appearance. He is not ill-appearing.  HENT:     Head: Normocephalic and atraumatic.     Right Ear: There is impacted cerumen.     Left Ear: There is impacted cerumen.     Nose: Nose normal.     Mouth/Throat:     Mouth: Mucous membranes are moist.     Pharynx: Oropharynx is clear. No oropharyngeal exudate or posterior oropharyngeal erythema.  Eyes:     Extraocular Movements: Extraocular movements intact.     Conjunctiva/sclera: Conjunctivae normal.     Pupils: Pupils are equal, round, and reactive to light.  Neurological:     Mental Status: He is alert.  Psychiatric:        Mood and Affect: Mood normal.  Behavior: Behavior normal.       R ear irrigation: Patient presents with R hearing loss, requesting disimpaction.  Ceruminosis is noted.  Wax is removed by syringing and manual debridement. Instructions for home care provided.  Assessment & Plan:   Problem List Items Addressed This Visit     Hearing loss due to cerumen impaction, right - Primary    Irrigation performed successfully to R ear in office today, with return of normal hearing to that ear.         No orders of the defined types were placed in this encounter.   No orders of the defined types were placed in this encounter.   Patient Instructions  Ear irrigation performed to right ear today.   Follow up plan: Return if symptoms worsen or fail to improve.  Eustaquio Boyden, MD

## 2023-03-14 NOTE — Assessment & Plan Note (Addendum)
Irrigation performed successfully to R ear in office today, with return of normal hearing to that ear.

## 2023-03-14 NOTE — Patient Instructions (Signed)
Ear irrigation performed to right ear today.

## 2023-04-25 DIAGNOSIS — H2511 Age-related nuclear cataract, right eye: Secondary | ICD-10-CM | POA: Diagnosis not present

## 2023-04-25 DIAGNOSIS — H353112 Nonexudative age-related macular degeneration, right eye, intermediate dry stage: Secondary | ICD-10-CM | POA: Diagnosis not present

## 2023-04-25 DIAGNOSIS — H40001 Preglaucoma, unspecified, right eye: Secondary | ICD-10-CM | POA: Diagnosis not present

## 2023-06-14 ENCOUNTER — Ambulatory Visit (INDEPENDENT_AMBULATORY_CARE_PROVIDER_SITE_OTHER): Payer: Medicare Other | Admitting: Dermatology

## 2023-06-14 ENCOUNTER — Encounter: Payer: Self-pay | Admitting: Dermatology

## 2023-06-14 VITALS — BP 139/76 | HR 55

## 2023-06-14 DIAGNOSIS — L821 Other seborrheic keratosis: Secondary | ICD-10-CM

## 2023-06-14 DIAGNOSIS — L578 Other skin changes due to chronic exposure to nonionizing radiation: Secondary | ICD-10-CM | POA: Diagnosis not present

## 2023-06-14 DIAGNOSIS — W908XXA Exposure to other nonionizing radiation, initial encounter: Secondary | ICD-10-CM | POA: Diagnosis not present

## 2023-06-14 DIAGNOSIS — L820 Inflamed seborrheic keratosis: Secondary | ICD-10-CM | POA: Diagnosis not present

## 2023-06-14 NOTE — Progress Notes (Signed)
   Follow-Up Visit   Subjective  Patrick Sawyer is a 82 y.o. male who presents for the following: Spots on arms. Itching at times. Raised, rough. Dur: months.  The patient has spots, moles and lesions to be evaluated, some may be new or changing and the patient may have concern these could be cancer.  The following portions of the chart were reviewed this encounter and updated as appropriate: medications, allergies, medical history  Review of Systems:  No other skin or systemic complaints except as noted in HPI or Assessment and Plan.  Objective  Well appearing patient in no apparent distress; mood and affect are within normal limits. A focused examination was performed of the following areas: Face, arms, hands  Relevant physical exam findings are noted in the Assessment and Plan.  Left Elbow x1, right forearm x1, left cheek x2 (4) Erythematous keratotic or waxy stuck-on papule or plaque.   Assessment & Plan   Inflamed seborrheic keratosis (4) Left Elbow x1, right forearm x1, left cheek x2  Symptomatic, irritating, patient would like treated.  Destruction of lesion - Left Elbow x1, right forearm x1, left cheek x2 (4) Complexity: simple   Destruction method: cryotherapy   Informed consent: discussed and consent obtained   Timeout:  patient name, date of birth, surgical site, and procedure verified Lesion destroyed using liquid nitrogen: Yes   Region frozen until ice ball extended beyond lesion: Yes   Outcome: patient tolerated procedure well with no complications   Post-procedure details: wound care instructions given   Additional details:  Prior to procedure, discussed risks of blister formation, small wound, skin dyspigmentation, or rare scar following cryotherapy. Recommend Vaseline ointment to treated areas while healing.   SEBORRHEIC KERATOSIS - Stuck-on, waxy, tan-brown papules and/or plaques  - Benign-appearing - Discussed benign etiology and prognosis. -  Observe - Call for any changes  ACTINIC DAMAGE - chronic, secondary to cumulative UV radiation exposure/sun exposure over time - diffuse scaly erythematous macules with underlying dyspigmentation - Recommend daily broad spectrum sunscreen SPF 30+ to sun-exposed areas, reapply every 2 hours as needed.  - Recommend staying in the shade or wearing long sleeves, sun glasses (UVA+UVB protection) and wide brim hats (4-inch brim around the entire circumference of the hat). - Call for new or changing lesions.  Return for TBSE As Scheduled.  I, Lawson Radar, CMA, am acting as scribe for Armida Sans, MD.  Documentation: I have reviewed the above documentation for accuracy and completeness, and I agree with the above.  Armida Sans, MD

## 2023-06-14 NOTE — Patient Instructions (Signed)
Cryotherapy Aftercare  Wash gently with soap and water everyday.   Apply Vaseline Jelly daily until healed.   Recommend daily broad spectrum sunscreen SPF 30+ to sun-exposed areas, reapply every 2 hours as needed. Call for new or changing lesions.  Staying in the shade or wearing long sleeves, sun glasses (UVA+UVB protection) and wide brim hats (4-inch brim around the entire circumference of the hat) are also recommended for sun protection.    Seborrheic Keratosis  What causes seborrheic keratoses? Seborrheic keratoses are harmless, common skin growths that first appear during adult life.  As time goes by, more growths appear.  Some people may develop a large number of them.  Seborrheic keratoses appear on both covered and uncovered body parts.  They are not caused by sunlight.  The tendency to develop seborrheic keratoses can be inherited.  They vary in color from skin-colored to gray, brown, or even black.  They can be either smooth or have a rough, warty surface.   Seborrheic keratoses are superficial and look as if they were stuck on the skin.  Under the microscope this type of keratosis looks like layers upon layers of skin.  That is why at times the top layer may seem to fall off, but the rest of the growth remains and re-grows.    Treatment Seborrheic keratoses do not need to be treated, but can easily be removed in the office.  Seborrheic keratoses often cause symptoms when they rub on clothing or jewelry.  Lesions can be in the way of shaving.  If they become inflamed, they can cause itching, soreness, or burning.  Removal of a seborrheic keratosis can be accomplished by freezing, burning, or surgery. If any spot bleeds, scabs, or grows rapidly, please return to have it checked, as these can be an indication of a skin cancer.  Due to recent changes in healthcare laws, you may see results of your pathology and/or laboratory studies on MyChart before the doctors have had a chance to review  them. We understand that in some cases there may be results that are confusing or concerning to you. Please understand that not all results are received at the same time and often the doctors may need to interpret multiple results in order to provide you with the best plan of care or course of treatment. Therefore, we ask that you please give [place] 2 business days to thoroughly review all your results before contacting the office for clarification. Should we see a critical lab result, you will be contacted sooner.   If You Need Anything After Your Visit  If you have any questions or concerns for your doctor, please call our main line at [phone] and press option 4 to reach your doctor's medical assistant. If no one answers, please leave a voicemail as directed and we will return your call as soon as possible. Messages left after 4 pm will be answered the following business day.   You may also send [place] a message via MyChart. We typically respond to MyChart messages within 1-2 business days.  For prescription refills, please ask your pharmacy to contact our office. Our fax number is [phone].  If you have an urgent issue when the clinic is closed that cannot wait until the next business day, you can page your doctor at the number below.    Please note that while we do our best to be available for urgent issues outside of office hours, we are not available 24/7.   If you  have an urgent issue and are unable to reach [place], you may choose to seek medical care at your doctor's office, retail clinic, urgent care center, or emergency room.  If you have a medical emergency, please immediately call 911 or go to the emergency department.  Pager Numbers  - Dr. Gwen Pounds: [phone]  - Dr. Roseanne Reno: [phone]  In the event of inclement weather, please call our main line at [phone] for an update on the status of any delays or closures.  Dermatology Medication Tips: Please keep the boxes that  topical medications come in in order to help keep track of the instructions about where and how to use these. Pharmacies typically print the medication instructions only on the boxes and not directly on the medication tubes.   If your medication is too expensive, please contact our office at [phone] option 4 or send [place] a message through MyChart.   We are unable to tell what your co-pay for medications will be in advance as this is different depending on your insurance coverage. However, we may be able to find a substitute medication at lower cost or fill out paperwork to get insurance to cover a needed medication.   If a prior authorization is required to get your medication covered by your insurance company, please allow [place] 1-2 business days to complete this process.  Drug prices often vary depending on where the prescription is filled and some pharmacies may offer cheaper prices.  The website www.goodrx.com contains coupons for medications through different pharmacies. The prices here do not account for what the cost may be with help from insurance (it may be cheaper with your insurance), but the website can give you the price if you did not use any insurance.  - You can print the associated coupon and take it with your prescription to the pharmacy.  - You may also stop by our office during regular business hours and pick up a GoodRx coupon card.  - If you need your prescription sent electronically to a different pharmacy, notify our office through [facility] or by phone at [phone] option 4.     Si Usted Necesita Algo Despus de Su Visita  Tambin puede enviarnos un mensaje a travs de Clinical cytogeneticist. Por lo general respondemos a los mensajes de MyChart en el transcurso de 1 a 2 das hbiles.  Para renovar recetas, por favor pida a su farmacia que se ponga en contacto con nuestra oficina. Annie Sable de fax es [place] [phone].  Si tiene un asunto urgente cuando la clnica  est cerrada y que no puede esperar hasta el siguiente da hbil, puede llamar/localizar a su doctor(a) al nmero que aparece a continuacin.   Por favor, tenga en cuenta que aunque hacemos todo lo posible para estar disponibles para asuntos urgentes fuera del horario de [place], no estamos disponibles las 24 horas del da, los 7 [address]  [address] de la [place].   Si tiene un problema urgente y no puede comunicarse con nosotros, puede optar por buscar atencin mdica  en el consultorio de su doctor(a), en una clnica privada, en un centro de atencin urgente o en una sala de emergencias.  Si tiene Engineer, drilling, por favor llame inmediatamente al 911 o vaya a la sala de emergencias.  Nmeros de bper  - Dr. Gwen Pounds: [phone]  - Dra. Moye: [phone]  - Dra. Roseanne Reno: [phone]  En caso de inclemencias del [place], por favor llame a nuestra lnea principal al [phone] para una actualizacin [employer]  estado de cualquier retraso o cierre.  Consejos para la medicacin en dermatologa: Por favor, guarde las cajas en las que vienen los medicamentos de uso tpico para ayudarle a seguir las instrucciones sobre dnde y cmo usarlos. Las farmacias generalmente imprimen las instrucciones del medicamento slo en las cajas y no directamente en los tubos del [place].   Si su medicamento es muy caro, por favor, pngase en contacto con Rolm [place] llamando al [phone] y presione la opcin 4 o envenos un mensaje a travs de Clinical cytogeneticist.   No podemos decirle cul ser su copago por los medicamentos por adelantado ya que esto es diferente dependiendo de la cobertura de su seguro. Sin embargo, es posible que podamos encontrar un medicamento sustituto a Audiological scientist un formulario para que el seguro cubra el medicamento que se considera necesario.   Si se requiere una autorizacin previa para que su compaa de seguros [place] su medicamento, por favor permtanos de 1 a 2 das hbiles para  completar [address]  Los precios de los medicamentos varan con frecuencia dependiendo del Environmental consultant de dnde se surte la receta y alguna farmacias pueden ofrecer precios ms baratos.  El sitio web www.goodrx.com tiene cupones para medicamentos de Health and safety inspector. Los precios aqu no tienen en cuenta lo que podra costar con la ayuda del seguro (puede ser ms barato con su seguro), pero el sitio web puede darle el precio si no utiliz Tourist information centre manager.  - Puede imprimir el cupn correspondiente y llevarlo con su receta a la farmacia.  - Tambin puede pasar por nuestra oficina durante el horario de atencin regular y Education officer, museum una tarjeta de cupones de GoodRx.  - Si necesita que su receta se enve electrnicamente a una farmacia diferente, informe a nuestra oficina a travs de MyChart de [place] o por telfono llamando al [phone] y presione la opcin 4.

## 2023-09-12 DIAGNOSIS — Q111 Other anophthalmos: Secondary | ICD-10-CM | POA: Diagnosis not present

## 2023-09-12 DIAGNOSIS — H40001 Preglaucoma, unspecified, right eye: Secondary | ICD-10-CM | POA: Diagnosis not present

## 2023-09-12 DIAGNOSIS — H353112 Nonexudative age-related macular degeneration, right eye, intermediate dry stage: Secondary | ICD-10-CM | POA: Diagnosis not present

## 2023-09-12 DIAGNOSIS — H2511 Age-related nuclear cataract, right eye: Secondary | ICD-10-CM | POA: Diagnosis not present

## 2023-09-13 ENCOUNTER — Ambulatory Visit: Payer: Medicare Other

## 2023-09-13 VITALS — Ht 72.0 in | Wt 230.0 lb

## 2023-09-13 DIAGNOSIS — Z0000 Encounter for general adult medical examination without abnormal findings: Secondary | ICD-10-CM | POA: Diagnosis not present

## 2023-09-13 NOTE — Progress Notes (Signed)
Subjective:   Patrick Sawyer is a 82 y.o. male who presents for Medicare Annual/Subsequent preventive examination.  Visit Complete: Virtual I connected with  Patrick Sawyer on 09/13/23 by a audio enabled telemedicine application and verified that I am speaking with the correct person using two identifiers.  Patient Location: Home  Provider Location: Home Office  I discussed the limitations of evaluation and management by telemedicine. The patient expressed understanding and agreed to proceed.  Vital Signs: Because this visit was a virtual/telehealth visit, some criteria may be missing or patient reported. Any vitals not documented were not able to be obtained and vitals that have been documented are patient reported.  Patient Medicare AWV questionnaire was completed by the patient on 09/13/2023; I have confirmed that all information answered by patient is correct and no changes since this date.  Cardiac Risk Factors include: advanced age (>10men, >15 women);male gender     Objective:    Today's Vitals   09/13/23 0805  Weight: 230 lb (104.3 kg)  Height: 6' (1.829 m)   Body mass index is 31.19 kg/m.     09/13/2023    8:09 AM 11/21/2022    8:51 AM 09/09/2022    9:11 AM 08/14/2022   10:04 AM 09/07/2021    9:09 AM 06/09/2021    9:33 AM 07/12/2018    9:13 AM  Advanced Directives  Does Patient Have a Medical Advance Directive? Yes Yes Yes No Yes No Yes  Type of Estate agent of [place];Living will Healthcare Power of Patrick Sawyer;Living will Healthcare Power of Patrick Sawyer;Living will  Healthcare Power of Patrick Sawyer;Living will  Healthcare Power of Patrick Sawyer;Living will  Does patient want to make changes to medical advance directive? No - Patient declined No - Patient declined No - Patient declined  Yes (MAU/Ambulatory/Procedural Areas - Information given)    Copy of Healthcare Power of Attorney in Chart? Yes - validated most recent copy scanned in  chart (See row information)  Yes - validated most recent copy scanned in chart (See row information)  No - copy requested  No - copy requested  Would patient like information on creating a medical advance directive?    No - Patient declined  No - Patient declined     Current Medications (verified) Outpatient Encounter Medications as of 09/13/2023  Medication Sig   aspirin 81 MG EC tablet Take 81 mg by mouth daily. Swallow whole.   cyanocobalamin (VITAMIN B12) 1000 MCG tablet Take 1 tablet (1,000 mcg total) by mouth 2 (two) times a week.   EPINEPHrine (EPIPEN 2-PAK) 0.3 mg/0.3 mL IJ SOAJ injection Inject 0.3 mg into the muscle as needed for anaphylaxis.   glucosamine-chondroitin 500-400 MG tablet Take 2 tablets by mouth daily.   Multiple Vitamins-Minerals (MULTIVITAMIN ADULT PO) Take 1 capsule by mouth daily.   Omega-3 Fatty Acids (FISH OIL) 1000 MG CAPS Take 1 capsule by mouth daily.   terbinafine (LAMISIL) 250 MG tablet Take 1 tablet (250 mg total) by mouth daily. 1 po qd for tinea unguium   No facility-administered encounter medications on file as of 09/13/2023.    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   HLD (hyperlipidemia)    Obesity, Class I, BMI 30-34.9    Seasonal allergies    Traumatic enucleation of left eye    Past Surgical History:  Procedure Laterality Date   APPENDECTOMY  1959   COLONOSCOPY  01/2015   small polyps, rpt 5 yrs Patrick Sawyer)   COLONOSCOPY  06/2012    small polyps Patrick Sawyer)   COLONOSCOPY  02/2020   1 HP, diverticulosis, int hem (Patrick Sawyer)   ENUCLEATION Left 1969   HEMORRHOID BANDING  04/2015   Dr Patrick Sawyer   INGUINAL HERNIA REPAIR Right    MENISCUS REPAIR Left 2005   MOHS SURGERY  04/2014   VARICOSE VEIN SURGERY  2006   Family History  Problem Relation Age of Onset   Alcohol abuse Father    Alcohol abuse Brother    Colon cancer Brother 5   Arthritis Mother        knee   Cancer Brother        lung (smoker)   Cancer Brother        colon   Hypertension Brother    CAD Brother        CABG (smoker, EtOH)   Diabetes Neg Hx    Colon polyps Neg Hx    Esophageal cancer Neg Hx    Stomach cancer Neg Hx    Rectal cancer Neg Hx    Social History   Socioeconomic History   Marital status: Married    Spouse name: Not on file   Number of children: Not on file   Years of education: Not on file   Highest education level: Not on file  Occupational History   Not on file  Tobacco Use   Smoking status: Former    Current packs/day: 0.00    Types: Cigarettes    Quit date: 11/14/1989    Years since quitting: 33.8   Smokeless tobacco: Never  Vaping Use   Vaping status: Never Used  Substance and Sexual Activity   Alcohol use: Yes    Alcohol/week: 28.0 standard drinks of alcohol    Types: 28 Shots of liquor per week    Comment: bourbon   Drug use: No   Sexual activity: Never  Other Topics Concern   Not on file  Social History Narrative   Lives with wife   Occupation: retired, was Airline pilot   Edu: BS accounting   Activity: gym 3d/wk, stays busy with housework, traveling   Diet: good water, fruits/vegetables daily   Social Determinants of Health   Financial Resource Strain: Low Risk  (09/13/2023)   Overall Financial Resource Strain (CARDIA)    Difficulty of Paying Living Expenses: Not hard at all  Food Insecurity: No Food Insecurity (09/13/2023)   Hunger Vital Sign    Worried About Running Out of Food in the Last Year: Never  true    Ran Out of Food in the Last Year: Never true  Transportation Needs: No Transportation Needs (09/13/2023)   PRAPARE - Administrator, Civil Service (Medical): No    Lack of Transportation (Non-Medical): No  Physical Activity: Sufficiently Active (09/13/2023)   Exercise Vital Sign    Days of Exercise per Week: 3 days    Minutes of Exercise per Session:  90 min  Stress: No Stress Concern Present (09/13/2023)   [employer] of Occupational Health - Occupational Stress Questionnaire    Feeling of Stress : Not at all  Social Connections: Moderately Isolated (09/13/2023)   Social Connection and Isolation Panel [NHANES]    Frequency of Communication with Friends and Family: More than three times a week    Frequency of Social Gatherings with Friends and Family: More than three times a week    Attends Religious Services: Never    Database administrator or Organizations: No    Attends Engineer, structural: Never    Marital Status: Married    Tobacco Counseling Counseling given: Not Answered   Clinical Intake:  Pre-visit preparation completed: Yes  Pain : No/denies pain     Nutritional Risks: None Diabetes: No  How often do you need to have someone help you when you read instructions, pamphlets, or other written materials from your doctor or pharmacy?: 1 - Never  Interpreter Needed?: No  Information entered by :: Renie Ora, LPN   Activities of Daily Living    09/13/2023    8:09 AM  In your present state of health, do you have any difficulty performing the following activities:  Hearing? 0  Vision? 0  Difficulty concentrating or making decisions? 0  Walking or climbing stairs? 0  Dressing or bathing? 0  Doing errands, shopping? 0  Preparing Food and eating ? N  Using the Toilet? N  In the past six months, have you accidently leaked urine? N  Do you have problems with loss of bowel control? N  Managing your Medications? N  Managing  your Finances? N  Housekeeping or managing your Housekeeping? N    Patient Care Team: Eustaquio Boyden, MD as PCP - General (Family Medicine) Deirdre Evener, MD as Consulting Physician (Dermatology) Sherlyn Lees Lavella Hammock, MD as Referring Physician (Ophthalmology) Scot Jun, MD (Inactive) as Consulting Physician (Gastroenterology)  Indicate any recent Medical Services you may have received from other than [facility] providers in the past year (date may be approximate).     Assessment:   This is a routine wellness examination for [place].  Hearing/Vision screen Vision Screening - Comments:: Wears rx glasses - up to date with routine eye exams with  Dr.Brasington    Goals Addressed             This Visit's Progress    Increase physical activity   On track    Starting 07/12/2018, I will continue to exercise for 90-120 minutes 3 days per week.        Depression Screen    09/13/2023    8:08 AM 03/14/2023   11:54 AM 09/09/2022    9:07 AM 09/07/2021    9:15 AM 07/28/2020    8:25 AM 07/26/2019    9:33 AM 07/12/2018    9:13 AM  PHQ 2/9 Scores  PHQ - 2 Score 0 0 0 0 0 0 0  PHQ- 9 Score       0    Fall Risk    09/13/2023    8:06 AM 03/14/2023   11:54 AM 09/09/2022    9:10 AM 09/07/2021    9:13 AM 07/28/2020    8:24 AM  Fall Risk   Falls in the past year? 0 0 0 0 0  Number falls in past yr: 0  0 0   Injury with Fall? 0  0 0   Risk for fall due to : No  Fall Risks  No Fall Risks No Fall Risks   Follow up Falls prevention discussed  Falls prevention discussed Falls prevention discussed     MEDICARE RISK AT HOME: Medicare Risk at Home Any stairs in or around the home?: Yes If so, are there any without handrails?: No Home free of loose throw rugs in walkways, pet beds, electrical cords, etc?: Yes Adequate lighting in your home to reduce risk of falls?: Yes Life alert?: No Use of a cane, walker or w/c?: No Grab bars in the bathroom?: Yes Shower chair or bench in  shower?: Yes Elevated toilet seat or a handicapped toilet?: Yes  TIMED UP AND GO:  Was the test performed?  No    Cognitive Function:    07/12/2018    9:46 AM 07/10/2017    8:20 AM 06/29/2016    9:35 AM  MMSE - Mini Mental State Exam  Orientation to time 5 5 5   Orientation to Place 5 5 5   Registration 3 3 3   Attention/ Calculation 0 0 0  Recall 3 3 3   Language- name 2 objects 0 0 0  Language- repeat 1 1 1   Language- follow 3 step command 3 3 3   Language- read & follow direction 0 0 0  Write a sentence 0 0 0  Copy design 0 0 0  Total score 20 20 20         09/13/2023    8:09 AM 09/09/2022    9:11 AM  6CIT Screen  What Year? 0 points 0 points  What month? 0 points 0 points  What time? 0 points 0 points  Count back from 20 0 points 0 points  Months in reverse 0 points 0 points  Repeat phrase 0 points 0 points  Total Score 0 points 0 points    Immunizations Immunization History  Administered Date(s) Administered   Fluad Quad(high Dose 65+) 07/26/2019, 07/17/2022   Influenza, High Dose Seasonal PF 08/04/2016, 07/13/2020, 08/03/2021   Influenza-Unspecified 07/15/2018   PFIZER Comirnaty(Gray Top)Covid-19 Tri-Sucrose Vaccine 08/09/2022   PFIZER(Purple Top)SARS-COV-2 Vaccination 12/21/2019, 01/15/2020, 09/12/2020   Pfizer Covid-19 Vaccine Bivalent Booster 5yrs & up 08/10/2021   Pfizer(Comirnaty)Fall Seasonal Vaccine 12 years and older 07/30/2023   Pneumococcal Conjugate-13 03/16/2015   Pneumococcal Polysaccharide-23 02/01/2010, 06/29/2016   Td 07/27/2008   Zoster Recombinant(Shingrix) 12/21/2020   Zoster, Live 07/27/2006    TDAP status: Due, Education has been provided regarding the importance of this vaccine. Advised may receive this vaccine at local pharmacy or Health Dept. Aware to provide a copy of the vaccination record if obtained from local pharmacy or Health Dept. Verbalized acceptance and understanding.  Flu Vaccine status: Due, Education has been provided  regarding the importance of this vaccine. Advised may receive this vaccine at local pharmacy or Health Dept. Aware to provide a copy of the vaccination record if obtained from local pharmacy or Health Dept. Verbalized acceptance and understanding.  Pneumococcal vaccine status: Up to date  Covid-19 vaccine status: Completed vaccines  Qualifies for Shingles Vaccine? Yes   Zostavax completed Yes   Shingrix Completed?: Yes  Screening Tests Health Maintenance  Topic Date Due   Zoster Vaccines- Shingrix (2 of 2) 02/15/2021   DTaP/Tdap/Td (3 - Td or Tdap) 11/14/2021   INFLUENZA VACCINE  06/15/2023   COVID-19 Vaccine (7 - 2023-24 season) 09/24/2023   Medicare Annual Wellness (AWV)  09/12/2024   Pneumonia Vaccine 22+ Years old  Completed   HPV VACCINES  Aged Out   Colonoscopy  Discontinued  Hepatitis C Screening  Discontinued    Health Maintenance  Health Maintenance Due  Topic Date Due   Zoster Vaccines- Shingrix (2 of 2) 02/15/2021   DTaP/Tdap/Td (3 - Td or Tdap) 11/14/2021   INFLUENZA VACCINE  06/15/2023    Colorectal cancer screening: No longer required.   Lung Cancer Screening: (Low Dose CT Chest recommended if Age 42-80 years, 20 pack-year currently smoking OR have quit w/in 15years.) does not qualify.   Lung Cancer Screening Referral: n/a  Additional Screening:  Hepatitis C Screening: does not qualify;   Vision Screening: Recommended annual ophthalmology exams for early detection of glaucoma and other disorders of the eye. Is the patient up to date with their annual eye exam?  Yes  Who is the provider or what is the name of the office in which the patient attends annual eye exams? Dr.Brasington  If pt is not established with a provider, would they like to be referred to a provider to establish care? No .   Dental Screening: Recommended annual dental exams for proper oral hygiene   Community Resource Referral / Chronic Care Management: CRR required this visit?  No    CCM required this visit?  No     Plan:     I have personally reviewed and noted the following in the patient's chart:   Medical and social history Use of alcohol, tobacco or illicit drugs  Current medications and supplements including opioid prescriptions. Patient is not currently taking opioid prescriptions. Functional ability and status Nutritional status Physical activity Advanced directives List of other physicians Hospitalizations, surgeries, and ER visits in previous 12 months Vitals Screenings to include cognitive, depression, and falls Referrals and appointments  In addition, I have reviewed and discussed with patient certain preventive protocols, quality metrics, and best practice recommendations. A written personalized care plan for preventive services as well as general preventive health recommendations were provided to patient.     Lorrene Reid, LPN   [id number]/[id number]   After Visit Summary: (MyChart) Due to this being a telephonic visit, the after visit summary with patients personalized plan was offered to patient via MyChart   Nurse Notes: none

## 2023-09-13 NOTE — Patient Instructions (Signed)
Patrick Sawyer , Thank you for taking time to come for your Medicare Wellness Visit. I appreciate your ongoing commitment to your health goals. Please review the following plan we discussed and let me know if I can assist you in the future.   Referrals/Orders/Follow-Ups/Clinician Recommendations: Aim for 30 minutes of exercise or brisk walking, 6-8 glasses of water, and 5 servings of fruits and vegetables each day.   This is a list of the screening recommended for you and due dates:  Health Maintenance  Topic Date Due   Zoster (Shingles) Vaccine (2 of 2) 02/15/2021   DTaP/Tdap/Td vaccine (3 - Td or Tdap) 11/14/2021   Flu Shot  06/15/2023   COVID-19 Vaccine (7 - 2023-24 season) 09/24/2023   Medicare Annual Wellness Visit  09/12/2024   Pneumonia Vaccine  Completed   HPV Vaccine  Aged Out   Colon Cancer Screening  Discontinued   Hepatitis C Screening  Discontinued    Advanced directives: (In Chart) A copy of your advanced directives are scanned into your chart should your provider ever need it.  Next Medicare Annual Wellness Visit scheduled for next year: Yes  insert Preventive Care attachment Insert FALL PREVENTION attachment if needed

## 2023-09-17 ENCOUNTER — Other Ambulatory Visit: Payer: Self-pay | Admitting: Family Medicine

## 2023-09-17 DIAGNOSIS — E785 Hyperlipidemia, unspecified: Secondary | ICD-10-CM

## 2023-09-17 DIAGNOSIS — E538 Deficiency of other specified B group vitamins: Secondary | ICD-10-CM

## 2023-09-18 ENCOUNTER — Other Ambulatory Visit (INDEPENDENT_AMBULATORY_CARE_PROVIDER_SITE_OTHER): Payer: Medicare Other

## 2023-09-18 DIAGNOSIS — E785 Hyperlipidemia, unspecified: Secondary | ICD-10-CM

## 2023-09-18 DIAGNOSIS — E538 Deficiency of other specified B group vitamins: Secondary | ICD-10-CM

## 2023-09-18 LAB — LIPID PANEL
Cholesterol: 190 mg/dL (ref 0–200)
HDL: 64.3 mg/dL (ref >39.00)
LDL Cholesterol: 104 mg/dL — ABNORMAL HIGH (ref 0–99)
NonHDL: 125.59
Total CHOL/HDL Ratio: 3
Triglycerides: 108 mg/dL (ref 0.0–149.0)
VLDL: 21.6 mg/dL (ref 0.0–40.0)

## 2023-09-18 LAB — COMPREHENSIVE METABOLIC PANEL
ALT: 16 U/L (ref 0–53)
AST: 20 U/L (ref 0–37)
Albumin: 4.1 g/dL (ref 3.5–5.2)
Alkaline Phosphatase: 63 U/L (ref 39–117)
BUN: 13 mg/dL (ref 6–23)
CO2: 28 meq/L (ref 19–32)
Calcium: 9 mg/dL (ref 8.4–10.5)
Chloride: 107 meq/L (ref 96–112)
Creatinine, Ser: 0.93 mg/dL (ref 0.40–1.50)
GFR: 77.63 mL/min (ref >60.00)
Glucose, Bld: 84 mg/dL (ref 70–99)
Potassium: 4.3 meq/L (ref 3.5–5.1)
Sodium: 142 meq/L (ref 135–145)
Total Bilirubin: 0.5 mg/dL (ref 0.2–1.2)
Total Protein: 7 g/dL (ref 6.0–8.3)

## 2023-09-18 LAB — CBC WITH DIFFERENTIAL/PLATELET
Basophils Absolute: 0.1 10*3/uL (ref 0.0–0.1)
Basophils Relative: 1.2 % (ref 0.0–3.0)
Eosinophils Absolute: 0.4 10*3/uL (ref 0.0–0.7)
Eosinophils Relative: 7 % — ABNORMAL HIGH (ref 0.0–5.0)
HCT: 44.3 % (ref 39.0–52.0)
Hemoglobin: 14.7 g/dL (ref 13.0–17.0)
Lymphocytes Relative: 38 % (ref 12.0–46.0)
Lymphs Abs: 2.4 10*3/uL (ref 0.7–4.0)
MCHC: 33.3 g/dL (ref 30.0–36.0)
MCV: 102.2 fL — ABNORMAL HIGH (ref 78.0–100.0)
Monocytes Absolute: 0.6 10*3/uL (ref 0.1–1.0)
Monocytes Relative: 9.6 % (ref 3.0–12.0)
Neutro Abs: 2.8 10*3/uL (ref 1.4–7.7)
Neutrophils Relative %: 44.2 % (ref 43.0–77.0)
Platelets: 259 10*3/uL (ref 150.0–400.0)
RBC: 4.33 Mil/uL (ref 4.22–5.81)
RDW: 13 % (ref 11.5–15.5)
WBC: 6.2 10*3/uL (ref 4.0–10.5)

## 2023-09-18 LAB — VITAMIN B12: Vitamin B-12: 1175 pg/mL — ABNORMAL HIGH (ref 211–911)

## 2023-09-25 ENCOUNTER — Ambulatory Visit (INDEPENDENT_AMBULATORY_CARE_PROVIDER_SITE_OTHER): Payer: Medicare Other | Admitting: Family Medicine

## 2023-09-25 ENCOUNTER — Encounter: Payer: Self-pay | Admitting: Family Medicine

## 2023-09-25 VITALS — BP 158/82 | HR 55 | Temp 98.0°F | Ht 71.5 in | Wt 239.0 lb

## 2023-09-25 DIAGNOSIS — I10 Essential (primary) hypertension: Secondary | ICD-10-CM | POA: Insufficient documentation

## 2023-09-25 DIAGNOSIS — E538 Deficiency of other specified B group vitamins: Secondary | ICD-10-CM

## 2023-09-25 DIAGNOSIS — I77811 Abdominal aortic ectasia: Secondary | ICD-10-CM

## 2023-09-25 DIAGNOSIS — R030 Elevated blood-pressure reading, without diagnosis of hypertension: Secondary | ICD-10-CM | POA: Diagnosis not present

## 2023-09-25 DIAGNOSIS — T63481S Toxic effect of venom of other arthropod, accidental (unintentional), sequela: Secondary | ICD-10-CM | POA: Diagnosis not present

## 2023-09-25 DIAGNOSIS — D7589 Other specified diseases of blood and blood-forming organs: Secondary | ICD-10-CM | POA: Diagnosis not present

## 2023-09-25 DIAGNOSIS — T782XXS Anaphylactic shock, unspecified, sequela: Secondary | ICD-10-CM

## 2023-09-25 DIAGNOSIS — E785 Hyperlipidemia, unspecified: Secondary | ICD-10-CM

## 2023-09-25 DIAGNOSIS — M25561 Pain in right knee: Secondary | ICD-10-CM | POA: Diagnosis not present

## 2023-09-25 DIAGNOSIS — Z7189 Other specified counseling: Secondary | ICD-10-CM | POA: Diagnosis not present

## 2023-09-25 DIAGNOSIS — M25562 Pain in left knee: Secondary | ICD-10-CM | POA: Diagnosis not present

## 2023-09-25 MED ORDER — VITAMIN D3 50 MCG (2000 UT) PO CAPS: 2000.0000 [IU] | ORAL_CAPSULE | Freq: Every day | ORAL | Status: AC

## 2023-09-25 MED ORDER — EPINEPHRINE 0.3 MG/0.3ML IJ SOAJ: 0.3000 mg | INTRAMUSCULAR | 1 refills | Status: AC | PRN

## 2023-09-25 MED ORDER — VITAMIN B-12 1000 MCG PO TABS: 1000.0000 ug | ORAL_TABLET | ORAL | Status: AC

## 2023-09-25 MED ORDER — FISH OIL 1000 MG PO CAPS: 1.0000 | ORAL_CAPSULE | Freq: Two times a day (BID) | ORAL | Status: AC

## 2023-09-25 NOTE — Assessment & Plan Note (Signed)
L knee pain present for months, R knee pain present for the past week. Reassuring exam. Anticipate OA flare. He will restart glucosamine, continue fish oil, add vit D 2000 international units daily.  Ok to use diclofenac topical gel PRN He will return for xrays of not improving with above.

## 2023-09-25 NOTE — Assessment & Plan Note (Addendum)
Stable over the last several years.  B12 levels high - will drop dosing to once weekly.  Fmhx MDS - consider periph smear, update folate next labs.

## 2023-09-25 NOTE — Progress Notes (Signed)
Ph: [phone] Fax: [phone]   Patient ID: Patrick Sawyer, male    DOB: 27-Jan-1941, 82 y.o.   MRN: [id number]  This visit was conducted in person.  BP (!) 158/82   Pulse (!) 55   Temp 98 F (36.7 C) (Oral)   Ht 5' 11.5" (1.816 m)   Wt 239 lb (108.4 kg)   SpO2 100%   BMI 32.87 kg/m   166/78 on repeat testing BP Readings from Last 3 Encounters:  09/25/23 (!) 158/82  06/14/23 139/76  03/14/23 136/78    CC: AMW f/u visit Subjective:   HPI: Patrick Sawyer is a 82 y.o. male presenting on 09/25/2023 for Annual Exam (MCR prt 2 [AWV- 09/13/23].)   Saw health advisor last month for medicare wellness visit. Note reviewed.   No results found.  Flowsheet Row Clinical Support from 09/13/2023 in [facility] [place]  PHQ-2 Total Score 0          09/13/2023    8:06 AM 03/14/2023   11:54 AM 09/09/2022    9:10 AM 09/07/2021    9:13 AM 07/28/2020    8:24 AM  Fall Risk   Falls in the past year? 0 0 0 0 0  Number falls in past yr: 0  0 0   Injury with Fall? 0  0 0   Risk for fall due to : No Fall Risks  No Fall Risks No Fall Risks   Follow up Falls prevention discussed  Falls prevention discussed Falls prevention discussed    Bilateral medial knee pain started (L for months, R for the past week). No known triggers, trauma/injury or falls. His younger brother has similar issue, managing with topical diclofenac.   Oldest brother recently diagnosed with myelodysplastic syndrome.   Preventative: COLONOSCOPY 01/2015 benign HP polyp, pt thinks rec rpt 5 yrs [facility]) COLONOSCOPY 02/2020 - 1 HP, diverticulosis, int hem (Pyrtle) Prostate cancer screening - has age out  Lung cancer screening - quit smoking 1991. Not eligible.  Flu shot yearly  COVID vaccine - 12/2019, 01/2020, booster 08/2020, bivalent booster 07/2021, again 03/2022 Td 2009 Pneumovax 06/2016, prevnar 2016 Zostavax 2007 Shingrix - 12/2020, completed will check with  pharmacy  Advanced directive discussion - scanned 09/2021. Wife Jill Side then daughter Bella Kennedy are HCPOA. No prolonged life support if terminal condition.  Seat belt use discussed  Sunscreen use discussed. No changing moles. Sees derm yearly (Dr Gwen Pounds).  Quit smoking 1991  Alcohol - drinking 2 glasses of wine/night. Gives up alcohol for lent.  Dentist Q6 mo  Eye exam - yearly Bowel - no diarrhea/constipation.  Bladder - no incontinence.    Lives with wife Occupation: retired, was Airline pilot Edu: BS accounting Activity: exercising at home with free weights  Diet: good water, fruits/vegetables daily     Relevant past medical, surgical, family and social history reviewed and updated as indicated. Interim medical history since our last visit reviewed. Allergies and medications reviewed and updated. Outpatient Medications Prior to Visit  Medication Sig Dispense Refill   aspirin 81 MG EC tablet Take 81 mg by mouth daily. Swallow whole.     Multiple Vitamins-Minerals (MULTIVITAMIN ADULT PO) Take 1 capsule by mouth daily.     cyanocobalamin (VITAMIN B12) 1000 MCG tablet Take 1 tablet (1,000 mcg total) by mouth 2 (two) times a week.     EPINEPHrine (EPIPEN 2-PAK) 0.3 mg/0.3 mL IJ SOAJ injection Inject 0.3 mg into the muscle as needed for anaphylaxis. 1 each  1   Omega-3 Fatty Acids (FISH OIL) 1000 MG CAPS Take 1 capsule by mouth daily.     glucosamine-chondroitin 500-400 MG tablet Take 2 tablets by mouth daily.     terbinafine (LAMISIL) 250 MG tablet Take 1 tablet (250 mg total) by mouth daily. 1 po qd for tinea unguium 30 tablet 1   No facility-administered medications prior to visit.     Per HPI unless specifically indicated in ROS section below Review of Systems  Objective:  BP (!) 158/82   Pulse (!) 55   Temp 98 F (36.7 C) (Oral)   Ht 5' 11.5" (1.816 m)   Wt 239 lb (108.4 kg)   SpO2 100%   BMI 32.87 kg/m   Wt Readings from Last 3 Encounters:  09/25/23 239 lb (108.4 kg)   09/13/23 230 lb (104.3 kg)  03/14/23 232 lb 2 oz (105.3 kg)      Physical Exam Vitals and nursing note reviewed.  Constitutional:      General: He is not in acute distress.    Appearance: Normal appearance. He is well-developed. He is not ill-appearing.  HENT:     Head: Normocephalic and atraumatic.     Right Ear: Hearing, tympanic membrane, ear canal and external ear normal.     Left Ear: Hearing, tympanic membrane, ear canal and external ear normal.     Mouth/Throat:     Mouth: Mucous membranes are moist.     Pharynx: Oropharynx is clear. No oropharyngeal exudate or posterior oropharyngeal erythema.  Eyes:     General: No scleral icterus.    Extraocular Movements: Extraocular movements intact.     Conjunctiva/sclera: Conjunctivae normal.     Pupils: Pupils are equal, round, and reactive to light.  Neck:     Thyroid: No thyroid mass or thyromegaly.     Vascular: No carotid bruit.  Cardiovascular:     Rate and Rhythm: Normal rate and regular rhythm.     Pulses: Normal pulses.          Radial pulses are 2+ on the right side and 2+ on the left side.     Heart sounds: Normal heart sounds. No murmur heard. Pulmonary:     Effort: Pulmonary effort is normal. No respiratory distress.     Breath sounds: Normal breath sounds. No wheezing, rhonchi or rales.  Abdominal:     General: Bowel sounds are normal. There is no distension.     Palpations: Abdomen is soft. There is no mass.     Tenderness: There is no abdominal tenderness. There is no guarding or rebound.     Hernia: No hernia is present.  Musculoskeletal:        General: Normal range of motion.     Cervical back: Normal range of motion and neck supple.     Right lower leg: No edema.     Left lower leg: No edema.     Comments:  Bilateral knee exam: No deformity on inspection. No pain with palpation of knee landmarks. No effusion/swelling noted. FROM in flex/extension with mild crepitus. Neg drawer test. Neg mcmurray  test. No pain with valgus/varus stress. No PFgrind. No abnormal patellar mobility.   Lymphadenopathy:     Cervical: No cervical adenopathy.  Skin:    General: Skin is warm and dry.     Findings: No rash.  Neurological:     General: No focal deficit present.     Mental Status: He is alert and oriented to person, place,  and time.  Psychiatric:        Mood and Affect: Mood normal.        Behavior: Behavior normal.        Thought Content: Thought content normal.        Judgment: Judgment normal.       Results for orders placed or performed in visit on 09/18/23  CBC with Differential/Platelet  Result Value Ref Range   WBC 6.2 4.0 - 10.5 K/uL   RBC 4.33 4.22 - 5.81 Mil/uL   Hemoglobin 14.7 13.0 - 17.0 g/dL   HCT 66.4 40.3 - 47.4 %   MCV 102.2 (H) 78.0 - 100.0 fl   MCHC 33.3 30.0 - 36.0 g/dL   RDW 25.9 56.3 - 87.5 %   Platelets 259.0 150.0 - 400.0 K/uL   Neutrophils Relative % 44.2 43.0 - 77.0 %   Lymphocytes Relative 38.0 12.0 - 46.0 %   Monocytes Relative 9.6 3.0 - 12.0 %   Eosinophils Relative 7.0 (H) 0.0 - 5.0 %   Basophils Relative 1.2 0.0 - 3.0 %   Neutro Abs 2.8 1.4 - 7.7 K/uL   Lymphs Abs 2.4 0.7 - 4.0 K/uL   Monocytes Absolute 0.6 0.1 - 1.0 K/uL   Eosinophils Absolute 0.4 0.0 - 0.7 K/uL   Basophils Absolute 0.1 0.0 - 0.1 K/uL  Vitamin B12  Result Value Ref Range   Vitamin B-12 1,175 (H) 211 - 911 pg/mL  Comprehensive metabolic panel  Result Value Ref Range   Sodium 142 135 - 145 mEq/L   Potassium 4.3 3.5 - 5.1 mEq/L   Chloride 107 96 - 112 mEq/L   CO2 28 19 - 32 mEq/L   Glucose, Bld 84 70 - 99 mg/dL   BUN 13 6 - 23 mg/dL   Creatinine, Ser 6.43 0.40 - 1.50 mg/dL   Total Bilirubin 0.5 0.2 - 1.2 mg/dL   Alkaline Phosphatase 63 39 - 117 U/L   AST 20 0 - 37 U/L   ALT 16 0 - 53 U/L   Total Protein 7.0 6.0 - 8.3 g/dL   Albumin 4.1 3.5 - 5.2 g/dL   GFR 32.95 >18.84 mL/min   Calcium 9.0 8.4 - 10.5 mg/dL  Lipid panel  Result Value Ref Range   Cholesterol 190 0 -  200 mg/dL   Triglycerides 166.0 0.0 - 149.0 mg/dL   HDL 63.01 >60.10 mg/dL   VLDL 93.2 0.0 - 35.5 mg/dL   LDL Cholesterol 732 (H) 0 - 99 mg/dL   Total CHOL/HDL Ratio 3    NonHDL 125.59     Assessment & Plan:   Problem List Items Addressed This Visit     Advanced care planning/counseling discussion - Primary (Chronic)    Previously discussed       Abdominal aortic ectasia (HCC)    Latest study 07/2020 without significant dilation (2.5cm).       Relevant Medications   EPINEPHrine (EPIPEN 2-PAK) 0.3 mg/0.3 mL IJ SOAJ injection   HLD (hyperlipidemia)    Chronic, stable on fish oil. The ASCVD Risk score (Arnett DK, et al., 2019) failed to calculate for the following reasons:   The 2019 ASCVD risk score is only valid for ages 73 to 43       Relevant Medications   EPINEPHrine (EPIPEN 2-PAK) 0.3 mg/0.3 mL IJ SOAJ injection   Low serum vitamin B12    B12 levels mildly elevated - drop dosing to once weekly.      Anaphylaxis due to hymenoptera  venom    Epi pen refilled.      Bilateral knee pain    L knee pain present for months, R knee pain present for the past week. Reassuring exam. Anticipate OA flare. He will restart glucosamine, continue fish oil, add vit D 2000 international units daily.  Ok to use diclofenac topical gel PRN He will return for xrays of not improving with above.       Macrocytosis without anemia    Stable over the last several years.  B12 levels high - will drop dosing to once weekly.  Fmhx MDS - consider periph smear, update folate next labs.       Elevated blood pressure reading in office without diagnosis of hypertension    Newly noted today. Borderline previously but adequate for age.  Reviewed low salt/sodium diet, DASH diet handout provided. No caffeine use.  BP log provided, I asked him to start monitoring BP at home and return in 1 month for HTN f/u visit         Meds ordered this encounter  Medications   EPINEPHrine (EPIPEN 2-PAK) 0.3  mg/0.3 mL IJ SOAJ injection    Sig: Inject 0.3 mg into the muscle as needed for anaphylaxis.    Dispense:  2 each    Refill:  1   Cholecalciferol (VITAMIN D3) 50 MCG (2000 UT) capsule    Sig: Take 1 capsule (2,000 Units total) by mouth daily.   cyanocobalamin (VITAMIN B12) 1000 MCG tablet    Sig: Take 1 tablet (1,000 mcg total) by mouth once a week.   Omega-3 Fatty Acids (FISH OIL) 1000 MG CAPS    Sig: Take 1 capsule (1,000 mg total) by mouth in the morning and at bedtime.    No orders of the defined types were placed in this encounter.   Patient Instructions  Drop b12 to once weekly  Ok to use topical diclofenac.  Restart glucosamine. Start vitamin D 2000 units daily.  Good to see you today.  Return as needed or in 1 year for next wellness visit.   Follow up plan: Return in about 1 year (around 09/24/2024) for medicare wellness visit.  Eustaquio Boyden, MD

## 2023-09-25 NOTE — Assessment & Plan Note (Signed)
B12 levels mildly elevated - drop dosing to once weekly.

## 2023-09-25 NOTE — Patient Instructions (Addendum)
Drop b12 to once weekly  Ok to use topical diclofenac.  Restart glucosamine. Start vitamin D 2000 units daily.  Good to see you today.  Return as needed or in 1 year for next wellness visit.

## 2023-09-25 NOTE — Assessment & Plan Note (Signed)
Epipen refilled

## 2023-09-25 NOTE — Assessment & Plan Note (Addendum)
Latest study 07/2020 without significant dilation (2.5cm).

## 2023-09-25 NOTE — Assessment & Plan Note (Signed)
Previously discussed.

## 2023-09-25 NOTE — Assessment & Plan Note (Signed)
Newly noted today. Borderline previously but adequate for age.  Reviewed low salt/sodium diet, DASH diet handout provided. No caffeine use.  BP log provided, I asked him to start monitoring BP at home and return in 1 month for HTN f/u visit

## 2023-09-25 NOTE — Assessment & Plan Note (Signed)
Chronic, stable on fish oil. The ASCVD Risk score (Arnett DK, et al., 2019) failed to calculate for the following reasons:   The 2019 ASCVD risk score is only valid for ages 16 to 80

## 2023-10-25 ENCOUNTER — Ambulatory Visit: Payer: Medicare Other | Admitting: Family Medicine

## 2023-10-25 ENCOUNTER — Encounter: Payer: Self-pay | Admitting: Family Medicine

## 2023-10-25 VITALS — BP 142/72 | HR 69 | Temp 97.9°F | Ht 71.5 in | Wt 235.1 lb

## 2023-10-25 DIAGNOSIS — I77811 Abdominal aortic ectasia: Secondary | ICD-10-CM | POA: Diagnosis not present

## 2023-10-25 DIAGNOSIS — I10 Essential (primary) hypertension: Secondary | ICD-10-CM

## 2023-10-25 DIAGNOSIS — R3129 Other microscopic hematuria: Secondary | ICD-10-CM | POA: Diagnosis not present

## 2023-10-25 LAB — POC URINALSYSI DIPSTICK (AUTOMATED)
Bilirubin, UA: NEGATIVE
Glucose, UA: NEGATIVE
Leukocytes, UA: NEGATIVE
Nitrite, UA: NEGATIVE
Protein, UA: POSITIVE — AB
Spec Grav, UA: >=1.03 — AB (ref 1.010–1.025)
Urobilinogen, UA: 0.2 U/dL
pH, UA: 5 (ref 5.0–8.0)

## 2023-10-25 LAB — URINALYSIS, MICROSCOPIC ONLY: RBC / HPF: NONE SEEN (ref 0–?)

## 2023-10-25 MED ORDER — ASPIRIN 81 MG PO TBEC: 81.0000 mg | DELAYED_RELEASE_TABLET | ORAL | Status: AC

## 2023-10-25 MED ORDER — AMLODIPINE BESYLATE 2.5 MG PO TABS: 2.5000 mg | ORAL_TABLET | Freq: Every day | ORAL | 3 refills | Status: DC

## 2023-10-25 MED ORDER — GLUCOSAMINE HCL 1000 MG PO TABS: 1.0000 | ORAL_TABLET | Freq: Two times a day (BID) | ORAL | Status: AC

## 2023-10-25 NOTE — Progress Notes (Signed)
Ph: [phone] Fax: [phone]   Patient ID: Patrick Sawyer, male    DOB: 11-25-1940, 82 y.o.   MRN: [id number]  This visit was conducted in person.  BP (!) 142/72 (BP Location: Right Arm, Cuff Size: Large)   Pulse 69   Temp 97.9 F (36.6 C) (Oral)   Ht 5' 11.5" (1.816 m)   Wt 235 lb 2 oz (106.7 kg)   SpO2 99%   BMI 32.34 kg/m   BP Readings from Last 3 Encounters:  10/25/23 (!) 142/72  09/25/23 (!) 158/82  06/14/23 139/76   CC: 1 mo HTN f/u visit  Subjective:   HPI: Patrick Sawyer is a 82 y.o. male presenting on 10/25/2023 for Medical Management of Chronic Issues (Here for 1 mo HTN f/u. Pt accompanied by wife, Revonda Standard. )   Elevated BP reading noted last month at [facility]. No h/o HTN. Does check blood pressures at home: has been tecking twice daily, but left BP log at home today (140-157/70-80s, few 120-130s systolic).  Peak 162 systolic. No low blood pressure readings or symptoms of dizziness/syncope. Denies HA, vision changes, CP/tightness, SOB, leg swelling.    Down 4 lbs.  Limiting salt/sodium in diet - this has been a decrease from prior.  Drinking plenty of water. Avoids NSAIDs.   Continues going to gym 3x/wk - both cardio and resistance training.   No know fmhx HTN.   Abd aortic ectasia - latest study 07/2020 without significant dilation (2.5cm).      Relevant past medical, surgical, family and social history reviewed and updated as indicated. Interim medical history since our last visit reviewed. Allergies and medications reviewed and updated. Outpatient Medications Prior to Visit  Medication Sig Dispense Refill   Cholecalciferol (VITAMIN D3) 50 MCG (2000 UT) capsule Take 1 capsule (2,000 Units total) by mouth daily.     cyanocobalamin (VITAMIN B12) 1000 MCG tablet Take 1 tablet (1,000 mcg total) by mouth once a week.     EPINEPHrine (EPIPEN 2-PAK) 0.3 mg/0.3 mL IJ SOAJ injection Inject 0.3 mg into the muscle as needed for anaphylaxis. 2  each 1   Multiple Vitamins-Minerals (MULTIVITAMIN ADULT PO) Take 1 capsule by mouth daily.     Omega-3 Fatty Acids (FISH OIL) 1000 MG CAPS Take 1 capsule (1,000 mg total) by mouth in the morning and at bedtime.     aspirin 81 MG EC tablet Take 81 mg by mouth daily. Swallow whole.     No facility-administered medications prior to visit.     Per HPI unless specifically indicated in ROS section below Review of Systems  Objective:  BP (!) 142/72 (BP Location: Right Arm, Cuff Size: Large)   Pulse 69   Temp 97.9 F (36.6 C) (Oral)   Ht 5' 11.5" (1.816 m)   Wt 235 lb 2 oz (106.7 kg)   SpO2 99%   BMI 32.34 kg/m   Wt Readings from Last 3 Encounters:  10/25/23 235 lb 2 oz (106.7 kg)  09/25/23 239 lb (108.4 kg)  09/13/23 230 lb (104.3 kg)      Physical Exam Vitals and nursing note reviewed.  Constitutional:      Appearance: Normal appearance. He is not ill-appearing.  HENT:     Mouth/Throat:     Mouth: Mucous membranes are moist.     Pharynx: Oropharynx is clear. No oropharyngeal exudate or posterior oropharyngeal erythema.  Eyes:     Extraocular Movements: Extraocular movements intact.     Comments: L artificial eye  Neck:     Thyroid: No thyroid mass or thyromegaly.  Cardiovascular:     Rate and Rhythm: Normal rate and regular rhythm.     Pulses: Normal pulses.     Heart sounds: Normal heart sounds. No murmur heard. Pulmonary:     Effort: Pulmonary effort is normal. No respiratory distress.     Breath sounds: Normal breath sounds. No wheezing, rhonchi or rales.  Musculoskeletal:     Cervical back: Normal range of motion and neck supple.     Right lower leg: No edema.     Left lower leg: No edema.  Skin:    General: Skin is warm and dry.     Findings: No rash.  Neurological:     Mental Status: He is alert.  Psychiatric:        Mood and Affect: Mood normal.        Behavior: Behavior normal.       Results for orders placed or performed in visit on 10/25/23  POCT  Urinalysis Dipstick (Automated)  Result Value Ref Range   Color, UA dark yellow    Clarity, UA clear    Glucose, UA Negative Negative   Bilirubin, UA negative    Ketones, UA +/-    Spec Grav, UA >=1.030 (A) 1.010 - 1.025   Blood, UA 1+    pH, UA 5.0 5.0 - 8.0   Protein, UA Positive (A) Negative   Urobilinogen, UA 0.2 0.2 or 1.0 E.U./dL   Nitrite, UA negative    Leukocytes, UA Negative Negative   No results found for: "TSH"   Lab Results  Component Value Date   NA 142 09/18/2023   CL 107 09/18/2023   K 4.3 09/18/2023   CO2 28 09/18/2023   BUN 13 09/18/2023   CREATININE 0.93 09/18/2023   GFR 77.63 09/18/2023   CALCIUM 9.0 09/18/2023   ALBUMIN 4.1 09/18/2023   GLUCOSE 84 09/18/2023    Lab Results  Component Value Date   WBC 6.2 09/18/2023   HGB 14.7 09/18/2023   HCT 44.3 09/18/2023   MCV 102.2 (H) 09/18/2023   PLT 259.0 09/18/2023   Assessment & Plan:   Problem List Items Addressed This Visit     Abdominal aortic ectasia (HCC)    On latest vascular aortic ultrasound largest abdominal aortic diameter was 2.5 cm consistent with ectasia not aneurysm. Discussed given history of this, reasonable to work towards tighter blood pressure control to prevent progression.      Relevant Medications   aspirin EC 81 MG tablet   amLODipine (NORVASC) 2.5 MG tablet   Hypertension - Primary    New diagnosis based on home readings averaging ~150s systolic. Reviewed diet and lifestyle choices to improve blood pressure control including low-salt/sodium diet, regular aerobic exercise, work on weight loss. Will start amlodipine 2.5 mg daily, reviewing common side effects including pedal edema and flushed feeling. Check urinalysis today. Return in 3 months for follow-up visit hypertension, baseline EKG, consider thyroid check.  He denies hypothyroid symptoms.      Relevant Medications   aspirin EC 81 MG tablet   amLODipine (NORVASC) 2.5 MG tablet   Other Relevant Orders   POCT  Urinalysis Dipstick (Automated) (Completed)   Microhematuria    Incidentally noted on UA today - will send urine microscopy.       Relevant Orders   Urinalysis, microscopic only     Meds ordered this encounter  Medications   Glucosamine HCl 1000 MG TABS  Sig: Take 1 tablet (1,000 mg total) by mouth in the morning and at bedtime.   aspirin EC 81 MG tablet    Sig: Take 1 tablet (81 mg total) by mouth every Monday, Wednesday, and Friday. Swallow whole.   amLODipine (NORVASC) 2.5 MG tablet    Sig: Take 1 tablet (2.5 mg total) by mouth daily.    Dispense:  90 tablet    Refill:  3    Orders Placed This Encounter  Procedures   Urinalysis, microscopic only   POCT Urinalysis Dipstick (Automated)    Patient Instructions  Urinalysis today  BP staying too high  Your goal blood pressure is <150/90, ideally <140/90. Work on low salt/sodium diet - goal <2 grams (2,000mg ) per day. Eat a diet high in fruits/vegetables and whole grains.  Look into mediterranean and DASH diet. Goal activity is 141min/wk of moderate intensity exercise.  This can be split into 30 minute chunks.  If you are not at this level, you can start with smaller 10-15 min increments and slowly build up activity. Look at www.heart.org for more resources   Work on weight loss Start amlodipine 2.5mg  daily in the morning.  Let us know if any trouble with medicine.  Return in 3 months for BP follow up visit.   Follow up plan: Return in about 3 months (around 01/23/2024), or if symptoms worsen or fail to improve, for follow up visit.  Eustaquio Boyden, MD

## 2023-10-25 NOTE — Assessment & Plan Note (Signed)
New diagnosis based on home readings averaging ~150s systolic. Reviewed diet and lifestyle choices to improve blood pressure control including low-salt/sodium diet, regular aerobic exercise, work on weight loss. Will start amlodipine 2.5 mg daily, reviewing common side effects including pedal edema and flushed feeling. Check urinalysis today. Return in 3 months for follow-up visit hypertension, baseline EKG, consider thyroid check.  He denies hypothyroid symptoms.

## 2023-10-25 NOTE — Assessment & Plan Note (Signed)
On latest vascular aortic ultrasound largest abdominal aortic diameter was 2.5 cm consistent with ectasia not aneurysm. Discussed given history of this, reasonable to work towards tighter blood pressure control to prevent progression.

## 2023-10-25 NOTE — Patient Instructions (Addendum)
Urinalysis today  BP staying too high  Your goal blood pressure is <150/90, ideally <140/90. Work on low salt/sodium diet - goal <2 grams (2,000mg ) per day. Eat a diet high in fruits/vegetables and whole grains.  Look into mediterranean and DASH diet. Goal activity is 19min/wk of moderate intensity exercise.  This can be split into 30 minute chunks.  If you are not at this level, you can start with smaller 10-15 min increments and slowly build up activity. Look at www.heart.org for more resources   Work on weight loss Start amlodipine 2.5mg  daily in the morning.  Let us know if any trouble with medicine.  Return in 3 months for BP follow up visit.

## 2023-10-25 NOTE — Assessment & Plan Note (Signed)
Incidentally noted on UA today - will send urine microscopy.

## 2023-11-19 ENCOUNTER — Encounter: Payer: Self-pay | Admitting: Family Medicine

## 2023-12-21 ENCOUNTER — Ambulatory Visit: Payer: Medicare Other | Admitting: Dermatology

## 2024-01-23 ENCOUNTER — Ambulatory Visit (INDEPENDENT_AMBULATORY_CARE_PROVIDER_SITE_OTHER): Payer: Medicare Other | Admitting: Family Medicine

## 2024-01-23 ENCOUNTER — Encounter: Payer: Self-pay | Admitting: Family Medicine

## 2024-01-23 VITALS — BP 138/76 | HR 57 | Temp 97.8°F | Ht 71.5 in | Wt 236.0 lb

## 2024-01-23 DIAGNOSIS — I440 Atrioventricular block, first degree: Secondary | ICD-10-CM | POA: Diagnosis not present

## 2024-01-23 DIAGNOSIS — I10 Essential (primary) hypertension: Secondary | ICD-10-CM

## 2024-01-23 DIAGNOSIS — R001 Bradycardia, unspecified: Secondary | ICD-10-CM | POA: Diagnosis not present

## 2024-01-23 NOTE — Patient Instructions (Addendum)
 BP overall doing better based on our readings. I wonder accuracy of home cuff- try new BP cuff to monitor at home (log sheet provided again today).  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  Return in 3 months for follow up hypertension

## 2024-01-23 NOTE — Assessment & Plan Note (Signed)
 Discussed avoiding AV nodal blocking agents mainly BBs.

## 2024-01-23 NOTE — Assessment & Plan Note (Signed)
Chronic, overall stable period.

## 2024-01-23 NOTE — Assessment & Plan Note (Signed)
 Chronic, persistently elevated readings at home however well controlled in office otday. ?accuracy of home BP cuff - which is 81 years old. He will buy new BP cuff and monitor readings with this. He will bring in home cuff to next appt to compare to our readings. For now, continue amlodipine 2.5mg  daily. Reviewed best strategy to check blood pressure in a resting state. Reviewed low salt/sodium diet.

## 2024-01-23 NOTE — Progress Notes (Signed)
 Ph: [phone] Fax: [phone]   Patient ID: Patrick Sawyer, male    DOB: September 19, 1941, 83 y.o.   MRN: [id number]  This visit was conducted in person.  BP 138/76 (BP Location: Right Arm, Cuff Size: Large)   Pulse (!) 57   Temp 97.8 F (36.6 C) (Oral)   Ht 5' 11.5" (1.816 m)   Wt 236 lb (107 kg)   SpO2 97%   BMI 32.46 kg/m   Pulse Readings from Last 3 Encounters:  01/23/24 (!) 57  10/25/23 69  09/25/23 (!) 55   BP Readings from Last 3 Encounters:  01/23/24 138/76  10/25/23 (!) 142/72  09/25/23 (!) 158/82    CC: HTN f/u visit  Subjective:   HPI: Patrick Sawyer is a 83 y.o. male presenting on 01/23/2024 for Medical Management of Chronic Issues (Here for 3 mo HTN f/u. Pt accompanied by wife, Revonda Standard. )   See prior note for details.  HTN - Compliant with current antihypertensive regimen of amlodipine 2.5mg  daily.  Does check blood pressures at home: averaging 140-150s/80s, few to 160s, one to 128. No low blood pressure readings or symptoms of dizziness/syncope. Denies HA, vision changes, CP/tightness, SOB, leg swelling. No presyncope.   He gives up alcohol during lent.      Relevant past medical, surgical, family and social history reviewed and updated as indicated. Interim medical history since our last visit reviewed. Allergies and medications reviewed and updated. Outpatient Medications Prior to Visit  Medication Sig Dispense Refill   amLODipine (NORVASC) 2.5 MG tablet Take 1 tablet (2.5 mg total) by mouth daily. 90 tablet 3   aspirin EC 81 MG tablet Take 1 tablet (81 mg total) by mouth every Monday, Wednesday, and Friday. Swallow whole.     Cholecalciferol (VITAMIN D3) 50 MCG (2000 UT) capsule Take 1 capsule (2,000 Units total) by mouth daily.     cyanocobalamin (VITAMIN B12) 1000 MCG tablet Take 1 tablet (1,000 mcg total) by mouth once a week.     EPINEPHrine (EPIPEN 2-PAK) 0.3 mg/0.3 mL IJ SOAJ injection Inject 0.3 mg into the muscle as  needed for anaphylaxis. 2 each 1   Glucosamine HCl 1000 MG TABS Take 1 tablet (1,000 mg total) by mouth in the morning and at bedtime.     Multiple Vitamins-Minerals (MULTIVITAMIN ADULT PO) Take 1 capsule by mouth daily.     Omega-3 Fatty Acids (FISH OIL) 1000 MG CAPS Take 1 capsule (1,000 mg total) by mouth in the morning and at bedtime.     No facility-administered medications prior to visit.     Per HPI unless specifically indicated in ROS section below Review of Systems  Objective:  BP 138/76 (BP Location: Right Arm, Cuff Size: Large)   Pulse (!) 57   Temp 97.8 F (36.6 C) (Oral)   Ht 5' 11.5" (1.816 m)   Wt 236 lb (107 kg)   SpO2 97%   BMI 32.46 kg/m   Wt Readings from Last 3 Encounters:  01/23/24 236 lb (107 kg)  10/25/23 235 lb 2 oz (106.7 kg)  09/25/23 239 lb (108.4 kg)      Physical Exam Vitals and nursing note reviewed.  Constitutional:      Appearance: Normal appearance. He is not ill-appearing.  HENT:     Head: Normocephalic and atraumatic.  Eyes:     Extraocular Movements: Extraocular movements intact.     Pupils: Pupils are equal, round, and reactive to light.  Neck:  Thyroid: No thyroid mass or thyromegaly.  Cardiovascular:     Rate and Rhythm: Normal rate and regular rhythm.     Pulses: Normal pulses.     Heart sounds: Normal heart sounds. No murmur heard. Pulmonary:     Effort: Pulmonary effort is normal. No respiratory distress.     Breath sounds: Normal breath sounds. No wheezing, rhonchi or rales.  Musculoskeletal:     Cervical back: Normal range of motion and neck supple. No rigidity.     Right lower leg: No edema.     Left lower leg: No edema.  Lymphadenopathy:     Cervical: No cervical adenopathy.  Skin:    General: Skin is warm and dry.     Findings: No rash.  Neurological:     Mental Status: He is alert.  Psychiatric:        Mood and Affect: Mood normal.        Behavior: Behavior normal.       Results for orders placed or  performed in visit on 10/25/23  POCT Urinalysis Dipstick (Automated)   Collection Time: 10/25/23 10:45 AM  Result Value Ref Range   Color, UA dark yellow    Clarity, UA clear    Glucose, UA Negative Negative   Bilirubin, UA negative    Ketones, UA +/-    Spec Grav, UA >=1.030 (A) 1.010 - 1.025   Blood, UA 1+    pH, UA 5.0 5.0 - 8.0   Protein, UA Positive (A) Negative   Urobilinogen, UA 0.2 0.2 or 1.0 E.U./dL   Nitrite, UA negative    Leukocytes, UA Negative Negative  Urinalysis, microscopic only   Collection Time: 10/25/23 11:48 AM  Result Value Ref Range   WBC, UA 0-2/hpf 0-2/hpf   RBC / HPF none seen 0-2/hpf   Mucus, UA Presence of (A) None   Squamous Epithelial / HPF Rare(0-4/hpf) Rare(0-4/hpf)   Amorphous Present (A) None;Present   EKG - sinus bradycardia 50s, 1st degree AVB with prolonged PR, normal axis, no hypertrophy or acute ST/T changes, poor r wave progression   Assessment & Plan:   Problem List Items Addressed This Visit     Bradycardia   Chronic, overall stable period       Hypertension - Primary   Chronic, persistently elevated readings at home however well controlled in office otday. ?accuracy of home BP cuff - which is 81 years old. He will buy new BP cuff and monitor readings with this. He will bring in home cuff to next appt to compare to our readings. For now, continue amlodipine 2.5mg  daily. Reviewed best strategy to check blood pressure in a resting state. Reviewed low salt/sodium diet.       Relevant Orders   EKG 12-Lead (Completed)   First degree AV block   Discussed avoiding AV nodal blocking agents mainly BBs.        No orders of the defined types were placed in this encounter.   Orders Placed This Encounter  Procedures   EKG 12-Lead    Patient Instructions  BP overall doing better based on our readings. I wonder accuracy of home cuff- try new BP cuff to monitor at home (log sheet provided again today).  EKG overall ok ,did show  first degree AV block - we should avoid beta blockers.  Return in 3 months for follow up hypertension  Follow up plan: Return in about 3 months (around 04/24/2024) for follow up visit.  Eustaquio Boyden, MD

## 2024-01-25 ENCOUNTER — Encounter: Payer: Self-pay | Admitting: Dermatology

## 2024-01-25 ENCOUNTER — Ambulatory Visit: Payer: Medicare Other | Admitting: Dermatology

## 2024-01-25 DIAGNOSIS — D1801 Hemangioma of skin and subcutaneous tissue: Secondary | ICD-10-CM | POA: Diagnosis not present

## 2024-01-25 DIAGNOSIS — Z85828 Personal history of other malignant neoplasm of skin: Secondary | ICD-10-CM

## 2024-01-25 DIAGNOSIS — Z86018 Personal history of other benign neoplasm: Secondary | ICD-10-CM

## 2024-01-25 DIAGNOSIS — L918 Other hypertrophic disorders of the skin: Secondary | ICD-10-CM | POA: Diagnosis not present

## 2024-01-25 DIAGNOSIS — D239 Other benign neoplasm of skin, unspecified: Secondary | ICD-10-CM | POA: Diagnosis not present

## 2024-01-25 DIAGNOSIS — W908XXA Exposure to other nonionizing radiation, initial encounter: Secondary | ICD-10-CM

## 2024-01-25 DIAGNOSIS — L820 Inflamed seborrheic keratosis: Secondary | ICD-10-CM

## 2024-01-25 DIAGNOSIS — Z1283 Encounter for screening for malignant neoplasm of skin: Secondary | ICD-10-CM | POA: Diagnosis not present

## 2024-01-25 DIAGNOSIS — L570 Actinic keratosis: Secondary | ICD-10-CM

## 2024-01-25 DIAGNOSIS — L578 Other skin changes due to chronic exposure to nonionizing radiation: Secondary | ICD-10-CM | POA: Diagnosis not present

## 2024-01-25 DIAGNOSIS — L814 Other melanin hyperpigmentation: Secondary | ICD-10-CM | POA: Diagnosis not present

## 2024-01-25 DIAGNOSIS — L821 Other seborrheic keratosis: Secondary | ICD-10-CM | POA: Diagnosis not present

## 2024-01-25 DIAGNOSIS — L905 Scar conditions and fibrosis of skin: Secondary | ICD-10-CM

## 2024-01-25 DIAGNOSIS — D229 Melanocytic nevi, unspecified: Secondary | ICD-10-CM

## 2024-01-25 NOTE — Patient Instructions (Addendum)

## 2024-01-25 NOTE — Progress Notes (Signed)
 Follow-Up Visit   Subjective  Patrick Sawyer is a 83 y.o. male who presents for the following: Skin Cancer Screening and Full Body Skin Exam, place at L axilla and R flank x4-5 months, itchy & irritated. Red spots a R forearm x2 weeks, not itchy. Pt with history of dysplastic nevus, hx BCC.  The patient presents for Total-Body Skin Exam (TBSE) for skin cancer screening and mole check. The patient has spots, moles and lesions to be evaluated, some may be new or changing and the patient may have concern these could be cancer.   The following portions of the chart were reviewed this encounter and updated as appropriate: medications, allergies, medical history  Review of Systems:  No other skin or systemic complaints except as noted in HPI or Assessment and Plan.  Objective  Well appearing patient in no apparent distress; mood and affect are within normal limits.  A full examination was performed including scalp, head, eyes, ears, nose, lips, neck, chest, axillae, abdomen, back, buttocks, bilateral upper extremities, bilateral lower extremities, hands, feet, fingers, toes, fingernails, and toenails. All findings within normal limits unless otherwise noted below.   Relevant physical exam findings are noted in the Assessment and Plan.  R forearm Stuck on waxy paps with erythema Left Hand, forearms, forehead Pink scaly macules  Assessment & Plan   SKIN CANCER SCREENING PERFORMED TODAY.  ACTINIC DAMAGE - Chronic condition, secondary to cumulative UV/sun exposure - diffuse scaly erythematous macules with underlying dyspigmentation - Recommend daily broad spectrum sunscreen SPF 30+ to sun-exposed areas, reapply every 2 hours as needed.  - Staying in the shade or wearing long sleeves, sun glasses (UVA+UVB protection) and wide brim hats (4-inch brim around the entire circumference of the hat) are also recommended for sun protection.  - Call for new or changing lesions.  LENTIGINES,  SEBORRHEIC KERATOSES, HEMANGIOMAS - Benign normal skin lesions - Benign-appearing - Call for any changes  DERMATOFIBROMA Exam: Firm pink/brown papulenodule with dimple sign. Treatment Plan: A dermatofibroma is a benign growth possibly related to trauma, such as an insect bite, cut from shaving, or inflamed acne-type bump.  Treatment options to remove include shave or excision with resulting scar and risk of recurrence.  Since benign-appearing and not bothersome, will observe for now.   MELANOCYTIC NEVI - Tan-brown and/or pink-flesh-colored symmetric macules and papules - Benign appearing on exam today - Observation - Call clinic for new or changing moles - Recommend daily use of broad spectrum spf 30+ sunscreen to sun-exposed areas.   History of Dysplastic Nevi Left thigh 2010 - No evidence of recurrence today - Recommend regular full body skin exams - Recommend daily broad spectrum sunscreen SPF 30+ to sun-exposed areas, reapply every 2 hours as needed.  - Call if any new or changing lesions are noted between office visits    History of Basal Cell Carcinoma of the Skin Left posterior ear 2015 - No evidence of recurrence today - Recommend regular full body skin exams - Recommend daily broad spectrum sunscreen SPF 30+ to sun-exposed areas, reapply every 2 hours as needed.  - Call if any new or changing lesions are noted between office visits    Acrochordons (Skin Tags) - Fleshy, skin-colored pedunculated papules at L axilla - Benign appearing.  - Observe. - If desired, they can be removed with an in office procedure that is not covered by insurance. - Please call the clinic if you notice any new or changing lesions.  SCAR Exam: Dyspigmented smooth  macule or patch at L hand/medial wrist Benign-appearing.  Observation.  Call clinic for new or changing lesions. Recommend daily broad spectrum sunscreen SPF 30+, reapply every 2 hours as needed. Treatment: Recommend Serica  moisturizing scar formula cream every night or [employer] brand or Mederma silicone scar sheet every night for the first year after a scar appears to help with scar remodeling if desired. Scars remodel on their own for a full year and will gradually improve in appearance over time.   INFLAMED SEBORRHEIC KERATOSIS R forearm  AK (ACTINIC KERATOSIS) Left Hand, forearms, forehead Actinic keratoses are precancerous spots that appear secondary to cumulative UV radiation exposure/sun exposure over time. They are chronic with expected duration over 1 year. A portion of actinic keratoses will progress to squamous cell carcinoma of the skin. It is not possible to reliably predict which spots will progress to skin cancer and so treatment is recommended to prevent development of skin cancer.  Recommend daily broad spectrum sunscreen SPF 30+ to sun-exposed areas, reapply every 2 hours as needed.  Recommend staying in the shade or wearing long sleeves, sun glasses (UVA+UVB protection) and wide brim hats (4-inch brim around the entire circumference of the hat). Call for new or changing lesions.  Discussed treatment with LN2, pt would like to monitor and recheck at follow up. MULTIPLE BENIGN NEVI   SEBORRHEIC KERATOSES   LENTIGINES   ACTINIC ELASTOSIS   CHERRY ANGIOMA   DERMATOFIBROMA   ACROCHORDON   Return in about 1 year (around 01/24/2025) for TBSE, Hx Dysplastic Nevi, Hx BCC.  Wynonia Lawman, CMA, am acting as scribe for Elie Goody, MD .   Documentation: I have reviewed the above documentation for accuracy and completeness, and I agree with the above.  Elie Goody, MD

## 2024-02-01 ENCOUNTER — Encounter: Payer: Self-pay | Admitting: Family Medicine

## 2024-02-02 NOTE — Telephone Encounter (Signed)
 Patient dropped off [employer] PB monitor Request form Placed in providers box at front desk  States it was per request from [place].

## 2024-02-02 NOTE — Telephone Encounter (Signed)
 Placed form in Dr. Timoteo Expose box.

## 2024-02-05 NOTE — Telephone Encounter (Addendum)
 Faxed completed form to [employer] at [phone].   [Form is in basket on [employer]. Made copy to scan.]

## 2024-03-14 DIAGNOSIS — H40001 Preglaucoma, unspecified, right eye: Secondary | ICD-10-CM | POA: Diagnosis not present

## 2024-03-14 DIAGNOSIS — H2511 Age-related nuclear cataract, right eye: Secondary | ICD-10-CM | POA: Diagnosis not present

## 2024-03-14 DIAGNOSIS — H353112 Nonexudative age-related macular degeneration, right eye, intermediate dry stage: Secondary | ICD-10-CM | POA: Diagnosis not present

## 2024-03-14 DIAGNOSIS — H43811 Vitreous degeneration, right eye: Secondary | ICD-10-CM | POA: Diagnosis not present

## 2024-05-01 ENCOUNTER — Ambulatory Visit (INDEPENDENT_AMBULATORY_CARE_PROVIDER_SITE_OTHER): Admitting: Family Medicine

## 2024-05-01 ENCOUNTER — Encounter: Payer: Self-pay | Admitting: Family Medicine

## 2024-05-01 VITALS — BP 128/66 | HR 62 | Temp 97.8°F | Ht 71.5 in | Wt 234.0 lb

## 2024-05-01 DIAGNOSIS — I10 Essential (primary) hypertension: Secondary | ICD-10-CM | POA: Diagnosis not present

## 2024-05-01 NOTE — Assessment & Plan Note (Addendum)
 Chronic, stable based on office and home BP readings. Continue low dose amlodipine  which he is tolerating well.  Reviewed low salt/sodium diet (DASH diet handout previously provided), reviewed avoiding OTC decongestants.  Reassess at CPE 09/2024.

## 2024-05-01 NOTE — Progress Notes (Signed)
 Ph: [phone] Fax: [phone]   Patient ID: Patrick Sawyer, male    DOB: [birth year], 83 y.o.   MRN: [id number]  This visit was conducted in person.  BP 128/66 (BP Location: Right Arm, Cuff Size: Large)   Pulse 62   Temp 97.8 F (36.6 C) (Oral)   Ht 5' 11.5 (1.816 m)   Wt 234 lb (106.1 kg)   SpO2 98%   BMI 32.18 kg/m   BP Readings from Last 3 Encounters:  05/01/24 128/66  01/23/24 138/76  10/25/23 (!) 142/72    CC: 3 mo HTN f/u visit  Subjective:   HPI: Patrick Sawyer is a 83 y.o. male presenting on 05/01/2024 for Medical Management of Chronic Issues (Here for 3 mo HTN f/u. Pt provided recent BP log. Pt accompanied by wife, Patrick Sawyer.  )   See prior note for details.  HTN - on amlodipine  2.5mg  daily.  Brings home BP log which was reviewed: 115-155/70s, averaging 130s systolic.  No HA, vision changes, CP/tightness, SOB, leg swelling.  No pedal edema.  No dizziness or lightheadedness.  Avoid BB in 1st degree AVB by prior EKG.        Relevant past medical, surgical, family and social history reviewed and updated as indicated. Interim medical history since our last visit reviewed. Allergies and medications reviewed and updated. Outpatient Medications Prior to Visit  Medication Sig Dispense Refill   amLODipine  (NORVASC ) 2.5 MG tablet Take 1 tablet (2.5 mg total) by mouth daily. 90 tablet 3   aspirin  EC 81 MG tablet Take 1 tablet (81 mg total) by mouth every Monday, Wednesday, and Friday. Swallow whole.     Cholecalciferol (VITAMIN D3) 50 MCG (2000 UT) capsule Take 1 capsule (2,000 Units total) by mouth daily.     cyanocobalamin  (VITAMIN B12) 1000 MCG tablet Take 1 tablet (1,000 mcg total) by mouth once a week.     EPINEPHrine  (EPIPEN  2-PAK) 0.3 mg/0.3 mL IJ SOAJ injection Inject 0.3 mg into the muscle as needed for anaphylaxis. 2 each 1   Glucosamine HCl 1000 MG TABS Take 1 tablet (1,000 mg total) by mouth in the morning and at bedtime.      Multiple Vitamins-Minerals (MULTIVITAMIN ADULT PO) Take 1 capsule by mouth daily.     Omega-3 Fatty Acids (FISH OIL ) 1000 MG CAPS Take 1 capsule (1,000 mg total) by mouth in the morning and at bedtime.     No facility-administered medications prior to visit.     Per HPI unless specifically indicated in ROS section below Review of Systems  Objective:  BP 128/66 (BP Location: Right Arm, Cuff Size: Large)   Pulse 62   Temp 97.8 F (36.6 C) (Oral)   Ht 5' 11.5 (1.816 m)   Wt 234 lb (106.1 kg)   SpO2 98%   BMI 32.18 kg/m   Wt Readings from Last 3 Encounters:  05/01/24 234 lb (106.1 kg)  01/23/24 236 lb (107 kg)  10/25/23 235 lb 2 oz (106.7 kg)      Physical Exam Vitals and nursing note reviewed.  Constitutional:      Appearance: Normal appearance. He is not ill-appearing.   Eyes:     Extraocular Movements: Extraocular movements intact.     Pupils: Pupils are equal, round, and reactive to light.   Neck:     Thyroid : No thyroid  mass or thyromegaly.   Cardiovascular:     Rate and Rhythm: Normal rate and regular rhythm.     Pulses: Normal  pulses.     Heart sounds: Normal heart sounds. No murmur heard. Pulmonary:     Effort: Pulmonary effort is normal. No respiratory distress.     Breath sounds: Normal breath sounds. No wheezing, rhonchi or rales.   Musculoskeletal:     Cervical back: Normal range of motion and neck supple. No rigidity.     Right lower leg: No edema.     Left lower leg: No edema.  Lymphadenopathy:     Cervical: No cervical adenopathy.   Skin:    General: Skin is warm and dry.     Findings: No rash.   Neurological:     Mental Status: He is alert.   Psychiatric:        Mood and Affect: Mood normal.        Behavior: Behavior normal.       Lab Results  Component Value Date   NA 142 09/18/2023   CL 107 09/18/2023   K 4.3 09/18/2023   CO2 28 09/18/2023   BUN 13 09/18/2023   CREATININE 0.93 09/18/2023   GFR 77.63 09/18/2023   CALCIUM 9.0  09/18/2023   ALBUMIN 4.1 09/18/2023   GLUCOSE 84 09/18/2023     Assessment & Plan:   Problem List Items Addressed This Visit     Hypertension - Primary   Chronic, stable based on office and home BP readings. Continue low dose amlodipine  which he is tolerating well.  Reviewed low salt/sodium diet (DASH diet handout previously provided), reviewed avoiding OTC decongestants.  Reassess at CPE 09/2024.         No orders of the defined types were placed in this encounter.   No orders of the defined types were placed in this encounter.   Patient Instructions  You are doing well today Continue low dose amlodipine  Return for scheduled physical in November.   Follow up plan: No follow-ups on file.  Claire Crick, MD

## 2024-05-01 NOTE — Patient Instructions (Signed)
 You are doing well today Continue low dose amlodipine  Return for scheduled physical in November.

## 2024-05-03 ENCOUNTER — Encounter: Payer: Self-pay | Admitting: Physician Assistant

## 2024-06-19 ENCOUNTER — Ambulatory Visit (INDEPENDENT_AMBULATORY_CARE_PROVIDER_SITE_OTHER): Admitting: Family Medicine

## 2024-06-19 ENCOUNTER — Encounter: Payer: Self-pay | Admitting: Family Medicine

## 2024-06-19 VITALS — BP 132/80 | HR 65 | Temp 98.5°F | Ht 71.5 in | Wt 239.2 lb

## 2024-06-19 DIAGNOSIS — M545 Low back pain, unspecified: Secondary | ICD-10-CM | POA: Diagnosis not present

## 2024-06-19 MED ORDER — PREDNISONE 20 MG PO TABS
ORAL_TABLET | ORAL | 0 refills | Status: DC
Start: 2024-06-19 — End: 2024-09-25

## 2024-06-19 NOTE — Progress Notes (Signed)
 Patrick Sutphen T. Esiah Bazinet, MD, CAQ Sports Medicine [facility] at [facility] [address] [place], [address]  Phone: [phone]  FAX: [phone]  Patrick Sawyer - 83 y.o. male  MRN [id number]  Date of Birth: 05-Feb-1941  Date: 06/19/2024  PCP: Patrick Baller, MD  Referral: Patrick Baller, MD  Chief Complaint  Patient presents with   Back Pain    Woke up Sunday Morning and couldn't bend over   Subjective:   Patrick Sawyer is a 83 y.o. very pleasant male patient with Body mass index is 32.9 kg/m. who presents with the following:  Has had some mild back pain off and on for a long time.  Typically, he rates this at about 2 out of 10 in general  Thursday and Friday last week -did a lot of work at home Sat went to the gym Sun woke up and really painful Feels more in the R center in the back, and a little bit more in the right compared to the left Mild right-sided upper posterior pelvis pain  Since Sunday, it has been improving some   Has tried some Advil, did not help Cold and heat - helped some  When sitting has a hard time straigthening up Moving will help it feel better Lying flat will feel better.   No numbness, bowel or bladder incontinence or radicular pain.  Review of Systems is noted in the HPI, as appropriate  Objective:   BP 132/80   Pulse 65   Temp 98.5 F (36.9 C) (Temporal)   Ht 5' 11.5 (1.816 m)   Wt 239 lb 4 oz (108.5 kg)   SpO2 98%   BMI 32.90 kg/m   GEN: No acute distress; alert,appropriate. PULM: Breathing comfortably in no respiratory distress PSYCH: Normally interactive.    Range of motion at  the waist: Flexion, extension, lateral bending and rotation: Mild restriction in forward flexion, but extension, lateral bending, and rotation are all preserved  No echymosis or edema Rises to examination table with mild difficulty Gait: minimally antalgic  Inspection/Deformity: N Paraspinus  Tenderness: Minimal  B Ankle Dorsiflexion (L5,4): 5/5 B Great Toe Dorsiflexion (L5,4): 5/5 Heel Walk (L5): WNL Toe Walk (S1): WNL Rise/Squat (L4): WNL, mild pain  SENSORY B Medial Foot (L4): WNL B Dorsum (L5): WNL B Lateral (S1): WNL Light Touch: WNL Pinprick: WNL  REFLEXES Knee (L4): 2+ Ankle (S1): 2+  B SLR, seated: neg B SLR, supine: Mild back pain on the right without sciatica B FABER: Mild back pain on the right B Reverse FABER: neg B Greater Troch: NT B Log Roll: neg B Sciatic Notch: NT   Laboratory and Imaging Data:  Assessment and Plan:     ICD-10-CM   1. Acute right-sided low back pain without sciatica  M54.50      Acute on chronic low back pain on the right side with exacerbation.  Right now is not having any radicular pains, numbness, tingling or other alarming features.  He has already started to feel better. I am going to give him a pulse of some steroids and gave him some basic McKenzie style lumbar rehab  Medication Management during today's office visit: Meds ordered this encounter  Medications   predniSONE  (DELTASONE ) 20 MG tablet    Sig: 2 tabs po daily for 5 days, then 1 tab po daily for 5 days    Dispense:  15 tablet    Refill:  0   There are no  discontinued medications.  Orders placed today for conditions managed today: No orders of the defined types were placed in this encounter.   Disposition: No follow-ups on file.  [employer] speech-to-text software was used for transcription in this dictation.  Possible transcriptional errors can occur using Animal nutritionist.   Signed,  Patrick Sawyer. Patrick Trevizo, MD   Outpatient Encounter Medications as of 06/19/2024  Medication Sig   amLODipine  (NORVASC ) 2.5 MG tablet Take 1 tablet (2.5 mg total) by mouth daily.   aspirin  EC 81 MG tablet Take 1 tablet (81 mg total) by mouth every Monday, Wednesday, and Friday. Swallow whole.   Cholecalciferol (VITAMIN D3) 50 MCG (2000 UT) capsule Take 1  capsule (2,000 Units total) by mouth daily.   cyanocobalamin  (VITAMIN B12) 1000 MCG tablet Take 1 tablet (1,000 mcg total) by mouth once a week.   EPINEPHrine  (EPIPEN  2-PAK) 0.3 mg/0.3 mL IJ SOAJ injection Inject 0.3 mg into the muscle as needed for anaphylaxis.   Glucosamine HCl 1000 MG TABS Take 1 tablet (1,000 mg total) by mouth in the morning and at bedtime.   Multiple Vitamins-Minerals (MULTIVITAMIN ADULT PO) Take 1 capsule by mouth daily.   Omega-3 Fatty Acids (FISH OIL ) 1000 MG CAPS Take 1 capsule (1,000 mg total) by mouth in the morning and at bedtime.   predniSONE  (DELTASONE ) 20 MG tablet 2 tabs po daily for 5 days, then 1 tab po daily for 5 days   No facility-administered encounter medications on file as of 06/19/2024.

## 2024-06-25 ENCOUNTER — Ambulatory Visit: Admitting: Family Medicine

## 2024-06-26 NOTE — Progress Notes (Signed)
 Ellouise Console, PA-C [address] [place], [place]  [address] Phone: [phone]   Gastroenterology Consultation  Referring Provider:     Rilla Baller, MD Primary Care Physician:  Rilla Baller, MD Primary Gastroenterologist:  Ellouise Console, PA-C / Dr. Gordy Starch  Reason for Consultation:     Hemorrhoids, rectal bleeding        HPI:   Patrick Sawyer is a 83 y.o. y/o male referred for consultation & management  by Rilla Baller, MD. previous history of adenomatous and sessile serrated polyps.  Current symptoms: Patient has seen mild episodes of bright red blood after bowel movements for 1 month, since mid July.  He has spotting for 1 or 2 days and then it stops.  It comes and goes.  He has seen small drops of blood in his underpants after he is out working.  Also notices mild drops of blood on the tissue after bowel movement.  He is not having any rectal pain.  He denies diarrhea, constipation, hard stools, or straining.  Denies abdominal pain, weight loss, weakness or fatigue.  He reports having internal hemorrhoid banding 7 or 8 years ago.  He thinks he may need another hemorrhoid banding procedure.  He has not tried any suppositories or cream prescriptions.  02/2020 last colonoscopy by Dr. Starch: 1 small 6 mm hyperplastic polyp removed.  Diverticulosis.  Internal hemorrhoids.  No repeat colonoscopy was recommended due to advanced age.  01/2015 colonoscopy by Dr. Viktoria: 5 diminutive polyps removed.  Pathology showed hyperplastic and colon mucosal polyps.  06/2012 colonoscopy by Dr. Viktoria 2 small (3 mm and 9 mm) polyps removed.  Pathology showed 1 tubular adenoma and 1 sessile serrated adenoma.  09/2023 last labs: CBC showed Hgb 14.7, MCV 102.  PMH: Alcohol dependence, abdominal aortic aneurysm, HTN, first-degree AV block, HLD, Hx B12 deficiency.  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   HLD (hyperlipidemia)    Obesity, Class I, BMI 30-34.9    Seasonal allergies    Traumatic enucleation of left eye     Past Surgical History:  Procedure Laterality Date   APPENDECTOMY  1959   COLONOSCOPY  01/2015   small polyps, rpt 5 yrs Una)   COLONOSCOPY  06/2012   small polyps Cathe)   COLONOSCOPY  02/2020   1 HP, diverticulosis, int hem (Pyrtle)   ENUCLEATION Left 1969   HEMORRHOID BANDING  04/2015   Dr Claudene FONTANA HERNIA REPAIR Right    MENISCUS REPAIR Left 2005   MOHS SURGERY  04/2014   VARICOSE VEIN SURGERY  2006    Prior to Admission medications   Medication Sig Start Date End Date Taking? Authorizing Provider  amLODipine  (NORVASC ) 2.5 MG tablet Take 1 tablet (2.5 mg total) by mouth daily. 10/25/23   Rilla Baller, MD  aspirin  EC 81 MG tablet Take 1 tablet (81 mg total) by mouth every Monday, Wednesday,  and Friday. Swallow whole. 10/25/23   Rilla Baller, MD  Cholecalciferol (VITAMIN D3) 50 MCG (2000 UT) capsule Take 1 capsule (2,000 Units total) by mouth daily. 09/25/23   Rilla Baller, MD  cyanocobalamin  (VITAMIN B12) 1000 MCG tablet Take 1 tablet (1,000 mcg total) by mouth once a week. 09/25/23   Rilla Baller, MD  EPINEPHrine  (EPIPEN  2-PAK) 0.3 mg/0.3 mL IJ SOAJ injection Inject 0.3 mg into the muscle as needed for anaphylaxis. 09/25/23   Rilla Baller, MD  Glucosamine HCl 1000 MG TABS Take 1 tablet (1,000 mg total) by mouth in the morning and at bedtime. 10/25/23   Rilla Baller, MD  Multiple Vitamins-Minerals (MULTIVITAMIN ADULT PO) Take 1 capsule by mouth  daily.    [provider]  Omega-3 Fatty Acids (FISH OIL ) 1000 MG CAPS Take 1 capsule (1,000 mg total) by mouth in the morning and at bedtime. 09/25/23   Rilla Baller, MD  predniSONE  (DELTASONE ) 20 MG tablet 2 tabs po daily for 5 days, then 1 tab po daily for 5 days 06/19/24   Copland, Jacques, MD    Family History  Problem Relation Age of Onset   Arthritis Mother        knee   Alcohol abuse Father    Alcohol abuse Brother    Cancer Brother        lung (smoker)   Cancer Brother        colon   Hypertension Brother    CAD Brother        CABG (smoker, EtOH)   Myelodysplastic syndrome Brother    Peripheral Artery Disease Brother        foot amputation   Diabetes Neg Hx    Colon polyps Neg Hx    Esophageal cancer Neg Hx    Stomach cancer Neg Hx    Rectal cancer Neg Hx      Social History   Tobacco Use   Smoking status: Former    Current packs/day: 0.00    Types: Cigarettes    Quit date: 11/14/1989    Years since quitting: 34.6   Smokeless tobacco: Never  Vaping Use   Vaping status: Never Used  Substance Use Topics   Alcohol use: Yes    Alcohol/week: 28.0 standard drinks of alcohol    Types: 28 Shots of liquor per week    Comment: bourbon   Drug use: No    Allergies as of 06/27/2024 - Review Complete 06/27/2024  Allergen Reaction Noted   Hymenoptera venom preparations Anaphylaxis and Hives 06/14/2021    Review of Systems:    All systems reviewed and negative except where noted in HPI.   Physical Exam:  BP 124/78   Pulse 65   Ht 5' 11.5 (1.816 m)   Wt 238 lb (108 kg)   BMI 32.73 kg/m  No LMP for male patient.  General:   Alert,  Well-developed, well-nourished, pleasant and cooperative in NAD Lungs:  Respirations even and unlabored.  Clear throughout to auscultation.   No wheezes, crackles, or rhonchi. No acute distress. Heart:  Regular rate and rhythm; no murmurs, clicks, rubs, or gallops. Abdomen:  Normal bowel sounds.  No bruits.  Soft, and  non-distended without masses, hepatosplenomegaly or hernias noted.  No Tenderness.  No guarding or rebound tenderness.    Rectal: No external hemorrhoids, or anal fissures.  No rectal rashes.  Normal sphincter tone.  No rectal masses.  Hemoccult negative brown stool. Anoscopy: Visible swollen internal hemorrhoids just inside the rectum.  Not bleeding.  No rectal masses. Neurologic:  Alert and oriented x3;  grossly normal neurologically. Psych:  Alert and cooperative. Normal mood and affect.  Chaperone for Exam:  Alethea Blocker, CMA   Imaging Studies: No results found.  Labs: CBC    Component Value Date/Time   WBC 9.4 06/27/2024 1453   RBC 4.90 06/27/2024 1453   HGB 16.5 06/27/2024 1453   HCT 49.1 06/27/2024 1453   PLT 282.0 06/27/2024 1453   MCV 100.3 (H) 06/27/2024 1453    CMP     Component Value Date/Time   NA 142 09/18/2023 0825   K 4.3 09/18/2023 0825   CL 107 09/18/2023 0825   CO2 28 09/18/2023 0825   GLUCOSE 84 09/18/2023 0825   BUN 13 09/18/2023 0825   CREATININE 0.93 09/18/2023 0825   CALCIUM 9.0 09/18/2023 0825   PROT 7.0 09/18/2023 0825   ALBUMIN 4.1 09/18/2023 0825   AST 20 09/18/2023 0825   ALT 16 09/18/2023 0825   ALKPHOS 63 09/18/2023 0825   BILITOT 0.5 09/18/2023 0825   GFRNONAA >60 06/09/2021 0927    Assessment and Plan:   Patrick Sawyer is a 83 y.o. y/o male has been referred for:  1.  Rectal bleeding 2.  Internal hemorrhoids  Plan: - Labs: CBC - Screen for Anemia - Rx Hydrocortisone  Suppositories 25mg  Insert 1 into rectum once daily at bedtime for 12 days, 1 RF. -  Stressed importance of treating underlying constipation.  He can take OTC Colace stool softener if needed. - Avoid Sitting on the toilet for prolonged amount of time. - Discussed Internal Hemorrhoid Banding if no improvement with conservative treament.  - If rectal bleeding returns or persists in spite of using hydrocortisone  suppository, then schedule internal hemorrhoid  banding with Dr. Albertus.  Patient can call back and schedule procedure if needed.  Follow up as needed if symptoms worsen or persist.  Ellouise Console, PA-C

## 2024-06-27 ENCOUNTER — Other Ambulatory Visit (INDEPENDENT_AMBULATORY_CARE_PROVIDER_SITE_OTHER)

## 2024-06-27 ENCOUNTER — Encounter: Payer: Self-pay | Admitting: Physician Assistant

## 2024-06-27 ENCOUNTER — Ambulatory Visit: Payer: Self-pay | Admitting: Physician Assistant

## 2024-06-27 ENCOUNTER — Ambulatory Visit (INDEPENDENT_AMBULATORY_CARE_PROVIDER_SITE_OTHER): Admitting: Physician Assistant

## 2024-06-27 VITALS — BP 124/78 | HR 65 | Ht 71.5 in | Wt 238.0 lb

## 2024-06-27 DIAGNOSIS — K625 Hemorrhage of anus and rectum: Secondary | ICD-10-CM

## 2024-06-27 DIAGNOSIS — K648 Other hemorrhoids: Secondary | ICD-10-CM | POA: Diagnosis not present

## 2024-06-27 LAB — CBC WITH DIFFERENTIAL/PLATELET
Basophils Absolute: 0 K/uL (ref 0.0–0.1)
Basophils Relative: 0.4 % (ref 0.0–3.0)
Eosinophils Absolute: 0 K/uL (ref 0.0–0.7)
Eosinophils Relative: 0.4 % (ref 0.0–5.0)
HCT: 49.1 % (ref 39.0–52.0)
Hemoglobin: 16.5 g/dL (ref 13.0–17.0)
Lymphocytes Relative: 11.2 % — ABNORMAL LOW (ref 12.0–46.0)
Lymphs Abs: 1.1 K/uL (ref 0.7–4.0)
MCHC: 33.6 g/dL (ref 30.0–36.0)
MCV: 100.3 fl — ABNORMAL HIGH (ref 78.0–100.0)
Monocytes Absolute: 0.5 K/uL (ref 0.1–1.0)
Monocytes Relative: 4.9 % (ref 3.0–12.0)
Neutro Abs: 7.8 K/uL — ABNORMAL HIGH (ref 1.4–7.7)
Neutrophils Relative %: 83.1 % — ABNORMAL HIGH (ref 43.0–77.0)
Platelets: 282 K/uL (ref 150.0–400.0)
RBC: 4.9 Mil/uL (ref 4.22–5.81)
RDW: 13.6 % (ref 11.5–15.5)
WBC: 9.4 K/uL (ref 4.0–10.5)

## 2024-06-27 MED ORDER — HYDROCORTISONE ACETATE 25 MG RE SUPP: 25.0000 mg | Freq: Every day | RECTAL | 1 refills | Status: AC

## 2024-06-27 NOTE — Patient Instructions (Addendum)
 Your provider has requested that you go to the basement level for lab work before leaving today. Press B on the elevator. The lab is located at the first door on the left as you exit the elevator.  We have sent the following medications to your pharmacy for you to pick up at your convenience: Hydrocortisone  Suppositories 25mg  for 24 nights  Please call or send us  a MyChart message letting us  know if the Hydrocortisone  Suppositories are helping.  Please follow up sooner if symptoms increase or worsen  Due to recent changes in healthcare laws, you may see the results of your imaging and laboratory studies on MyChart before your provider has had a chance to review them.  We understand that in some cases there may be results that are confusing or concerning to you. Not all laboratory results come back in the same time frame and the provider may be waiting for multiple results in order to interpret others.  Please give us  48 hours in order for your provider to thoroughly review all the results before contacting the office for clarification of your results.   Thank you for trusting me with your gastrointestinal care!   Ellouise Console, PA-C _______________________________________________________  If your blood pressure at your visit was 140/90 or greater, please contact your primary care physician to follow up on this.  _______________________________________________________  If you are age 33 or older, your body mass index should be between 23-30. Your Body mass index is 32.73 kg/m. If this is out of the aforementioned range listed, please consider follow up with your Primary Care Provider.  If you are age 61 or younger, your body mass index should be between 19-25. Your Body mass index is 32.73 kg/m. If this is out of the aformentioned range listed, please consider follow up with your Primary Care Provider.   ________________________________________________________  The [facility] providers  would like to encourage you to use MYCHART to communicate with providers for non-urgent requests or questions.  Due to long hold times on the telephone, sending your provider a message by [facility] may be a faster and more efficient way to get a response.  Please allow 48 business hours for a response.  Please remember that this is for non-urgent requests.  _______________________________________________________

## 2024-09-15 ENCOUNTER — Other Ambulatory Visit: Payer: Self-pay | Admitting: Family Medicine

## 2024-09-15 DIAGNOSIS — E785 Hyperlipidemia, unspecified: Secondary | ICD-10-CM

## 2024-09-15 DIAGNOSIS — R3129 Other microscopic hematuria: Secondary | ICD-10-CM

## 2024-09-15 DIAGNOSIS — E538 Deficiency of other specified B group vitamins: Secondary | ICD-10-CM

## 2024-09-15 DIAGNOSIS — I10 Essential (primary) hypertension: Secondary | ICD-10-CM

## 2024-09-18 ENCOUNTER — Other Ambulatory Visit (INDEPENDENT_AMBULATORY_CARE_PROVIDER_SITE_OTHER): Payer: Medicare Other

## 2024-09-18 DIAGNOSIS — R3129 Other microscopic hematuria: Secondary | ICD-10-CM | POA: Diagnosis not present

## 2024-09-18 DIAGNOSIS — I10 Essential (primary) hypertension: Secondary | ICD-10-CM

## 2024-09-18 DIAGNOSIS — E785 Hyperlipidemia, unspecified: Secondary | ICD-10-CM

## 2024-09-18 DIAGNOSIS — E538 Deficiency of other specified B group vitamins: Secondary | ICD-10-CM | POA: Diagnosis not present

## 2024-09-18 LAB — URINALYSIS, ROUTINE W REFLEX MICROSCOPIC
Bilirubin Urine: NEGATIVE
Ketones, ur: NEGATIVE
Leukocytes,Ua: NEGATIVE
Nitrite: NEGATIVE
Specific Gravity, Urine: 1.025 (ref 1.000–1.030)
Total Protein, Urine: NEGATIVE
Urine Glucose: NEGATIVE
Urobilinogen, UA: 0.2 (ref 0.0–1.0)
pH: 5.5 (ref 5.0–8.0)

## 2024-09-18 LAB — LIPID PANEL
Cholesterol: 231 mg/dL — ABNORMAL HIGH (ref 0–200)
HDL: 78.7 mg/dL (ref >39.00)
LDL Cholesterol: 131 mg/dL — ABNORMAL HIGH (ref 0–99)
NonHDL: 152.19
Total CHOL/HDL Ratio: 3
Triglycerides: 106 mg/dL (ref 0.0–149.0)
VLDL: 21.2 mg/dL (ref 0.0–40.0)

## 2024-09-18 LAB — COMPREHENSIVE METABOLIC PANEL WITH GFR
ALT: 24 U/L (ref 0–53)
AST: 24 U/L (ref 0–37)
Albumin: 4.4 g/dL (ref 3.5–5.2)
Alkaline Phosphatase: 61 U/L (ref 39–117)
BUN: 15 mg/dL (ref 6–23)
CO2: 29 meq/L (ref 19–32)
Calcium: 9.1 mg/dL (ref 8.4–10.5)
Chloride: 106 meq/L (ref 96–112)
Creatinine, Ser: 0.96 mg/dL (ref 0.40–1.50)
GFR: 74.2 mL/min (ref >60.00)
Glucose, Bld: 83 mg/dL (ref 70–99)
Potassium: 4.3 meq/L (ref 3.5–5.1)
Sodium: 143 meq/L (ref 135–145)
Total Bilirubin: 0.6 mg/dL (ref 0.2–1.2)
Total Protein: 7 g/dL (ref 6.0–8.3)

## 2024-09-18 LAB — MICROALBUMIN / CREATININE URINE RATIO
Creatinine,U: 140.9 mg/dL
Microalb Creat Ratio: 19.2 mg/g (ref 0.0–30.0)
Microalb, Ur: 2.7 mg/dL — ABNORMAL HIGH (ref 0.0–1.9)

## 2024-09-19 ENCOUNTER — Ambulatory Visit: Payer: Self-pay | Admitting: Family Medicine

## 2024-09-19 DIAGNOSIS — H43811 Vitreous degeneration, right eye: Secondary | ICD-10-CM | POA: Diagnosis not present

## 2024-09-19 DIAGNOSIS — H353112 Nonexudative age-related macular degeneration, right eye, intermediate dry stage: Secondary | ICD-10-CM | POA: Diagnosis not present

## 2024-09-19 DIAGNOSIS — H2511 Age-related nuclear cataract, right eye: Secondary | ICD-10-CM | POA: Diagnosis not present

## 2024-09-19 DIAGNOSIS — H40001 Preglaucoma, unspecified, right eye: Secondary | ICD-10-CM | POA: Diagnosis not present

## 2024-09-19 LAB — VITAMIN B12: Vitamin B-12: 405 pg/mL (ref 211–911)

## 2024-09-25 ENCOUNTER — Encounter: Payer: Self-pay | Admitting: Family Medicine

## 2024-09-25 ENCOUNTER — Ambulatory Visit: Payer: Medicare Other | Admitting: Family Medicine

## 2024-09-25 VITALS — BP 130/62 | HR 58 | Temp 97.6°F | Ht 71.5 in | Wt 238.5 lb

## 2024-09-25 DIAGNOSIS — Z7189 Other specified counseling: Secondary | ICD-10-CM

## 2024-09-25 DIAGNOSIS — E538 Deficiency of other specified B group vitamins: Secondary | ICD-10-CM

## 2024-09-25 DIAGNOSIS — Z23 Encounter for immunization: Secondary | ICD-10-CM | POA: Diagnosis not present

## 2024-09-25 DIAGNOSIS — F1090 Alcohol use, unspecified, uncomplicated: Secondary | ICD-10-CM | POA: Diagnosis not present

## 2024-09-25 DIAGNOSIS — E785 Hyperlipidemia, unspecified: Secondary | ICD-10-CM

## 2024-09-25 DIAGNOSIS — I10 Essential (primary) hypertension: Secondary | ICD-10-CM

## 2024-09-25 DIAGNOSIS — S0572XS Avulsion of left eye, sequela: Secondary | ICD-10-CM

## 2024-09-25 DIAGNOSIS — Z0000 Encounter for general adult medical examination without abnormal findings: Secondary | ICD-10-CM | POA: Diagnosis not present

## 2024-09-25 MED ORDER — AMLODIPINE BESYLATE 2.5 MG PO TABS: 2.5000 mg | ORAL_TABLET | Freq: Every day | ORAL | 3 refills | Status: AC

## 2024-09-25 NOTE — Assessment & Plan Note (Signed)
 Chronic, stable on once weekly vit B12 replacement.

## 2024-09-25 NOTE — Progress Notes (Signed)
 Ph: [phone] Fax: [phone]   Patient ID: Patrick Sawyer, male    DOB: 1941-09-30, 83 y.o.   MRN: [id number]  This visit was conducted in person.  BP 130/62   Pulse (!) 58   Temp 97.6 F (36.4 C) (Oral)   Ht 5' 11.5 (1.816 m)   Wt 238 lb 8 oz (108.2 kg)   SpO2 98%   BMI 32.80 kg/m    CC: AMW Subjective:   HPI: Patrick Sawyer is a 83 y.o. male presenting on 09/25/2024 for Annual Exam   Did not see health advisor this year.   No results found.  Flowsheet Row Office Visit from 09/25/2024 in [facility] [place]  PHQ-2 Total Score 0       09/25/2024    9:38 AM 06/19/2024   10:54 AM 05/01/2024    9:58 AM 09/13/2023    8:06 AM 03/14/2023   11:54 AM  Fall Risk   Falls in the past year? 0 0 0 0 0  Number falls in past yr: 0 0  0   Injury with Fall? 0 0  0   Risk for fall due to : No Fall Risks No Fall Risks  No Fall Risks   Follow up Falls evaluation completed   Falls prevention discussed   Oldest brother recently diagnosed with myelodysplastic syndrome.   Episode of rectal bleeding attributed to int hem, saw GI s/p hydrocortisone  suppository treatment with benefit, but recently started recurrent bleed. Has appt next month to discuss hemorrhoid banding.    Preventative: COLONOSCOPY 01/2015 benign HP polyp, pt thinks rec rpt 5 yrs Una) COLONOSCOPY 02/2020 - 1 HP, diverticulosis, int hem (Pyrtle) Prostate cancer screening - has age out  Lung cancer screening - quit smoking 1991. Not eligible.  Flu shot yearly  COVID vaccine - 12/2019, 01/2020, booster 08/2020, bivalent booster 07/2021, 03/2022, 2024, 2025 Td 2009 Pneumovax 06/2016, prevnar-13 2016, prevnar-20 today Zostavax 2007 Shingrix - 12/2020, completed will check with pharmacy  Advanced directive discussion - scanned 09/2021. Wife Donald then daughter Allyson are HCPOA. No prolonged life support if terminal condition.  Seat belt use discussed  Sunscreen use  discussed. No changing moles. Sees derm yearly (Dr Hester).  Quit smoking 1991  Alcohol - drinking up to 3-4 glasses of wine/night. Gives up alcohol for lent.  Dentist Q6 mo  Eye exam - yearly Bowel - no constipation.  Bladder - no incontinence   Lives with wife Occupation: retired, was airline pilot Edu: BS accounting Activity: exercising at home with free weights  Diet: good water, fruits/vegetables daily      Relevant past medical, surgical, family and social history reviewed and updated as indicated. Interim medical history since our last visit reviewed. Allergies and medications reviewed and updated. Outpatient Medications Prior to Visit  Medication Sig Dispense Refill   aspirin  EC 81 MG tablet Take 1 tablet (81 mg total) by mouth every Monday, Wednesday, and Friday. Swallow whole.     Cholecalciferol (VITAMIN D3) 50 MCG (2000 UT) capsule Take 1 capsule (2,000 Units total) by mouth daily.     cyanocobalamin  (VITAMIN B12) 1000 MCG tablet Take 1 tablet (1,000 mcg total) by mouth once a week.     EPINEPHrine  (EPIPEN  2-PAK) 0.3 mg/0.3 mL IJ SOAJ injection Inject 0.3 mg into the muscle as needed for anaphylaxis. 2 each 1   Glucosamine HCl 1000 MG TABS Take 1 tablet (1,000 mg total) by mouth in the morning and at bedtime.  Multiple Vitamins-Minerals (MULTIVITAMIN ADULT PO) Take 1 capsule by mouth daily.     Omega-3 Fatty Acids (FISH OIL ) 1000 MG CAPS Take 1 capsule (1,000 mg total) by mouth in the morning and at bedtime.     amLODipine  (NORVASC ) 2.5 MG tablet Take 1 tablet (2.5 mg total) by mouth daily. 90 tablet 3   predniSONE  (DELTASONE ) 20 MG tablet 2 tabs po daily for 5 days, then 1 tab po daily for 5 days (Patient taking differently: 2 tabs po daily for 5 days, then 1 tab po daily for 5 days, pt states he is finishing this Rx up tomorrow) 15 tablet 0   No facility-administered medications prior to visit.     Per HPI unless specifically indicated in ROS section below Review of  Systems  Objective:  BP 130/62   Pulse (!) 58   Temp 97.6 F (36.4 C) (Oral)   Ht 5' 11.5 (1.816 m)   Wt 238 lb 8 oz (108.2 kg)   SpO2 98%   BMI 32.80 kg/m   Wt Readings from Last 3 Encounters:  09/25/24 238 lb 8 oz (108.2 kg)  06/27/24 238 lb (108 kg)  06/19/24 239 lb 4 oz (108.5 kg)      Physical Exam Vitals and nursing note reviewed.  Constitutional:      General: He is not in acute distress.    Appearance: Normal appearance. He is well-developed. He is not ill-appearing.  HENT:     Head: Normocephalic and atraumatic.     Right Ear: Hearing, tympanic membrane, ear canal and external ear normal.     Left Ear: Hearing, tympanic membrane, ear canal and external ear normal.     Mouth/Throat:     Mouth: Mucous membranes are moist.     Pharynx: Oropharynx is clear. No oropharyngeal exudate or posterior oropharyngeal erythema.  Eyes:     General: No scleral icterus.    Extraocular Movements: Extraocular movements intact.     Conjunctiva/sclera: Conjunctivae normal.     Pupils: Pupils are equal, round, and reactive to light.  Neck:     Thyroid : No thyroid  mass or thyromegaly.     Vascular: No carotid bruit.  Cardiovascular:     Rate and Rhythm: Normal rate and regular rhythm.     Pulses: Normal pulses.          Radial pulses are 2+ on the right side and 2+ on the left side.     Heart sounds: Normal heart sounds. No murmur heard. Pulmonary:     Effort: Pulmonary effort is normal. No respiratory distress.     Breath sounds: Normal breath sounds. No wheezing, rhonchi or rales.  Abdominal:     General: Bowel sounds are normal. There is no distension.     Palpations: Abdomen is soft. There is no mass.     Tenderness: There is no abdominal tenderness. There is no guarding or rebound.     Hernia: No hernia is present.  Musculoskeletal:        General: Normal range of motion.     Cervical back: Normal range of motion and neck supple.     Right lower leg: No edema.     Left  lower leg: No edema.  Lymphadenopathy:     Cervical: No cervical adenopathy.  Skin:    General: Skin is warm and dry.     Findings: No rash.  Neurological:     General: No focal deficit present.     Mental Status:  He is alert and oriented to person, place, and time.     Comments:  Recall 3/3 Calculation 5/5 DLROW  Psychiatric:        Mood and Affect: Mood normal.        Behavior: Behavior normal.        Thought Content: Thought content normal.        Judgment: Judgment normal.       Results for orders placed or performed in visit on 09/18/24  Vitamin B12   Collection Time: 09/18/24  8:07 AM  Result Value Ref Range   Vitamin B-12 405 211 - 911 pg/mL  Microalbumin / creatinine urine ratio   Collection Time: 09/18/24  8:07 AM  Result Value Ref Range   Microalb, Ur 2.7 (H) 0.0 - 1.9 mg/dL   Creatinine,U 859.0 mg/dL   Microalb Creat Ratio 19.2 0.0 - 30.0 mg/g  Comprehensive metabolic panel with GFR   Collection Time: 09/18/24  8:07 AM  Result Value Ref Range   Sodium 143 135 - 145 mEq/L   Potassium 4.3 3.5 - 5.1 mEq/L   Chloride 106 96 - 112 mEq/L   CO2 29 19 - 32 mEq/L   Glucose, Bld 83 70 - 99 mg/dL   BUN 15 6 - 23 mg/dL   Creatinine, Ser 9.03 0.40 - 1.50 mg/dL   Total Bilirubin 0.6 0.2 - 1.2 mg/dL   Alkaline Phosphatase 61 39 - 117 U/L   AST 24 0 - 37 U/L   ALT 24 0 - 53 U/L   Total Protein 7.0 6.0 - 8.3 g/dL   Albumin 4.4 3.5 - 5.2 g/dL   GFR 25.79 >39.99 mL/min   Calcium 9.1 8.4 - 10.5 mg/dL  Lipid panel   Collection Time: 09/18/24  8:07 AM  Result Value Ref Range   Cholesterol 231 (H) 0 - 200 mg/dL   Triglycerides 893.9 0.0 - 149.0 mg/dL   HDL 21.29 >60.99 mg/dL   VLDL 78.7 0.0 - 59.9 mg/dL   LDL Cholesterol 868 (H) 0 - 99 mg/dL   Total CHOL/HDL Ratio 3    NonHDL 152.19   Urinalysis, Routine w reflex microscopic   Collection Time: 09/18/24  8:07 AM  Result Value Ref Range   Color, Urine YELLOW Yellow;Lt. Yellow;Straw;Dark Yellow;Amber;Green;Red;Brown    APPearance CLEAR Clear;Turbid;Slightly Cloudy;Cloudy   Specific Gravity, Urine 1.025 1.000 - 1.030   pH 5.5 5.0 - 8.0   Total Protein, Urine NEGATIVE Negative   Urine Glucose NEGATIVE Negative   Ketones, ur NEGATIVE Negative   Bilirubin Urine NEGATIVE Negative   Hgb urine dipstick SMALL (A) Negative   Urobilinogen, UA 0.2 0.0 - 1.0   Leukocytes,Ua NEGATIVE Negative   Nitrite NEGATIVE Negative   WBC, UA 0-2/hpf 0-2/hpf   RBC / HPF 0-2/hpf 0-2/hpf   Mucus, UA Presence of (A) None   Squamous Epithelial / HPF Rare(0-4/hpf) Rare(0-4/hpf)   Uric Acid Crys, UA Presence of (A) None   Lab Results  Component Value Date   WBC 9.4 06/27/2024   HGB 16.5 06/27/2024   HCT 49.1 06/27/2024   MCV 100.3 (H) 06/27/2024   PLT 282.0 06/27/2024    Assessment & Plan:   Problem List Items Addressed This Visit     Advanced care planning/counseling discussion (Chronic)   Previously discussed      Medicare annual wellness visit, subsequent - Primary (Chronic)   I have personally reviewed the Medicare Annual Wellness questionnaire and have noted 1. The patient's medical and social history 2. Their  use of alcohol, tobacco or illicit drugs 3. Their current medications and supplements 4. The patient's functional ability including ADL's, fall risks, home safety risks and hearing or visual impairment. Cognitive function has been assessed and addressed as indicated.  5. Diet and physical activity 6. Evidence for depression or mood disorders The patients weight, height, BMI have been recorded in the chart. I have made referrals, counseling and provided education to the patient based on review of the above and I have provided the pt with a written personalized care plan for preventive services. Provider list updated.. See scanned questionairre as needed for further documentation. Reviewed preventative protocols and updated unless pt declined.       Alcohol use   Had increased to 3-4 glasses wine/night -  rec limit to 1-2.  He stops alcohol during lent.       Traumatic enucleation of left eye   HLD (hyperlipidemia)   Chronic, off statin. He continues fish oil  bid.       Relevant Medications   amLODipine  (NORVASC ) 2.5 MG tablet   Low serum vitamin B12   Chronic, stable on once weekly vit B12 replacement.       Hypertension   Chronic, adequate based on office readings - continue amlodipine  2.5mg  daily.       Relevant Medications   amLODipine  (NORVASC ) 2.5 MG tablet   Other Visit Diagnoses       Need for vaccination against Streptococcus pneumoniae       Relevant Orders   Pneumococcal conjugate vaccine 20-valent (Prevnar 20) (Completed)        Meds ordered this encounter  Medications   amLODipine  (NORVASC ) 2.5 MG tablet    Sig: Take 1 tablet (2.5 mg total) by mouth daily.    Dispense:  90 tablet    Refill:  3    Orders Placed This Encounter  Procedures   Pneumococcal conjugate vaccine 20-valent (Prevnar 20)    Patient Instructions  Prevnar-20 today Send us  date of 2nd shingrix shot (at local pharmacy) Cut down on alcohol use to no more than 1-2 glasses/day.  Good to see you today  Return as needed or in 1 year for next medicare wellness visit   Follow up plan: Return in about 1 year (around 09/25/2025) for medicare wellness visit.  Anton Blas, MD

## 2024-09-25 NOTE — Assessment & Plan Note (Addendum)
 Chronic, off statin. He continues fish oil  bid.

## 2024-09-25 NOTE — Assessment & Plan Note (Signed)
 Had increased to 3-4 glasses wine/night - rec limit to 1-2.  He stops alcohol during lent.

## 2024-09-25 NOTE — Assessment & Plan Note (Signed)
 Previously discussed.

## 2024-09-25 NOTE — Patient Instructions (Addendum)
 Prevnar-20 today Send us  date of 2nd shingrix shot (at local pharmacy) Cut down on alcohol use to no more than 1-2 glasses/day.  Good to see you today  Return as needed or in 1 year for next medicare wellness visit

## 2024-09-25 NOTE — Assessment & Plan Note (Signed)
 Chronic, adequate based on office readings - continue amlodipine  2.5mg  daily.

## 2024-09-25 NOTE — Assessment & Plan Note (Signed)

## 2024-11-08 ENCOUNTER — Ambulatory Visit: Admitting: Physician Assistant

## 2024-11-08 ENCOUNTER — Encounter: Payer: Self-pay | Admitting: Physician Assistant

## 2024-11-08 VITALS — BP 128/82 | HR 93 | Ht 72.0 in | Wt 240.4 lb

## 2024-11-08 DIAGNOSIS — K648 Other hemorrhoids: Secondary | ICD-10-CM | POA: Diagnosis not present

## 2024-11-08 DIAGNOSIS — K625 Hemorrhage of anus and rectum: Secondary | ICD-10-CM | POA: Diagnosis not present

## 2024-11-08 MED ORDER — HYDROCORTISONE ACETATE 25 MG RE SUPP: 25.0000 mg | Freq: Every day | RECTAL | 1 refills | Status: AC

## 2024-11-08 MED ORDER — NA SULFATE-K SULFATE-MG SULF 17.5-3.13-1.6 GM/177ML PO SOLN: 1.0000 | Freq: Once | ORAL | 0 refills | Status: AC

## 2024-11-08 NOTE — Progress Notes (Signed)
 "     Ellouise Console, PA-C [address] [place], [place]  [address] Phone: [phone]   Primary Care Physician: Rilla Baller, MD  Primary Gastroenterologist:  Ellouise Console, PA-C / Dr. Gordy Starch   Chief Complaint:  F/U Hemorrhoid Bleeding       HPI:   Discussed the use of AI scribe software for clinical note transcription with the patient, who gave verbal consent to proceed.  Imraan Wendell Izaiha Lo is an 83 year old male with internal hemorrhoids and prior hemorrhoid banding who presents for evaluation of recurrent rectal bleeding.  I last saw patient 06/2024 for Bleeding Hemorrhoids.  CBC was normal with Hgb 16.5g.  He was given Rx hydrocortisone  25mg  suppositories with benefit.  Rectal bleeding stopped for 3-4 weeks and then returned after he did some yard work and heavy physical activity.  He is here today with his wife.  Patient would like to pursue repeat colonoscopy and repeat internal hemorrhoid banding.  Takes 81 mg aspirin  daily.  No other blood thinners.  02/2020 last colonoscopy by Dr. Starch: 1 small 6 mm hyperplastic polyp removed.  Diverticulosis.  Internal hemorrhoids.  No repeat colonoscopy was recommended due to advanced age.   01/2015 colonoscopy by Dr. Viktoria: 5 diminutive polyps removed.  Pathology showed hyperplastic and colon mucosal polyps.   06/2012 colonoscopy by Dr. Viktoria 2 small (3 mm and 9 mm) polyps removed.  Pathology showed 1 tubular adenoma and 1 sessile serrated adenoma.  History of Present Illness Rectal bleeding - Recurrent rectal bleeding initially recurred prior to last visit. - Minor bleeding episodes have occurred intermittently since August 2025, typically during bowel movements. - One episode of heavy rectal bleeding occurred in early November 2025 without a clear precipitating factor. - Bleeding episodes are typically painless and unexpected. - No further bleeding since the heavy episode in early November 2025. - Increased  frequency of bleeding associated with periods of increased physical activity, such as yard work and cutting grass.  Hemorrhoidal disease - Internal hemorrhoids previously treated with banding approximately six to seven years ago, with five hemorrhoids banded. - No issues following banding until recent recurrence of rectal bleeding. - Hydrocortisone  suppositories led to cessation of bleeding for three to four weeks, followed by recurrent intermittent minor bleeding.  Gastrointestinal symptoms - No constipation, diarrhea, weight loss, or abdominal pain.     Current Outpatient Medications  Medication Sig Dispense Refill   amLODipine  (NORVASC ) 2.5 MG tablet Take 1 tablet (2.5 mg total) by mouth daily. 90 tablet 3   aspirin  EC 81 MG tablet Take 1 tablet (81 mg total) by mouth every Monday, Wednesday, and Friday. Swallow whole.     Cholecalciferol (VITAMIN D3) 50 MCG (2000 UT) capsule Take 1 capsule (2,000 Units total) by mouth daily.     cyanocobalamin  (VITAMIN B12) 1000 MCG tablet Take 1 tablet (1,000 mcg total) by mouth once a week.     EPINEPHrine  (EPIPEN  2-PAK) 0.3 mg/0.3 mL IJ SOAJ injection Inject 0.3 mg into the muscle as needed for anaphylaxis. 2 each 1   Glucosamine HCl 1000 MG TABS Take 1 tablet (1,000 mg total) by mouth in the morning and at bedtime.     hydrocortisone  (ANUSOL -HC) 25 MG suppository Place 1 suppository (25 mg total) rectally at bedtime for 24 days. 12 suppository 1   Multiple Vitamins-Minerals (MULTIVITAMIN ADULT PO) Take 1 capsule by mouth daily.     Omega-3 Fatty Acids (FISH OIL ) 1000 MG CAPS Take 1 capsule (1,000 mg  total) by mouth in the morning and at bedtime.     No current facility-administered medications for this visit.    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, 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   HLD (hyperlipidemia)    Obesity, Class I, BMI 30-34.9    Seasonal allergies    Traumatic enucleation of left eye     Past Surgical History:  Procedure Laterality Date   APPENDECTOMY  1959   COLONOSCOPY  01/2015   small polyps, rpt 5 yrs Una)   COLONOSCOPY  06/2012   small polyps Cathe)   COLONOSCOPY  02/2020   1 HP, diverticulosis, int hem (Pyrtle)   ENUCLEATION Left 1969   HEMORRHOID BANDING  04/2015   Dr Claudene   INGUINAL HERNIA REPAIR Right    MENISCUS REPAIR Left 2005   MOHS SURGERY  04/2014   VARICOSE VEIN SURGERY  2006    Review of Systems:    All systems reviewed and negative except where noted in HPI.    Physical Exam:  BP 128/82   Pulse 93   Ht 6' (1.829 m)   Wt 240 lb 6 oz (109 kg)   BMI 32.60 kg/m  No LMP for male patient.  General: Well-nourished, well-developed in no acute distress.  Lungs: Clear to auscultation bilaterally. Non-labored. Heart: Regular rate and rhythm, no murmurs rubs or gallops.  Abdomen: Bowel sounds are normal; Abdomen is Soft; No hepatosplenomegaly, masses or hernias;  No Abdominal Tenderness; No guarding or rebound tenderness. Neuro: Alert and oriented x 3.  Grossly intact.  Psych: Alert and cooperative, normal mood and affect.   Imaging Studies: No results found.  Labs: CBC    Component Value Date/Time   WBC 9.4 06/27/2024 1453   RBC 4.90 06/27/2024 1453   HGB 16.5 06/27/2024 1453   HCT 49.1 06/27/2024 1453   PLT 282.0 06/27/2024 1453   MCV 100.3 (H) 06/27/2024 1453   MCH 34.7 (H) 06/09/2021 0927   MCHC  33.6 06/27/2024 1453   RDW 13.6 06/27/2024 1453   LYMPHSABS 1.1 06/27/2024 1453   MONOABS 0.5 06/27/2024 1453   EOSABS 0.0 06/27/2024 1453   BASOSABS 0.0 06/27/2024 1453    CMP     Component Value Date/Time   NA 143 09/18/2024 0807   K 4.3 09/18/2024 0807   CL 106 09/18/2024 0807   CO2 29 09/18/2024 0807   GLUCOSE 83 09/18/2024 0807   BUN 15 09/18/2024 0807   CREATININE 0.96 09/18/2024 0807   CALCIUM 9.1 09/18/2024 0807   PROT 7.0 09/18/2024 0807   ALBUMIN 4.4 09/18/2024 0807   AST 24 09/18/2024 0807   ALT 24 09/18/2024 0807   ALKPHOS 61 09/18/2024 0807   BILITOT 0.6 09/18/2024 0807   GFRNONAA >60 06/09/2021 0927     Assessment and Plan:  Tydarius Yawn is a 83 y.o. y/o male presents for: Assessment & Plan Internal hemorrhoids with recurrent rectal bleeding Recurrent rectal bleeding without pain or anemia. Physical activity exacerbates symptoms. Prior benign colonoscopy findings, further evaluation needed to exclude other bleeding sources. - Scheduled repeat colonoscopy to evaluate for other bleeding sources and assess for recurrent polyps or pathology. - Scheduled internal hemorrhoid banding with Dr. Albertus post-colonoscopy. - Refilled hydrocortisone  suppositories for use during bleeding episodes while awaiting procedures. - Deferred repeat blood work due to normal hemoglobin and absence of significant ongoing bleeding.  Ellouise Console, PA-C  Follow up As Needed based on Colonoscopy results and symptoms.   "

## 2024-11-08 NOTE — Patient Instructions (Addendum)
 We have sent the following medications to your pharmacy for you to pick up at your convenience: Hydrocortisone  Suppositories 25 mg at bedtime for 24 nights   You have been scheduled for a Hemorrhoid Banding procedure with Dr. Albertus on 12/26/24 at 3:40 pm. If you have any question comments or concerns please do not hesitate to give us  a call.  You have been scheduled for a Colonoscopy. Please follow written instructions given to you at your visit today.   If you use inhalers (even only as needed), please bring them with you on the day of your procedure.  DO NOT TAKE 7 DAYS PRIOR TO TEST- Trulicity (dulaglutide) Ozempic, Wegovy (semaglutide) Mounjaro (tirzepatide) Bydureon Bcise (exanatide extended release)  DO NOT TAKE 1 DAY PRIOR TO YOUR TEST Rybelsus (semaglutide) Adlyxin (lixisenatide) Victoza (liraglutide) Byetta (exanatide) ___________________________________________________________________________  Please follow up sooner if symptoms increase or worsen   Due to recent changes in healthcare laws, you may see the results of your imaging and laboratory studies on MyChart before your provider has had a chance to review them.  We understand that in some cases there may be results that are confusing or concerning to you. Not all laboratory results come back in the same time frame and the provider may be waiting for multiple results in order to interpret others.  Please give us  48 hours in order for your provider to thoroughly review all the results before contacting the office for clarification of your results.   Thank you for trusting me with your gastrointestinal care!   Ellouise Console, PA-C _______________________________________________________  If your blood pressure at your visit was 140/90 or greater, please contact your primary care physician to follow up on this.  _______________________________________________________  If you are age 81 or older, your body mass index should  be between 23-30. Your Body mass index is 32.6 kg/m. If this is out of the aforementioned range listed, please consider follow up with your Primary Care Provider.  If you are age 3 or younger, your body mass index should be between 19-25. Your Body mass index is 32.6 kg/m. If this is out of the aformentioned range listed, please consider follow up with your Primary Care Provider.   ________________________________________________________  The [facility] providers would like to encourage you to use MYCHART to communicate with providers for non-urgent requests or questions.  Due to long hold times on the telephone, sending your provider a message by [facility] may be a faster and more efficient way to get a response.  Please allow 48 business hours for a response.  Please remember that this is for non-urgent requests.  _______________________________________________________

## 2024-11-09 ENCOUNTER — Encounter: Payer: Self-pay | Admitting: Physician Assistant

## 2024-11-20 ENCOUNTER — Encounter

## 2024-11-22 ENCOUNTER — Ambulatory Visit: Admitting: Internal Medicine

## 2024-11-22 ENCOUNTER — Encounter: Payer: Self-pay | Admitting: Internal Medicine

## 2024-11-22 VITALS — BP 138/72 | HR 47 | Temp 97.7°F | Resp 18 | Ht 72.0 in | Wt 240.6 lb

## 2024-11-22 DIAGNOSIS — K625 Hemorrhage of anus and rectum: Secondary | ICD-10-CM

## 2024-11-22 DIAGNOSIS — K6389 Other specified diseases of intestine: Secondary | ICD-10-CM | POA: Diagnosis not present

## 2024-11-22 DIAGNOSIS — D123 Benign neoplasm of transverse colon: Secondary | ICD-10-CM | POA: Diagnosis not present

## 2024-11-22 DIAGNOSIS — D120 Benign neoplasm of cecum: Secondary | ICD-10-CM

## 2024-11-22 DIAGNOSIS — K648 Other hemorrhoids: Secondary | ICD-10-CM | POA: Diagnosis not present

## 2024-11-22 DIAGNOSIS — D122 Benign neoplasm of ascending colon: Secondary | ICD-10-CM

## 2024-11-22 DIAGNOSIS — K5730 Diverticulosis of large intestine without perforation or abscess without bleeding: Secondary | ICD-10-CM

## 2024-11-22 MED ORDER — SODIUM CHLORIDE 0.9 % IV SOLN: 500.0000 mL | INTRAVENOUS | Status: DC

## 2024-11-22 NOTE — Op Note (Signed)
 [facility] Patient Name: Patrick Sawyer Procedure Date: 11/22/2024 2:31 PM MRN: [id number] Endoscopist: Gordy CHRISTELLA Starch , MD, [id number] Age: 84 Referring MD:  Date of Birth: [birth year] Gender: Male Account #: [id number] Procedure:                Colonoscopy Indications:              Rectal bleeding Medicines:                Monitored Anesthesia Care Procedure:                Pre-Anesthesia Assessment:                           - Prior to the procedure, a History and Physical                            was performed, and patient medications and                            allergies were reviewed. The patient's tolerance of                            previous anesthesia was also reviewed. The risks                            and benefits of the procedure and the sedation                            options and risks were discussed with the patient.                            All questions were answered, and informed consent                            was obtained. Prior Anticoagulants: The patient has                            taken no anticoagulant or antiplatelet agents. ASA                            Grade Assessment: II - A patient with mild systemic                            disease. After reviewing the risks and benefits,                            the patient was deemed in satisfactory condition to                            undergo the procedure.                           After obtaining informed consent, the colonoscope  was passed under direct vision. Throughout the                            procedure, the patient's blood pressure, pulse, and                            oxygen saturations were monitored continuously. The                            Olympus Scope [id number] was introduced through the                            anus and advanced to the cecum, identified by                            appendiceal orifice and ileocecal valve.  The                            colonoscopy was performed without difficulty. The                            patient tolerated the procedure well. The quality                            of the bowel preparation was good. The ileocecal                            valve, appendiceal orifice, and rectum were                            photographed. Scope In: 2:50:13 PM Scope Out: 3:07:15 PM Scope Withdrawal Time: 0 hours 14 minutes 27 seconds  Total Procedure Duration: 0 hours 17 minutes 2 seconds  Findings:                 The digital rectal exam was normal.                           A 2 mm polyp was found in the cecum. The polyp was                            sessile. The polyp was removed with a cold biopsy                            forceps. Resection and retrieval were complete.                           A 5 mm polyp was found in the ascending colon. The                            polyp was sessile. The polyp was removed with a                            cold  snare. Resection and retrieval were complete.                           Two sessile polyps were found in the transverse                            colon. The polyps were 5 to 7 mm in size. These                            polyps were removed with a cold snare. Resection                            and retrieval were complete.                           Multiple medium-mouthed and small-mouthed                            diverticula were found in the sigmoid colon,                            descending colon and hepatic flexure.                           A post hemorrhoidal banding scar was found in the                            distal rectum.                           Internal hemorrhoids were found during                            retroflexion. The hemorrhoids were medium-sized.                            Hemorrhoids present in the LL and RP positions. Complications:            No immediate complications. Estimated Blood Loss:      Estimated blood loss was minimal. Impression:               - One 2 mm polyp in the cecum, removed with a cold                            biopsy forceps. Resected and retrieved.                           - One 5 mm polyp in the ascending colon, removed                            with a cold snare. Resected and retrieved.                           - Two 5 to 7 mm polyps in the transverse colon,  removed with a cold snare. Resected and retrieved.                           - Mild diverticulosis in the sigmoid colon, in the                            descending colon and at the hepatic flexure.                           - Scars in the distal rectum from prior band                            ligation.                           - Internal hemorrhoids (left lateral and right                            posterior). Recommendation:           - Patient has a contact number available for                            emergencies. The signs and symptoms of potential                            delayed complications were discussed with the                            patient. Return to normal activities tomorrow.                            Written discharge instructions were provided to the                            patient.                           - Resume previous diet.                           - Continue present medications.                           - Await pathology results.                           - No recommendation at this time regarding repeat                            colonoscopy due to age. Gordy CHRISTELLA Starch, MD 11/22/2024 3:11:47 PM This report has been signed electronically.

## 2024-11-22 NOTE — Progress Notes (Signed)
 Called to room to assist during endoscopic procedure.  Patient ID and intended procedure confirmed with present staff. Received instructions for my participation in the procedure from the performing physician.

## 2024-11-22 NOTE — Progress Notes (Signed)
 See office note dated 11/08/2024 for details and current H&P.  Colonoscopy today for recurrent rectal bleeding to exclude pathology other than hemorrhoidal disease, personal history of sessile serrated and adenomatous colonic polyps  He remains appropriate for colonoscopy in the [facility] today.

## 2024-11-22 NOTE — Patient Instructions (Signed)
" ° °  Handouts on polyps,diverticulosis,& hemorrhoids given to you today.   Await pathology results on polyps removed   Continue previous diet & medication   YOU HAD AN ENDOSCOPIC PROCEDURE TODAY AT THE [facility]:   Refer to the procedure report that was given to you for any specific questions about what was found during the examination.  If the procedure report does not answer your questions, please call your gastroenterologist to clarify.  If you requested that your care partner not be given the details of your procedure findings, then the procedure report has been included in a sealed envelope for you to review at your convenience later.  YOU SHOULD EXPECT: Some feelings of bloating in the abdomen. Passage of more gas than usual.  Walking can help get rid of the air that was put into your GI tract during the procedure and reduce the bloating. If you had a lower endoscopy (such as a colonoscopy or flexible sigmoidoscopy) you may notice spotting of blood in your stool or on the toilet paper. If you underwent a bowel prep for your procedure, you may not have a normal bowel movement for a few days.  Please Note:  You might notice some irritation and congestion in your nose or some drainage.  This is from the oxygen used during your procedure.  There is no need for concern and it should clear up in a day or so.  SYMPTOMS TO REPORT IMMEDIATELY:  Following lower endoscopy (colonoscopy or flexible sigmoidoscopy):  Excessive amounts of blood in the stool  Significant tenderness or worsening of abdominal pains  Swelling of the abdomen that is new, acute  Fever of 100F or higher   For urgent or emergent issues, a gastroenterologist can be reached at any hour by calling [phone]. Do not use MyChart messaging for urgent concerns.    DIET:  We do recommend a small meal at first, but then you may proceed to your regular diet.  Drink plenty of fluids but you should avoid alcoholic  beverages for 24 hours.  ACTIVITY:  You should plan to take it easy for the rest of today and you should NOT DRIVE or use heavy machinery until tomorrow (because of the sedation medicines used during the test).    FOLLOW UP: Our staff will call the number listed on your records the next business day following your procedure.  We will call around 7:15- 8:00 am to check on you and address any questions or concerns that you may have regarding the information given to you following your procedure. If we do not reach you, we will leave a message.     If any biopsies were taken you will be contacted by phone or by letter within the next 1-3 weeks.  Please call us  at [phone] if you have not heard about the biopsies in 3 weeks.    SIGNATURES/CONFIDENTIALITY: You and/or your care partner have signed paperwork which will be entered into your electronic medical record.  These signatures attest to the fact that that the information above on your After Visit Summary has been reviewed and is understood.  Full responsibility of the confidentiality of this discharge information lies with you and/or your care-partner. "

## 2024-11-22 NOTE — Progress Notes (Signed)
 To pacu, VSS. Report to [email or web address]

## 2024-11-22 NOTE — Progress Notes (Signed)
 Pt's states no medical or surgical changes since previsit or office visit.

## 2024-11-25 ENCOUNTER — Telehealth: Payer: Self-pay

## 2024-11-25 NOTE — Telephone Encounter (Signed)
 No answer on follow up call - voice mail message left

## 2024-11-28 LAB — SURGICAL PATHOLOGY

## 2024-11-29 ENCOUNTER — Ambulatory Visit: Payer: Self-pay | Admitting: Internal Medicine

## 2024-12-26 ENCOUNTER — Encounter: Admitting: Internal Medicine

## 2025-01-30 ENCOUNTER — Ambulatory Visit: Admitting: Dermatology

## 2025-09-19 ENCOUNTER — Other Ambulatory Visit

## 2025-09-26 ENCOUNTER — Ambulatory Visit

## 2025-09-26 ENCOUNTER — Encounter: Admitting: Family Medicine
